# Patient Record
Sex: Male | Born: 1950
Health system: Southern US, Community
[De-identification: ages and names within clinical notes are randomized; demographics above are authoritative.]

## PROBLEM LIST (undated history)

## (undated) DIAGNOSIS — R42 Dizziness and giddiness: Secondary | ICD-10-CM

## (undated) DIAGNOSIS — T7840XA Allergy, unspecified, initial encounter: Secondary | ICD-10-CM

## (undated) DIAGNOSIS — K219 Gastro-esophageal reflux disease without esophagitis: Secondary | ICD-10-CM

## (undated) DIAGNOSIS — I10 Essential (primary) hypertension: Secondary | ICD-10-CM

## (undated) DIAGNOSIS — M1990 Unspecified osteoarthritis, unspecified site: Secondary | ICD-10-CM

## (undated) DIAGNOSIS — K921 Melena: Secondary | ICD-10-CM

## (undated) DIAGNOSIS — Z981 Arthrodesis status: Secondary | ICD-10-CM

## (undated) DIAGNOSIS — G4730 Sleep apnea, unspecified: Secondary | ICD-10-CM

## (undated) DIAGNOSIS — R0902 Hypoxemia: Secondary | ICD-10-CM

## (undated) DIAGNOSIS — I8390 Asymptomatic varicose veins of unspecified lower extremity: Secondary | ICD-10-CM

## (undated) DIAGNOSIS — R6882 Decreased libido: Secondary | ICD-10-CM

## (undated) DIAGNOSIS — F419 Anxiety disorder, unspecified: Secondary | ICD-10-CM

## (undated) DIAGNOSIS — M5412 Radiculopathy, cervical region: Secondary | ICD-10-CM

## (undated) DIAGNOSIS — C801 Malignant (primary) neoplasm, unspecified: Secondary | ICD-10-CM

## (undated) DIAGNOSIS — N529 Male erectile dysfunction, unspecified: Secondary | ICD-10-CM

## (undated) HISTORY — DX: Melena: K92.1

## (undated) HISTORY — DX: Radiculopathy, cervical region: M54.12

## (undated) HISTORY — DX: Essential (primary) hypertension: I10

## (undated) HISTORY — DX: Arthrodesis status: Z98.1

## (undated) HISTORY — DX: Male erectile dysfunction, unspecified: N52.9

## (undated) HISTORY — PX: SPINE SURGERY: SHX786

## (undated) HISTORY — DX: Sleep apnea, unspecified: G47.30

## (undated) HISTORY — DX: Unspecified osteoarthritis, unspecified site: M19.90

## (undated) HISTORY — DX: Dizziness and giddiness: R42

## (undated) HISTORY — DX: Gastro-esophageal reflux disease without esophagitis: K21.9

## (undated) HISTORY — DX: Hypoxemia: R09.02

## (undated) HISTORY — DX: Allergy, unspecified, initial encounter: T78.40XA

## (undated) HISTORY — DX: Anxiety disorder, unspecified: F41.9

## (undated) HISTORY — PX: ESOPHAGOGASTRODUODENOSCOPY ENDOSCOPY: SHX5814

## (undated) HISTORY — DX: Asymptomatic varicose veins of unspecified lower extremity: I83.90

## (undated) HISTORY — DX: Decreased libido: R68.82

---

## 1991-02-20 HISTORY — PX: CARDIAC CATHETERIZATION: SHX172

## 2003-02-20 HISTORY — PX: LAMINECTOMY: SHX219

## 2003-11-12 ENCOUNTER — Ambulatory Visit (HOSPITAL_COMMUNITY): Admission: RE | Admit: 2003-11-12 | Discharge: 2003-11-13 | Payer: Self-pay | Admitting: Neurosurgery

## 2004-01-11 ENCOUNTER — Encounter: Admission: RE | Admit: 2004-01-11 | Discharge: 2004-01-11 | Payer: Self-pay | Admitting: Neurosurgery

## 2005-05-13 ENCOUNTER — Emergency Department: Payer: Self-pay | Admitting: Emergency Medicine

## 2006-04-05 DIAGNOSIS — M545 Low back pain, unspecified: Secondary | ICD-10-CM | POA: Insufficient documentation

## 2006-05-08 ENCOUNTER — Ambulatory Visit: Payer: Self-pay

## 2007-02-20 HISTORY — PX: CERVICAL SPINE SURGERY: SHX589

## 2007-04-27 ENCOUNTER — Encounter: Admission: RE | Admit: 2007-04-27 | Discharge: 2007-04-27 | Payer: Self-pay | Admitting: Neurosurgery

## 2007-05-13 ENCOUNTER — Inpatient Hospital Stay (HOSPITAL_COMMUNITY): Admission: RE | Admit: 2007-05-13 | Discharge: 2007-05-14 | Payer: Self-pay | Admitting: Neurosurgery

## 2007-07-22 ENCOUNTER — Encounter: Admission: RE | Admit: 2007-07-22 | Discharge: 2007-07-22 | Payer: Self-pay | Admitting: Neurosurgery

## 2008-01-02 DIAGNOSIS — M5412 Radiculopathy, cervical region: Secondary | ICD-10-CM

## 2008-01-02 HISTORY — DX: Radiculopathy, cervical region: M54.12

## 2008-05-07 ENCOUNTER — Emergency Department: Payer: Self-pay | Admitting: Emergency Medicine

## 2010-01-20 ENCOUNTER — Ambulatory Visit: Payer: Self-pay

## 2010-07-04 NOTE — Op Note (Signed)
Benjamin Gutierrez, Benjamin Gutierrez NO.:  [id number]   MEDICAL RECORD NO.:  [id number]          PATIENT TYPE:  OIB   LOCATION:  3599                         FACILITY:  [facility]   PHYSICIAN:  Henry A. Pool, M.D.    DATE OF BIRTH:  [birth year]   DATE OF PROCEDURE:  05/12/2007  DATE OF DISCHARGE:                               OPERATIVE REPORT   PREOPERATIVE DIAGNOSIS:  C3-4 and C4-5 stenosis with myelopathy and  radiculopathy.   POSTOPERATIVE DIAGNOSIS:  C3-4 and C4-5 stenosis with myelopathy and  radiculopathy.   PROCEDURE NOTE:  Re-exploration of C5 through C7 anterior cervical  fusion with removal of C5 through 7 anterior plate instrumentation.  C3-  4 and C4-5 anterior cervical diskectomy and fusion with allograft and  plating.   SURGEON:  Kathaleen Maser. Pool, M.D.   ASSISTANT:  Reinaldo Meeker, M.D.   ANESTHESIA:  General endotracheal.   INDICATIONS:  Mr. Ragain is a 60 year old male with history of neck pain  and left upper extremity weakness consistent with acute left-sided C5  radiculopathy.  The patient has complete loss of his deltoid  supraspinatus and infraspinatus strength.  His biceps weakness on the  left side.  Remaining his neurological exam is intact.  Workup  demonstrates no evidence of solid fusion from C5 to C7.  The patient has  evidence of significant disk bulging and spondylosis with stenosis at C3-  4 and C4-5 and some degree of foraminal disk protrusions at C3-4 and C4-  5 on the left side.  I discussed the situation with the patient.  I have  recommended that we move forward with surgical decompression.  The  patient is aware the risks and benefits and wished to proceed.   OPERATION:  The patient was taken to the operating room, placed on the  operating table in supine position.  After adequate level of anesthesia  was achieved the patient positioned with his neck slightly extended and  held in place with halter traction.  The patient's anterior  cervical  region was prepped and draped sterilely.  A 10 blade was used make  linear skin incision overlying the C5-6 vertebral level.  This was  carried sharply to the platysma.  The platysma was divided vertically  and dissection proceeded along the medial border of the  sternocleidomastoid muscle and carotid sheath.  Trachea, esophagus were  mobilized and retracted towards the left.  Prevertebral fascia stripped  off the anterior spinal column.  Longus colli muscle was then elevated  bilaterally using electrocautery.  The previous anterior plate  instrumentation from C5 to C7 was dissected free.  The plate was then  disassembled and all screws removed.  Plate was completely removed.  Fusion from C5-C7 was found to be quite solid.  Anterior overhanging  osteophytes from the body of C4 was resected.  Disk spaces at C3-4, C4-5  were then incised with a 15 blade in rectangular fashion.  A wide disk  space clean-out was achieved using pituitary rongeurs, forward and  backward Karlen curettes, Kerrison rongeurs, and high-speed drill.  All  elements of the disk  were removed down to the level of the posterior  annulus.  Microscope was then brought to the field to use throughout the  remainder of the diskectomy.  The  remaining aspects of annulus and  osteophytes were removed using high-speed drill down to the level of the  posterior longitudinal ligament.  Posterior longitudinal ligament was  elevated and resected in piecemeal fashion using Kerrison rongeurs.  The  underlying thecal sac was identified.  Wide central decompression was  then performed by undercutting the bodies of C3 and C4.  Decompression  then proceeded out each neural foramen.  Wide anterior foraminotomies  were then performed along the course exiting C4 nerve roots bilaterally.  At this point a very thorough decompression had been achieved.  There  was no evidence of injury to thecal sac or nerve roots.  The procedure  then  repeated at C4-5 again without complication.  Findings at this  level where were that of a significant foraminal disk herniation off to  the left side at C4-5.  Life-Net allograft wedges were then packed into  place at C3-4 and C4-5.  A Atlantis anterior cervical plate was then  placed over the C3, C4 and C5 levels.  This was then attached under  fluoroscopic guidance using 13 mL variable angle screws, two each at all  three levels.  All six screws given final tightening and found to be  solid in bone.  Locking screws engaged at all three levels.  Cervical  spine images revealed good position of bone grafts and hardware at  proper operative level and normal alignment of the spine.  The wound was  then inspected for hemostasis which was found to be good.  It was then  irrigated and closed in typical fashion.  Steri-Strips and sterile  dressing were applied.  There were no apparent complications.  The  patient tolerated the procedure well and he returns to recovery room  postoperatively.           ______________________________  Kathaleen Maser Pool, M.D.     HAP/MEDQ  D:  05/12/2007  T:  05/13/2007  Job:  [id number]

## 2010-07-07 NOTE — Op Note (Signed)
Benjamin Gutierrez, Benjamin Gutierrez NO.:  [id number]   MEDICAL RECORD NO.:  [id number]          PATIENT TYPE:  OIB   LOCATION:  2899                         FACILITY:  [facility]   PHYSICIAN:  Kathaleen Maser. Pool, M.D.    DATE OF BIRTH:  [birth year]   DATE OF PROCEDURE:  11/12/2003  DATE OF DISCHARGE:                                 OPERATIVE REPORT   PREOPERATIVE DIAGNOSIS:  C5-6 and C6-7 stenosis with myelopathy.   POSTOPERATIVE DIAGNOSIS:  C5-6 and C6-7 stenosis with myelopathy.   PROCEDURE:  C5-6 and C6-7 anterior cervical decompression and fusion with  allograft and anterior plating.   SURGEON:  Kathaleen Maser. Pool, M.D.   ASSISTANT:  Reinaldo Meeker, M.D.   ANESTHESIA:  General endotracheal.   INDICATIONS:  [name] is a 60 year old male with history of neck and  bilateral upper extremity symptoms consistent with both cervical  radiculopathy and myelopathy.  Workup has demonstrated evidence of marked  spondylosis at C5-6 and C6-7 with spinal stenosis and cord compression.  The  patient presents now for two-level decompression and fusion with  instrumentation.   OPERATIVE NOTE:  The patient was taken to the operating room and placed on  the table in a supine position.  After an adequate level of anesthesia was  achieved, the patient was positioned supine with his neck slightly extended  and held in place with Holter traction.  The patient's anterior cervical  region was prepped and draped sterilely.  A 10 blade was used to make a  linear skin incision overlying the C6 vertebral level.  This was carried  down sharply to the platysma.  The platysma was then divided vertically and  dissection proceeded along the medial border of the sternocleidomastoid  muscle and carotid sheath.  Trachea and esophagus were mobilized and  retracted toward the left.  Prevertebral fascia was stripped off the  anterior spinal column.  The longus colli muscle was then elevated  bilaterally using  electrocautery.  A deep self-retaining retractor was  placed.  Intraoperative fluoroscopy was used, and the levels were confirmed.  The disk space at C5-6 and C6-7 were then both incised with a 15 blade in a  rectangular fashion.  A wide disk space clean-out was then achieved using  pituitary rongeurs, upward-angled pituitary rongeurs, and Epstein curettes.  All elements of the disk were removed down to the level of the posterior  annulus.  A microscope was brought into the field and used throughout the  remainder of the diskectomy.  The remaining aspects of the annulus and  osteophytes were removed using the high-speed drill down to the level of the  posterior longitudinal ligament, starting first at C5-6.  Posterior  longitudinal ligament was then elevated and resected in a piecemeal fashion  using Kerrison rongeurs.  The underlying thecal sac was identified.  A wide  central decompression then performed by undercutting the bodies of C5 and  C6.  Wide anterior foraminotomy was then performed along the course of the  exiting C6 nerve roots bilaterally.  All elements of the neural compression  were relieved.  There was no evidence of injury to thecal sac or nerve  roots.  The procedure was then repeated at C6-7, again without complication.  The wound was then irrigated with antibiotic solution.  Gelfoam was placed  topically for hemostasis and found to be good.  A 6 mm patellar wedge  allograft was then impacted into place, recessed approximately 1 mm from the  anterior cortical surface at C5-6, and a 7 mm patellar wedge allograft was  then packed into place and recessed approximately 1 mm from the anterior  cortical margin at C6-7.  A 42 mm Atlantis anterior cervical plate was then  placed over the C5, C6, and C7 levels.  This was then attached under  fluoroscopic guidance using 14 mm variable-angled screws, two each at C5 and  C6 and one on the left side at C7 and a 15 mm rescue screw on  the right side  at C7.  All six screws were given a final tightening and found to be solidly  within bone.  The locking screws were engaged at all three levels.  Final  images revealed good position of the bone grafts and hardware, proper  operative level, and normal alignment of the spine.  The wound was then  irrigated with antibiotic solution.  Gelfoam was placed topically.  Hemostasis was ensured with the bipolar electrocautery.  The wound was then  closed in a typical fashion.  Steri-Strips and sterile dressing were  applied.  There were no apparent complications.  The patient tolerated the  procedure well, and he returns to the recovery room postop.       HAP/MEDQ  D:  11/12/2003  T:  11/13/2003  Job:  [id number]

## 2010-11-13 LAB — CBC
HCT: 42
HCT: 48.1
Hemoglobin: 14.6
Hemoglobin: 16.4
MCHC: 34
MCHC: 34.8
MCV: 88.7
MCV: 89
Platelets: 167
Platelets: 180
RBC: 4.74
RBC: 5.41
RDW: 13.7
RDW: 13.8
WBC: 14.7 — ABNORMAL HIGH
WBC: 7.3

## 2010-11-13 LAB — DIFFERENTIAL
Basophils Absolute: 0
Basophils Relative: 0
Eosinophils Absolute: 0
Eosinophils Relative: 1
Lymphocytes Relative: 25
Lymphs Abs: 1.8
Monocytes Absolute: 0.6
Monocytes Relative: 8
Neutro Abs: 4.9
Neutrophils Relative %: 66

## 2010-11-13 LAB — BLOOD GAS, ARTERIAL
Acid-Base Excess: 2.8 — ABNORMAL HIGH
Bicarbonate: 26.8 — ABNORMAL HIGH
Drawn by: 280971
FIO2: 0.4
O2 Saturation: 91
Patient temperature: 97.8
TCO2: 28.1
pCO2 arterial: 40.3
pH, Arterial: 7.437
pO2, Arterial: 57 — ABNORMAL LOW

## 2010-11-13 LAB — BASIC METABOLIC PANEL
BUN: 10
BUN: 16
CO2: 28
CO2: 28
Calcium: 9.4
Calcium: 9.8
Chloride: 104
Chloride: 99
Creatinine, Ser: 0.93
Creatinine, Ser: 0.94
GFR calc Af Amer: >60
GFR calc Af Amer: >60
GFR calc non Af Amer: >60
GFR calc non Af Amer: >60
Glucose, Bld: 123 — ABNORMAL HIGH
Glucose, Bld: 91
Potassium: 4
Potassium: 4.6
Sodium: 137
Sodium: 139

## 2010-11-13 LAB — TYPE AND SCREEN
ABO/RH(D): O POS
Antibody Screen: NEGATIVE

## 2010-11-13 LAB — ABO/RH: ABO/RH(D): O POS

## 2011-02-20 HISTORY — PX: COLONOSCOPY: SHX174

## 2012-04-11 LAB — HM HEPATITIS C SCREENING LAB: HM Hepatitis Screen: NEGATIVE

## 2014-06-18 ENCOUNTER — Ambulatory Visit
Admit: 2014-06-18 | Disposition: A | Discharge status: Home / Self Care | Payer: Self-pay | Attending: Gastroenterology | Admitting: Gastroenterology

## 2014-06-18 LAB — HM COLONOSCOPY

## 2014-07-29 ENCOUNTER — Telehealth: Payer: Self-pay | Admitting: Family Medicine

## 2014-07-29 NOTE — Telephone Encounter (Signed)
Pt's wife Bethena Roys would like for Simona Huh to call her back. She wouldn't go into detail just that she would like to speak with Simona Huh. Thanks TNP

## 2014-07-30 NOTE — Telephone Encounter (Signed)
Husband having a flare of a rash Dr. Koleen Nimrod use to treat with a Nystatin/Triamcinolone cream. Requesting a refill. Recommended he schedule an appointment to document rash and should bring in tube of this cream to be sure it will be prescribed the same way. Wife agreed to schedule appointment in a week or two since he does not have any rash at the present.

## 2014-08-06 DIAGNOSIS — R42 Dizziness and giddiness: Secondary | ICD-10-CM | POA: Insufficient documentation

## 2014-08-06 DIAGNOSIS — K921 Melena: Secondary | ICD-10-CM | POA: Insufficient documentation

## 2014-08-06 DIAGNOSIS — N529 Male erectile dysfunction, unspecified: Secondary | ICD-10-CM | POA: Insufficient documentation

## 2014-08-06 DIAGNOSIS — Z981 Arthrodesis status: Secondary | ICD-10-CM

## 2014-08-06 DIAGNOSIS — I8390 Asymptomatic varicose veins of unspecified lower extremity: Secondary | ICD-10-CM

## 2014-08-06 DIAGNOSIS — K219 Gastro-esophageal reflux disease without esophagitis: Secondary | ICD-10-CM | POA: Insufficient documentation

## 2014-08-06 DIAGNOSIS — M19049 Primary osteoarthritis, unspecified hand: Secondary | ICD-10-CM | POA: Insufficient documentation

## 2014-08-06 DIAGNOSIS — R6882 Decreased libido: Secondary | ICD-10-CM | POA: Insufficient documentation

## 2014-08-06 DIAGNOSIS — E559 Vitamin D deficiency, unspecified: Secondary | ICD-10-CM | POA: Insufficient documentation

## 2014-08-06 DIAGNOSIS — F419 Anxiety disorder, unspecified: Secondary | ICD-10-CM | POA: Insufficient documentation

## 2014-08-06 HISTORY — DX: Melena: K92.1

## 2014-08-06 HISTORY — DX: Decreased libido: R68.82

## 2014-08-06 HISTORY — DX: Dizziness and giddiness: R42

## 2014-08-06 HISTORY — DX: Gastro-esophageal reflux disease without esophagitis: K21.9

## 2014-08-06 HISTORY — DX: Anxiety disorder, unspecified: F41.9

## 2014-08-06 HISTORY — DX: Male erectile dysfunction, unspecified: N52.9

## 2014-08-06 HISTORY — DX: Asymptomatic varicose veins of unspecified lower extremity: I83.90

## 2014-08-06 HISTORY — DX: Arthrodesis status: Z98.1

## 2014-08-09 ENCOUNTER — Encounter: Payer: Self-pay | Admitting: Family Medicine

## 2014-08-09 ENCOUNTER — Ambulatory Visit (INDEPENDENT_AMBULATORY_CARE_PROVIDER_SITE_OTHER): Payer: Managed Care, Other (non HMO) | Admitting: Family Medicine

## 2014-08-09 VITALS — BP 124/84 | HR 70 | Temp 98.5°F | Resp 16 | Wt 191.0 lb

## 2014-08-09 DIAGNOSIS — L219 Seborrheic dermatitis, unspecified: Secondary | ICD-10-CM | POA: Diagnosis not present

## 2014-08-09 DIAGNOSIS — L570 Actinic keratosis: Secondary | ICD-10-CM | POA: Diagnosis not present

## 2014-08-09 MED ORDER — NYSTATIN-TRIAMCINOLONE 100000-0.1 UNIT/GM-% EX CREA: 1.0000 "application " | TOPICAL_CREAM | Freq: Two times a day (BID) | CUTANEOUS | Status: DC

## 2014-08-09 NOTE — Progress Notes (Signed)
Subjective:    Patient ID: Benjamin Gutierrez, male    DOB: Jun 03, 1950, 64 y.o.   MRN: [id number]  HPI Rash on forehead and right temple scalp. Some soreness since going to the beach last week. No open sores or blisters. No sunburn. Has had same rash in the past treated with a cream and Nitrogen freezing by Dr. Koleen Nimrod.  Patient Active Problem List   Diagnosis Date Noted  . Osteoarthrosis, hand 08/06/2014  . Anxiety 08/06/2014  . Blood in feces 08/06/2014  . Acid reflux 08/06/2014  . ED (erectile dysfunction) of organic origin 08/06/2014  . Dizziness and giddiness 08/06/2014  . H/O arthrodesis 08/06/2014  . Decreased libido 08/06/2014  . Avitaminosis D 08/06/2014  . Leg varices 08/06/2014  . 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 LBP (low back pain) 04/05/2006   History  Substance Use Topics  . Smoking status: Never Smoker   . Smokeless tobacco: Not on file  . Alcohol Use: 0.0 oz/week    0 Standard drinks or equivalent per week     Comment: OCCASIONALLY   Family History  Problem Relation Age of Onset  . Bone cancer Mother   . Alcohol abuse Father   . Cancer Father     laryngeal cancer  . Bone cancer Brother   . Prostate cancer Brother   . Parkinson's disease Maternal Uncle   . Dementia Paternal Grandmother    Current Outpatient Prescriptions on File Prior to Visit  Medication Sig Dispense Refill  . Cholecalciferol (VITAMIN D3) 1000 UNITS CAPS Take 1 capsule by mouth daily.    Marland Kitchen ibuprofen (ADVIL,MOTRIN) 200 MG tablet Take 3 tablets by mouth every 4 (four) hours as needed.    . naproxen (EC NAPROSYN) 500 MG EC tablet Take 1 tablet by mouth daily.    . pantoprazole (PROTONIX) 40 MG tablet Take 1 tablet by mouth daily.     No current facility-administered medications on file prior to visit.   Allergies  Allergen Reactions  . Iodine Itching  . Penicillins   . Povidone Iodine Itching    Review of Systems  Constitutional: Negative.   HENT: Negative.   Respiratory:  Negative.   Cardiovascular: Negative.   Gastrointestinal: Negative.   Skin: Positive for rash.      BP 124/84 mmHg  Pulse 70  Temp(Src) 98.5 F (36.9 C) (Oral)  Resp 16  Wt 191 lb (86.637 kg)  Objective:   Physical Exam  Constitutional: He is oriented to person, place, and time. He appears well-developed and well-nourished. No distress.  HENT:  Head: Normocephalic and atraumatic.  Right Ear: Hearing normal.  Left Ear: Hearing normal.  Nose: Nose normal.  Eyes: Conjunctivae and lids are normal. Right eye exhibits no discharge. Left eye exhibits no discharge. No scleral icterus.  Pulmonary/Chest: Effort normal. No respiratory distress.  Musculoskeletal: Normal range of motion.  Neurological: He is alert and oriented to person, place, and time.  Skin: Skin is intact. Lesion and rash noted. Rash is maculopapular.     Psychiatric: He has a normal mood and affect. His speech is normal and behavior is normal. Thought content normal.      Assessment & Plan:  1. Seborrhea Flare of rash in the right temple hairline. Needs refill of cream dermatologist gave in the past. Seemed to work very well. - nystatin-triamcinolone (MYCOLOG II) cream; Apply 1 application topically 2 (two) times daily. As needed.  Dispense: 30 g; Refill: 0  2. Actinic keratosis of left side of forehead  Several lesions across forehead. Froze two lesion that are keratotic. May need referral to dermatologist if worsening and not responding to cryotherapy. Recheck prn. - Cryotherapy/destruct benign or premalignant lesion

## 2014-08-10 ENCOUNTER — Other Ambulatory Visit: Payer: Self-pay | Admitting: Family Medicine

## 2014-09-01 ENCOUNTER — Telehealth: Payer: Self-pay

## 2014-09-01 NOTE — Telephone Encounter (Signed)
[facility] 90 day supply of Pantoprazole 40 mg.

## 2014-09-02 MED ORDER — PANTOPRAZOLE SODIUM 40 MG PO TBEC: 40.0000 mg | DELAYED_RELEASE_TABLET | Freq: Every day | ORAL | Status: DC

## 2014-09-02 NOTE — Telephone Encounter (Signed)
90 day supply sent to pharmacy

## 2014-09-24 ENCOUNTER — Encounter: Payer: Self-pay | Admitting: Family Medicine

## 2014-09-24 ENCOUNTER — Ambulatory Visit (INDEPENDENT_AMBULATORY_CARE_PROVIDER_SITE_OTHER): Payer: PPO | Admitting: Family Medicine

## 2014-09-24 VITALS — BP 130/82 | HR 76 | Temp 98.2°F | Resp 16 | Wt 189.0 lb

## 2014-09-24 DIAGNOSIS — N401 Enlarged prostate with lower urinary tract symptoms: Secondary | ICD-10-CM

## 2014-09-24 DIAGNOSIS — R351 Nocturia: Secondary | ICD-10-CM | POA: Diagnosis not present

## 2014-09-24 DIAGNOSIS — R3912 Poor urinary stream: Secondary | ICD-10-CM | POA: Diagnosis not present

## 2014-09-24 DIAGNOSIS — N528 Other male erectile dysfunction: Secondary | ICD-10-CM

## 2014-09-24 DIAGNOSIS — N451 Epididymitis: Secondary | ICD-10-CM | POA: Diagnosis not present

## 2014-09-24 DIAGNOSIS — N529 Male erectile dysfunction, unspecified: Secondary | ICD-10-CM

## 2014-09-24 LAB — POCT URINALYSIS DIPSTICK
Bilirubin, UA: NEGATIVE
Blood, UA: NEGATIVE
Glucose, UA: NEGATIVE
Ketones, UA: NEGATIVE
Leukocytes, UA: NEGATIVE
Nitrite, UA: NEGATIVE
Protein, UA: NEGATIVE
Spec Grav, UA: 1.015
Urobilinogen, UA: 0.2
pH, UA: 6

## 2014-09-24 MED ORDER — TADALAFIL 5 MG PO TABS
5.0000 mg | ORAL_TABLET | Freq: Every day | ORAL | Status: DC
Start: 2014-09-24 — End: 2014-12-09

## 2014-09-24 MED ORDER — DOXYCYCLINE HYCLATE 100 MG PO TABS
100.0000 mg | ORAL_TABLET | Freq: Two times a day (BID) | ORAL | Status: DC
Start: 2014-09-24 — End: 2014-10-18

## 2014-09-24 NOTE — Progress Notes (Signed)
Patient ID: Benjamin Gutierrez, male   DOB: 25-Dec-1950, 64 y.o.   MRN: [id number]   Patient: Benjamin Gutierrez Male    DOB: Nov 16, 1950   64 y.o.   MRN: [id number] Visit Date: 09/24/2014  Today's Provider: Vernie Murders, PA   Chief Complaint  Patient presents with  . Urinary Tract Infection   Subjective:    HPI  This 64 year old male has been having a toothache pain in the right testicle over the past 2 weeks. No known injury, swelling, urgency, burning or hematuria. Decreased stream, frequency and nocturia twice a night. No history of vasectomy or hernias. Feels erectile dysfunction with inability to maintain erections through intercourse. PSA was 1.0 at physical on 05-28-14 with normal exam.  Patient Active Problem List   Diagnosis Date Noted  . Osteoarthrosis, hand 08/06/2014  . Anxiety 08/06/2014  . Blood in feces 08/06/2014  . Acid reflux 08/06/2014  . ED (erectile dysfunction) of organic origin 08/06/2014  . Dizziness and giddiness 08/06/2014  . H/O arthrodesis 08/06/2014  . Decreased libido 08/06/2014  . Avitaminosis D 08/06/2014  . Leg varices 08/06/2014  . 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 LBP (low back pain) 04/05/2006   Family History  Problem Relation Age of Onset  . Bone cancer Mother   . Alcohol abuse Father   . Cancer Father     laryngeal cancer  . Bone cancer Brother   . Prostate cancer Brother   . Parkinson's disease Maternal Uncle   . Dementia Paternal Grandmother    Past Surgical History  Procedure Laterality Date  . Laminectomy  2005  . Cardiac catheterization  1993  . Cervical spine surgery  2009    radiculopathy from past fusion and arthritic changes  . Colonoscopy  2013     Previous Medications   CHOLECALCIFEROL (VITAMIN D3) 1000 UNITS CAPS    Take 1 capsule by mouth daily.   IBUPROFEN (ADVIL,MOTRIN) 200 MG TABLET    Take 3 tablets by mouth every 4 (four) hours as needed.   NAPROXEN (EC NAPROSYN) 500 MG EC TABLET    Take 1 tablet by mouth daily.   NYSTATIN-TRIAMCINOLONE (MYCOLOG II) CREAM    Apply 1 application topically 2 (two) times daily. As needed.   Review of Systems  Constitutional: Negative.   HENT: Negative.   Respiratory: Negative.   Cardiovascular: Negative.   Gastrointestinal: Negative.   Genitourinary: Positive for urgency, frequency and testicular pain. Negative for dysuria, hematuria, penile swelling and scrotal swelling.    History  Substance Use Topics  . Smoking status: Never Smoker   . Smokeless tobacco: Not on file  . Alcohol Use: 0.0 oz/week    0 Standard drinks or equivalent per week     Comment: OCCASIONALLY   Objective:   BP 130/82 mmHg  Pulse 76  Temp(Src) 98.2 F (36.8 C) (Oral)  Resp 16  Wt 189 lb (85.73 kg)  Physical Exam      Assessment & Plan:      1. Frequent urination at night Onset over the past couple months with decrease in urine stream pressure. Urinalysis clear without signs of infection. Suspect secondary to BPH. Will treat with Cialis 5 mg qd. Recheck in 1 month to check progress.  2. Epididymitis Onset over the past 2 weeks with posterior tenderness in the right testicle. Will treat with Doxycycline and recheck prn in a week. - POCT urinalysis dipstick - doxycycline (VIBRA-TABS) 100 MG tablet; Take 1 tablet (100 mg total) by mouth  2 (two) times daily.  Dispense: 20 tablet; Refill: 0  3. Benign prostatic hypertrophy (BPH) with weak urinary stream Nocturia and weak urine stream with ED. Will treat with Cialis 5 mg qd. Recheck in a month - tadalafil (CIALIS) 5 MG tablet; Take 1 tablet (5 mg total) by mouth daily.  Dispense: 30 tablet; Refill: 3  4. ED (erectile dysfunction) of organic origin Inability to maintain erections during intercourse. Will treat with Cialis. - tadalafil (CIALIS) 5 MG tablet; Take 1 tablet (5 mg total) by mouth daily.  Dispense: 30 tablet; Refill: 3

## 2014-10-10 ENCOUNTER — Encounter: Payer: Self-pay | Admitting: Family Medicine

## 2014-10-18 ENCOUNTER — Ambulatory Visit (INDEPENDENT_AMBULATORY_CARE_PROVIDER_SITE_OTHER): Payer: PPO | Admitting: Family Medicine

## 2014-10-18 ENCOUNTER — Encounter: Payer: Self-pay | Admitting: Family Medicine

## 2014-10-18 VITALS — BP 104/82 | HR 82 | Temp 98.2°F | Resp 16 | Wt 189.0 lb

## 2014-10-18 DIAGNOSIS — N529 Male erectile dysfunction, unspecified: Secondary | ICD-10-CM

## 2014-10-18 DIAGNOSIS — R351 Nocturia: Secondary | ICD-10-CM

## 2014-10-18 DIAGNOSIS — N528 Other male erectile dysfunction: Secondary | ICD-10-CM

## 2014-10-18 DIAGNOSIS — N451 Epididymitis: Secondary | ICD-10-CM

## 2014-10-18 MED ORDER — DOXYCYCLINE HYCLATE 100 MG PO TABS: 100.0000 mg | ORAL_TABLET | Freq: Two times a day (BID) | ORAL | Status: DC

## 2014-10-18 NOTE — Progress Notes (Signed)
Subjective:    Patient ID: Benjamin Gutierrez, male    DOB: 1950-07-13, 64 y.o.   MRN: [id number]  HPI  This 64 year old male presents for follow up of nocturia secondary to BPH and ED that has been complicated by a recent flare of epididymitis. Responded well to the Doxycycline 100 mg BID and Cialis 5 mg qd. No fever, hematuria or purulent discharge. Occasionally has some slight dysuria when he drinks excess tea. Less nocturia (down from 4-5 times a night to 1-2 times a night) when on the Cialis daily. Better erectile function with the Cialis, also.  Patient Active Problem List   Diagnosis Date Noted  . Osteoarthrosis, hand 08/06/2014  . Anxiety 08/06/2014  . Blood in feces 08/06/2014  . Acid reflux 08/06/2014  . ED (erectile dysfunction) of organic origin 08/06/2014  . Dizziness and giddiness 08/06/2014  . H/O arthrodesis 08/06/2014  . Decreased libido 08/06/2014  . Avitaminosis D 08/06/2014  . Leg varices 08/06/2014  . 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 LBP (low back pain) 04/05/2006   Past Surgical History  Procedure Laterality Date  . Laminectomy  2005  . Cardiac catheterization  1993  . Cervical spine surgery  2009    radiculopathy from past fusion and arthritic changes  . Colonoscopy  2013   Family History  Problem Relation Age of Onset  . Bone cancer Mother   . Alcohol abuse Father   . Cancer Father     laryngeal cancer  . Bone cancer Brother   . Prostate cancer Brother   . Parkinson's disease Maternal Uncle   . Dementia Paternal Grandmother    Allergies  Allergen Reactions  . Iodine Itching  . Penicillins   . Povidone Iodine Itching   Current Outpatient Prescriptions on File Prior to Visit  Medication Sig Dispense Refill  . Cholecalciferol (VITAMIN D3) 1000 UNITS CAPS Take 1 capsule by mouth daily.    Marland Kitchen ibuprofen (ADVIL,MOTRIN) 200 MG tablet Take 3 tablets by mouth every 4 (four) hours as needed.    . naproxen (EC NAPROSYN) 500 MG EC tablet Take 1 tablet by  mouth daily.    Marland Kitchen nystatin-triamcinolone (MYCOLOG II) cream Apply 1 application topically 2 (two) times daily. As needed. 30 g 0  . tadalafil (CIALIS) 5 MG tablet Take 1 tablet (5 mg total) by mouth daily. 30 tablet 3   No current facility-administered medications on file prior to visit.   Social History   Social History  . Marital Status: Married    Spouse Name: N/A  . Number of Children: N/A  . Years of Education: N/A   Occupational History  . Not on file.   Social History Main Topics  . Smoking status: Never Smoker   . Smokeless tobacco: Never Used  . Alcohol Use: 0.0 oz/week    0 Standard drinks or equivalent per week     Comment: OCCASIONALLY  . Drug Use: No  . Sexual Activity: Not on file   Other Topics Concern  . Not on file   Social History Narrative   Review of Systems  Constitutional: Negative.   Cardiovascular: Negative.   Gastrointestinal: Negative.   Genitourinary: Positive for frequency and testicular pain. Negative for hematuria, discharge, scrotal swelling and penile pain.       Nocturia 4-5 times a night when he does not take the Cialis 5 mg qd. Erectile function is better with it, also.     BP 104/82 mmHg  Pulse 82  Temp(Src)  98.2 F (36.8 C) (Oral)  Resp 16  Wt 189 lb (85.73 kg)  Objective:   Physical Exam  Constitutional: He is oriented to person, place, and time. He appears well-developed and well-nourished. No distress.  HENT:  Head: Normocephalic and atraumatic.  Right Ear: Hearing normal.  Left Ear: Hearing normal.  Nose: Nose normal.  Eyes: Conjunctivae and lids are normal. Right eye exhibits no discharge. Left eye exhibits no discharge. No scleral icterus.  Pulmonary/Chest: Effort normal. No respiratory distress.  Abdominal: Soft. Bowel sounds are normal. Hernia confirmed negative in the right inguinal area and confirmed negative in the left inguinal area.  Genitourinary: Penis normal. Right testis shows tenderness. Right testis shows  no mass and no swelling. Left testis shows no mass, no swelling and no tenderness. Circumcised. No penile tenderness. No discharge found.  Tenderness and soreness posterior right testicle epididymus  Musculoskeletal: Normal range of motion.  Lymphadenopathy:       Right: No inguinal adenopathy present.       Left: No inguinal adenopathy present.  Neurological: He is alert and oriented to person, place, and time.  Skin: Skin is intact. No lesion and no rash noted.  Psychiatric: He has a normal mood and affect. His speech is normal and behavior is normal. Thought content normal.      Assessment & Plan:   1. Epididymitis Recurrence of tenderness/soreness behind right testicle over the past week. Was better when taking the Doxycycline. Hot tube baths/soaks helps in the mornings. Urinalysis showed a trace of blood on dipstick but very few on microscopic exam. Refilled Doxycycline 100 mg BID for 2 weeks. If no improvement, will need to schedule urology referral. - doxycycline (VIBRA-TABS) 100 MG tablet; Take 1 tablet (100 mg total) by mouth 2 (two) times daily.  Dispense: 30 tablet; Refill: 0  2. Nocturia Suspect secondary to BPH. Improved with the use of Cialis 5 mg qd but not sure he wants to continue this due to cost and not fully covered by his insurance. Found a coupon for discount and will continue for now. - POCT urinalysis dipstick  3. ED (erectile dysfunction) of organic origin Much improved function with the use of Cialis 5 mg qd.

## 2014-10-29 ENCOUNTER — Telehealth: Payer: Self-pay | Admitting: Family Medicine

## 2014-10-29 DIAGNOSIS — N451 Epididymitis: Secondary | ICD-10-CM

## 2014-10-29 NOTE — Telephone Encounter (Signed)
Pt is having symptoms.  Doesn't feel that the antobiotic is helping.  Thinks he may need a referral to urologist.     Please call [phone]  Thanks Con Memos

## 2014-10-29 NOTE — Telephone Encounter (Signed)
Patient reports he has been taking the antibiotics prescribed on 8/29, and he has 10 tablets left. Patient states he is still has the same symptoms, and feels the antibiotics has not helped much. Patient is requesting a referral to urologist if Simona Huh recommends. Please advise.

## 2014-10-31 ENCOUNTER — Other Ambulatory Visit: Payer: Self-pay | Admitting: Family Medicine

## 2014-11-01 NOTE — Telephone Encounter (Signed)
Pt is calling back for advise.  CB#[phone]/MW

## 2014-11-01 NOTE — Telephone Encounter (Signed)
Refilled Doxycycline and in the process of scheduling urology referral.

## 2014-11-01 NOTE — Telephone Encounter (Signed)
Will refill Doxycycline but in the process of scheduling urology referral. Will let him know when the appointment is scheduled.

## 2014-11-02 NOTE — Telephone Encounter (Signed)
Patient advised as directed below. Patient states he has been notified of urology appointment.

## 2014-11-05 ENCOUNTER — Ambulatory Visit (INDEPENDENT_AMBULATORY_CARE_PROVIDER_SITE_OTHER): Payer: PPO | Admitting: Urology

## 2014-11-05 ENCOUNTER — Encounter: Payer: Self-pay | Admitting: Urology

## 2014-11-05 VITALS — BP 153/89 | HR 82 | Ht 68.0 in | Wt 189.5 lb

## 2014-11-05 DIAGNOSIS — N451 Epididymitis: Secondary | ICD-10-CM | POA: Diagnosis not present

## 2014-11-05 LAB — MICROSCOPIC EXAMINATION
Bacteria, UA: NONE SEEN
Epithelial Cells (non renal): NONE SEEN /hpf (ref 0–10)
RBC, UA: NONE SEEN /hpf (ref 0–?)
WBC UA: NONE SEEN /HPF (ref 0–?)

## 2014-11-05 LAB — URINALYSIS, COMPLETE
Bilirubin, UA: NEGATIVE
GLUCOSE, UA: NEGATIVE
Ketones, UA: NEGATIVE
Leukocytes, UA: NEGATIVE
Nitrite, UA: NEGATIVE
PROTEIN UA: NEGATIVE
Specific Gravity, UA: 1.02 (ref 1.005–1.030)
UUROB: 0.2 mg/dL (ref 0.2–1.0)
pH, UA: 5.5 (ref 5.0–7.5)

## 2014-11-05 MED ORDER — CIPROFLOXACIN HCL 500 MG PO TABS: 500.0000 mg | ORAL_TABLET | Freq: Two times a day (BID) | ORAL | Status: DC

## 2014-11-05 NOTE — Progress Notes (Signed)
11/05/2014 11:02 AM   Benjamin Gutierrez 03-25-1950 [id number]  Referring provider: Margo Common, [place] [address] [place], [place] [address]  Chief Complaint  Patient presents with  . Epididymitis    New Patient    HPI: The patient is a 64 year old gentleman presenting for evaluation of right epididymitis. He has been on doxycycline for 3 weeks. He is noted minimal improvement. He is still taking Aleve daily. Denies any fevers, chills, nausea, or vomiting. His urinalysis today is negative. In terms of other urological issues, he notes nocturia 2, frequency, and occasional dysuria. He also has erectile dysfunction. For his BPH and ED, he has been on Cialis 5 mg. He stopped taking this recently when he developed the pain in his right testicle. He is worried that he may not be able afford the medication in the future.   PMH: Past Medical History  Diagnosis Date  . GERD (gastroesophageal reflux disease)   . Arthritis     Surgical History: Past Surgical History  Procedure Laterality Date  . Laminectomy  2005  . Cardiac catheterization  1993  . Cervical spine surgery  2009    radiculopathy from past fusion and arthritic changes  . Colonoscopy  2013    Home Medications:    Medication List       This list is accurate as of: 11/05/14 11:02 AM.  Always use your most recent med list.               ciprofloxacin 500 MG tablet  Commonly known as:  CIPRO  Take 1 tablet (500 mg total) by mouth every 12 (twelve) hours.     doxycycline 100 MG tablet  Commonly known as:  VIBRA-TABS  TAKE 1 TABLET (100 MG TOTAL) BY MOUTH 2 (TWO) TIMES DAILY.     ibuprofen 200 MG tablet  Commonly known as:  ADVIL,MOTRIN  Take 3 tablets by mouth every 4 (four) hours as needed.     naproxen 500 MG EC tablet  Commonly known as:  EC NAPROSYN  Take 1 tablet by mouth daily.     nystatin-triamcinolone cream  Commonly known as:  MYCOLOG II  Apply 1 application topically 2 (two) times  daily. As needed.     tadalafil 5 MG tablet  Commonly known as:  CIALIS  Take 1 tablet (5 mg total) by mouth daily.     Vitamin D3 1000 UNITS Caps  Take 1 capsule by mouth daily.        Allergies:  Allergies  Allergen Reactions  . Iodine Itching  . Penicillins   . Povidone Iodine Itching    Family History: Family History  Problem Relation Age of Onset  . Bone cancer Mother   . Alcohol abuse Father   . Cancer Father     laryngeal cancer  . Bone cancer Brother   . Prostate cancer Brother   . Parkinson's disease Maternal Uncle   . Dementia Paternal Grandmother     Social History:  reports that he has never smoked. He has never used smokeless tobacco. He reports that he drinks alcohol. He reports that he does not use illicit drugs.  ROS: UROLOGY Frequent Urination?: Yes Hard to postpone urination?: No Burning/pain with urination?: Yes Get up at night to urinate?: Yes Leakage of urine?: No Urine stream starts and stops?: No Trouble starting stream?: No Do you have to strain to urinate?: No Blood in urine?: No Urinary tract infection?: No Sexually transmitted disease?: No Injury to kidneys  or bladder?: No Painful intercourse?: No Weak stream?: Yes Erection problems?: Yes Penile pain?: No  Gastrointestinal Nausea?: No Vomiting?: No Indigestion/heartburn?: Yes Diarrhea?: No Constipation?: No  Constitutional Fever: No Night sweats?: No Weight loss?: No Fatigue?: No  Skin Skin rash/lesions?: No Itching?: No  Eyes Blurred vision?: No Double vision?: No  Ears/Nose/Throat Sore throat?: No Sinus problems?: No  Hematologic/Lymphatic Swollen glands?: No Easy bruising?: No  Cardiovascular Leg swelling?: No Chest pain?: No  Respiratory Cough?: No Shortness of breath?: No  Endocrine Excessive thirst?: No  Musculoskeletal Back pain?: Yes Joint pain?: Yes  Neurological Headaches?: No Dizziness?: No  Psychologic Depression?:  No Anxiety?: No  Physical Exam: BP 153/89 mmHg  Pulse 82  Ht 5\' 8"  (1.727 m)  Wt 189 lb 8 oz (85.957 kg)  BMI 28.82 kg/m2  Constitutional:  Alert and oriented, No acute distress. HEENT: [place] AT, moist mucus membranes.  Trachea midline, no masses. Cardiovascular: No clubbing, cyanosis, or edema. Respiratory: Normal respiratory effort, no increased work of breathing. GI: Abdomen is soft, nontender, nondistended, no abdominal masses GU: No CVA tenderness. Normal phallus. Right testicle tender to palpation. Minimal edema. Left testicle normal. DRE: 1+, smooth, no nodules. No sign of prostatitis. Skin: No rashes, bruises or suspicious lesions. Lymph: No cervical or inguinal adenopathy. Neurologic: Grossly intact, no focal deficits, moving all 4 extremities. Psychiatric: Normal mood and affect.  Laboratory Data: Lab Results  Component Value Date   WBC 14.7* 05/13/2007   HGB 14.6 05/13/2007   HCT 42.0 05/13/2007   MCV 88.7 05/13/2007   PLT 180 05/13/2007    Lab Results  Component Value Date   CREATININE 0.94 05/13/2007    No results found for: PSA  No results found for: TESTOSTERONE  No results found for: HGBA1C  Urinalysis    Component Value Date/Time   BILIRUBINUR neg 09/24/2014 1123   PROTEINUR neg 09/24/2014 1123   UROBILINOGEN 0.2 09/24/2014 1123   NITRITE neg 09/24/2014 1123   LEUKOCYTESUR Negative 09/24/2014 1123     Assessment & Plan:   On physical exam, the patient has tenderness but minimal swelling.  I think it would be reasonable to try a course of Cipro as the patient has been on doxycycline without much improvement. I'm not fully convinced that this is true epididymitis, so we will get an ultrasound to make sure there is no other pathology in the scrotum. In regards to his ED and BPH, we discussed cheaper medications including Flomax and generic sildenafil. He will investigate how much Cialis will cost without the manufacturer's coupon, and we will address  any medication changes at his next visit.   1. Right Epididymitis -Cipro 500 mg BID x 14 days -urine culture -testicular ultrasound  2. BPH -continue cialis 5 mg daily  3. ED -continue cialis 5 mg daily  Return in about 4 weeks (around 12/03/2014).  Nickie Retort, MD  [facility] [address] [place], [place] [address] [phone]

## 2014-11-07 LAB — CULTURE, URINE COMPREHENSIVE

## 2014-11-09 ENCOUNTER — Other Ambulatory Visit: Payer: Self-pay | Admitting: Urology

## 2014-11-09 DIAGNOSIS — N453 Epididymo-orchitis: Secondary | ICD-10-CM

## 2014-11-12 ENCOUNTER — Ambulatory Visit
Admission: RE | Admit: 2014-11-12 | Discharge: 2014-11-12 | Disposition: A | Discharge status: Home / Self Care | Payer: PPO | Source: Ambulatory Visit | Attending: Urology | Admitting: Urology

## 2014-11-12 DIAGNOSIS — I861 Scrotal varices: Secondary | ICD-10-CM | POA: Diagnosis not present

## 2014-11-12 DIAGNOSIS — N451 Epididymitis: Secondary | ICD-10-CM

## 2014-11-12 DIAGNOSIS — N453 Epididymo-orchitis: Secondary | ICD-10-CM

## 2014-11-24 ENCOUNTER — Ambulatory Visit: Payer: PPO | Admitting: Urology

## 2014-12-09 ENCOUNTER — Ambulatory Visit (INDEPENDENT_AMBULATORY_CARE_PROVIDER_SITE_OTHER): Payer: PPO | Admitting: Urology

## 2014-12-09 ENCOUNTER — Encounter: Payer: Self-pay | Admitting: Urology

## 2014-12-09 VITALS — BP 126/80 | HR 71 | Ht 68.0 in | Wt 191.5 lb

## 2014-12-09 DIAGNOSIS — R3912 Poor urinary stream: Secondary | ICD-10-CM

## 2014-12-09 DIAGNOSIS — N401 Enlarged prostate with lower urinary tract symptoms: Secondary | ICD-10-CM

## 2014-12-09 DIAGNOSIS — I861 Scrotal varices: Secondary | ICD-10-CM

## 2014-12-09 DIAGNOSIS — N528 Other male erectile dysfunction: Secondary | ICD-10-CM

## 2014-12-09 DIAGNOSIS — N529 Male erectile dysfunction, unspecified: Secondary | ICD-10-CM

## 2014-12-09 MED ORDER — TADALAFIL 5 MG PO TABS: 5.0000 mg | ORAL_TABLET | Freq: Every day | ORAL | Status: DC

## 2014-12-09 NOTE — Progress Notes (Signed)
12/09/2014 8:53 AM   Benjamin Gutierrez [birth year] [id number]  Referring provider: Margo Common, [place] [address] [place], [place] [address]  Chief Complaint  Patient presents with  . Follow-up    Epididymitis     HPI: The patient is a 64 year old gentleman presenting for evaluation of right epididymitis. He has been on doxycycline for 3 weeks. He is noted minimal improvement. He is still taking Aleve daily. Denies any fevers, chills, nausea, or vomiting. His urinalysis today is negative. In terms of other urological issues, he notes nocturia 2, frequency, and occasional dysuria. He also has erectile dysfunction. For his BPH and ED, he has been on Cialis 5 mg. He stopped taking this recently when he developed the pain in his right testicle. He is worried that he may not be able afford the medication in the future.  Interval History: The patient follows up for his right epididymitis.  He says the pain has resolved. U/S shows right varicocele otherwise normal. He has no urinary complaints.  He has been able to obtain cialis.  It is working well.  Nocturia x1. He is happy with his urinary quality of life. He is able to get erections capable penetration is happy with this.   PMH: Past Medical History  Diagnosis Date  . GERD (gastroesophageal reflux disease)   . Arthritis   . Leg varices 08/06/2014  . Acid reflux 08/06/2014  . 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 ED (erectile dysfunction) of organic origin 08/06/2014  . Anxiety 08/06/2014  . Blood in feces 08/06/2014  . Dizziness and giddiness 08/06/2014  . H/O arthrodesis 08/06/2014  . Decreased libido 08/06/2014    Surgical History: Past Surgical History  Procedure Laterality Date  . Laminectomy  2005  . Cardiac catheterization  1993  . Cervical spine surgery  2009    radiculopathy from past fusion and arthritic changes  . Colonoscopy  2013    Home Medications:    Medication List       This list is accurate as of:  12/09/14  8:53 AM.  Always use your most recent med list.               ciprofloxacin 500 MG tablet  Commonly known as:  CIPRO  Take 1 tablet (500 mg total) by mouth every 12 (twelve) hours.     doxycycline 100 MG tablet  Commonly known as:  VIBRA-TABS  TAKE 1 TABLET (100 MG TOTAL) BY MOUTH 2 (TWO) TIMES DAILY.     FLUZONE HIGH-DOSE 0.5 ML Susy  Generic drug:  Influenza Vac Split High-Dose  TO BE ADMINISTERED BY PHARMACIST FOR IMMUNIZATION     ibuprofen 200 MG tablet  Commonly known as:  ADVIL,MOTRIN  Take 3 tablets by mouth every 4 (four) hours as needed.     naproxen 500 MG EC tablet  Commonly known as:  EC NAPROSYN  Take 1 tablet by mouth daily.     nystatin-triamcinolone cream  Commonly known as:  MYCOLOG II  Apply 1 application topically 2 (two) times daily. As needed.     pantoprazole 40 MG tablet  Commonly known as:  PROTONIX  TAKE 1 TABLET (40 MG TOTAL) BY MOUTH DAILY.     tadalafil 5 MG tablet  Commonly known as:  CIALIS  Take 1 tablet (5 mg total) by mouth daily.     Vitamin D3 1000 UNITS Caps  Take 1 capsule by mouth daily.        Allergies:  Allergies  Allergen  Reactions  . Iodine Itching  . Penicillins   . Povidone Iodine Itching    Family History: Family History  Problem Relation Age of Onset  . Bone cancer Mother   . Alcohol abuse Father   . Cancer Father     laryngeal cancer  . Bone cancer Brother   . Prostate cancer Brother   . Parkinson's disease Maternal Uncle   . Dementia Paternal Grandmother     Social History:  reports that he has never smoked. He has never used smokeless tobacco. He reports that he drinks alcohol. He reports that he does not use illicit drugs.  ROS: UROLOGY Frequent Urination?: Yes Hard to postpone urination?: No Burning/pain with urination?: Yes Get up at night to urinate?: Yes Leakage of urine?: No Urine stream starts and stops?: No Trouble starting stream?: No Do you have to strain to urinate?:  No Blood in urine?: No Urinary tract infection?: No Sexually transmitted disease?: No Injury to kidneys or bladder?: No Painful intercourse?: No Weak stream?: No Erection problems?: Yes Penile pain?: No  Gastrointestinal Nausea?: No Vomiting?: No Indigestion/heartburn?: No Diarrhea?: No Constipation?: No  Constitutional Fever: No Night sweats?: No Weight loss?: No Fatigue?: No  Skin Skin rash/lesions?: No Itching?: No  Eyes Blurred vision?: No Double vision?: No  Ears/Nose/Throat Sore throat?: No Sinus problems?: No  Hematologic/Lymphatic Swollen glands?: No Easy bruising?: No  Cardiovascular Leg swelling?: No Chest pain?: No  Respiratory Cough?: No Shortness of breath?: No  Endocrine Excessive thirst?: No  Musculoskeletal Back pain?: No Joint pain?: No  Neurological Headaches?: No Dizziness?: No  Psychologic Depression?: No Anxiety?: No  Physical Exam: BP 126/80 mmHg  Pulse 71  Ht 5\' 8"  (1.727 m)  Wt 191 lb 8 oz (86.864 kg)  BMI 29.12 kg/m2  Constitutional:  Alert and oriented, No acute distress. HEENT: [place] AT, moist mucus membranes.  Trachea midline, no masses. Cardiovascular: No clubbing, cyanosis, or edema. Respiratory: Normal respiratory effort, no increased work of breathing. GI: Abdomen is soft, nontender, nondistended, no abdominal masses GU: No CVA tenderness. Normal phallus. Testicles descended equally bilaterally. Nontender palpation. Right varicocele palpated. Skin: No rashes, bruises or suspicious lesions. Lymph: No cervical or inguinal adenopathy. Neurologic: Grossly intact, no focal deficits, moving all 4 extremities. Psychiatric: Normal mood and affect.  Laboratory Data: Lab Results  Component Value Date   WBC 14.7* 05/13/2007   HGB 14.6 05/13/2007   HCT 42.0 05/13/2007   MCV 88.7 05/13/2007   PLT 180 05/13/2007    Lab Results  Component Value Date   CREATININE 0.94 05/13/2007    No results found for:  PSA  No results found for: TESTOSTERONE  No results found for: HGBA1C  Urinalysis    Component Value Date/Time   GLUCOSEU Negative 11/05/2014 1031   BILIRUBINUR Negative 11/05/2014 1031   BILIRUBINUR neg 09/24/2014 1123   PROTEINUR neg 09/24/2014 1123   UROBILINOGEN 0.2 09/24/2014 1123   NITRITE Negative 11/05/2014 1031   NITRITE neg 09/24/2014 1123   LEUKOCYTESUR Negative 11/05/2014 1031   LEUKOCYTESUR Negative 09/24/2014 1123    Pertinent Imaging: CLINICAL DATA: Epididymitis, right testicular pain x6 weeks  EXAM: SCROTAL ULTRASOUND  DOPPLER ULTRASOUND OF THE TESTICLES  TECHNIQUE: Complete ultrasound examination of the testicles, epididymis, and other scrotal structures was performed. Color and spectral Doppler ultrasound were also utilized to evaluate blood flow to the testicles.  COMPARISON: None.  FINDINGS: Right testicle  Measurements: 3.9 x 2.2 x 2.8 cm. No mass or microlithiasis visualized.  Left testicle  Measurements: 4.4 x 2.3 x 2.4 cm. No mass or microlithiasis visualized.  Right epididymis: Normal in size and appearance.  Left epididymis: Normal in size and appearance.  Hydrocele: None visualized.  Varicocele: Right scrotal varicocele.  Pulsed Doppler interrogation of both testes demonstrates normal low resistance arterial and venous waveforms bilaterally.  IMPRESSION: Normal sonographic appearance of the bilateral testes.  Right scrotal varicocele.  No evidence of testicular torsion.   Assessment & Plan:   1. Benign prostatic hypertrophy (BPH) with weak urinary stream - tadalafil (CIALIS) 5 MG tablet; Take 1 tablet (5 mg total) by mouth daily.  Dispense: 30 tablet; Refill: 11 -check PVR, IPSS, uroflow in 3 months  2. ED (erectile dysfunction) of organic origin - tadalafil (CIALIS) 5 MG tablet; Take 1 tablet (5 mg total) by mouth daily.  Dispense: 30 tablet; Refill: 11  3. Right varicocele -We'll obtain a  renal ultrasound to rule out any renal pathology as a right-sided varicocele is uncommon. We'll call patient with results of this if it is normal.   Return in about 3 months (around 03/11/2015) for IPSS, uroflow, PVR.  Nickie Retort, MD  [facility] [address] [place], [place] [address] [phone]

## 2014-12-28 ENCOUNTER — Other Ambulatory Visit: Payer: Self-pay

## 2014-12-28 DIAGNOSIS — I861 Scrotal varices: Secondary | ICD-10-CM

## 2015-01-07 ENCOUNTER — Ambulatory Visit
Admission: RE | Admit: 2015-01-07 | Discharge: 2015-01-07 | Disposition: A | Discharge status: Home / Self Care | Payer: PPO | Source: Ambulatory Visit | Attending: Urology | Admitting: Urology

## 2015-01-07 DIAGNOSIS — I861 Scrotal varices: Secondary | ICD-10-CM | POA: Diagnosis present

## 2015-03-01 ENCOUNTER — Ambulatory Visit: Payer: PPO

## 2015-03-01 ENCOUNTER — Other Ambulatory Visit: Payer: Self-pay | Admitting: Family Medicine

## 2015-03-11 ENCOUNTER — Ambulatory Visit (INDEPENDENT_AMBULATORY_CARE_PROVIDER_SITE_OTHER): Payer: PPO | Admitting: Urology

## 2015-03-11 ENCOUNTER — Ambulatory Visit: Payer: PPO

## 2015-03-11 ENCOUNTER — Encounter: Payer: Self-pay | Admitting: Urology

## 2015-03-11 VITALS — BP 125/81 | HR 98 | Ht 68.0 in | Wt 194.8 lb

## 2015-03-11 DIAGNOSIS — N400 Enlarged prostate without lower urinary tract symptoms: Secondary | ICD-10-CM

## 2015-03-11 DIAGNOSIS — I861 Scrotal varices: Secondary | ICD-10-CM

## 2015-03-11 DIAGNOSIS — N528 Other male erectile dysfunction: Secondary | ICD-10-CM | POA: Diagnosis not present

## 2015-03-11 LAB — PR COMPLEX UROFLOWMETRY: SCAN RESULT: 209

## 2015-03-11 LAB — BLADDER SCAN AMB NON-IMAGING: SCAN RESULT: 0

## 2015-03-11 MED ORDER — TADALAFIL 5 MG PO TABS: 5.0000 mg | ORAL_TABLET | Freq: Every day | ORAL | Status: DC | PRN

## 2015-03-11 MED ORDER — SILODOSIN 8 MG PO CAPS: 8.0000 mg | ORAL_CAPSULE | Freq: Every day | ORAL | Status: DC

## 2015-03-11 NOTE — Progress Notes (Signed)
Bladder Scan Patient void: 253mL prior to scan. 20mL post scan Performed By: Larna Daughters

## 2015-03-11 NOTE — Progress Notes (Signed)
03/11/2015 2:53 PM   Benjamin Gutierrez Oct 06, 1950 [id number]  Referring provider: Margo Common, [place], [place] [address]  Chief Complaint  Patient presents with  . Follow-up    BPH    HPI: The patient is a 65 year old gentleman presenting for evaluation of right epididymitis. He has been on doxycycline for 3 weeks. He is noted minimal improvement. He is still taking Aleve daily. Denies any fevers, chills, nausea, or vomiting. His urinalysis today is negative. In terms of other urological issues, he notes nocturia 2, frequency, and occasional dysuria. He also has erectile dysfunction. For his BPH and ED, he has been on Cialis 5 mg. He stopped taking this recently when he developed the pain in his right testicle. He is worried that he may not be able afford the medication in the future.  October 2016 Interval History: The patient follows up for his right epididymitis. He says the pain has resolved. U/S shows right varicocele otherwise normal. He has no urinary complaints. He has been able to obtain cialis. It is working well. Nocturia x1. He is happy with his urinary quality of life. He is able to get erections capable penetration is happy with this.    January 2017 interval history:  The patient reports doing well since his last appointment. He did have a renal ultrasound for a right-sided varicocele which was unremarkable.  He reports there is happy with his urinary quality of life this time. His I PSS score today is 14/2. This indicates mostly satisfied. However he does have score of 3 in intermittency and score of 4 weeks stream. His uroflow today is consistent with this showing a peak flow 6 ML's per second and intermittent stream. He was however able to empty his bladder with a PVR of 0. He is happy with Cialis to treat his erectile dysfunction and BPH, however he finds it too expensive this time. It is costing him approximately $300 per month.  He denies  any other changes to his urinary gastrointestinal system.   PMH: Past Medical History  Diagnosis Date  . GERD (gastroesophageal reflux disease)   . Arthritis   . Leg varices 08/06/2014  . Acid reflux 08/06/2014  . 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 ED (erectile dysfunction) of organic origin 08/06/2014  . Anxiety 08/06/2014  . Blood in feces 08/06/2014  . Dizziness and giddiness 08/06/2014  . H/O arthrodesis 08/06/2014  . Decreased libido 08/06/2014    Surgical History: Past Surgical History  Procedure Laterality Date  . Laminectomy  2005  . Cardiac catheterization  1993  . Cervical spine surgery  2009    radiculopathy from past fusion and arthritic changes  . Colonoscopy  2013    Home Medications:    Medication List       This list is accurate as of: 03/11/15  2:53 PM.  Always use your most recent med list.               FLUZONE HIGH-DOSE 0.5 ML Susy  Generic drug:  Influenza Vac Split High-Dose  Reported on 03/11/2015     ibuprofen 200 MG tablet  Commonly known as:  ADVIL,MOTRIN  Take 3 tablets by mouth every 4 (four) hours as needed.     naproxen 500 MG EC tablet  Commonly known as:  EC NAPROSYN  Take 1 tablet by mouth daily.     nystatin-triamcinolone cream  Commonly known as:  MYCOLOG II  Apply 1 application topically 2 (two) times  daily. As needed.     pantoprazole 40 MG tablet  Commonly known as:  PROTONIX  TAKE 1 TABLET (40 MG TOTAL) BY MOUTH DAILY.     silodosin 8 MG Caps capsule  Commonly known as:  RAPAFLO  Take 1 capsule (8 mg total) by mouth daily with breakfast.     tadalafil 5 MG tablet  Commonly known as:  CIALIS  Take 1 tablet (5 mg total) by mouth daily.     tadalafil 5 MG tablet  Commonly known as:  CIALIS  Take 1 tablet (5 mg total) by mouth daily as needed for erectile dysfunction.     Vitamin D3 1000 units Caps  Take 1 capsule by mouth daily.        Allergies:  Allergies  Allergen Reactions  . Iodine Itching  . Penicillins     . Povidone Iodine Itching    Family History: Family History  Problem Relation Age of Onset  . Bone cancer Mother   . Alcohol abuse Father   . Cancer Father     laryngeal cancer  . Bone cancer Brother   . Prostate cancer Brother   . Parkinson's disease Maternal Uncle   . Dementia Paternal Grandmother     Social History:  reports that he has never smoked. He has never used smokeless tobacco. He reports that he drinks alcohol. He reports that he does not use illicit drugs.  ROS: UROLOGY Frequent Urination?: Yes Hard to postpone urination?: No Burning/pain with urination?: No Get up at night to urinate?: Yes Leakage of urine?: No Urine stream starts and stops?: No Trouble starting stream?: No Do you have to strain to urinate?: No Blood in urine?: No Urinary tract infection?: No Sexually transmitted disease?: No Injury to kidneys or bladder?: No Painful intercourse?: No Weak stream?: No Erection problems?: Yes Penile pain?: No  Gastrointestinal Nausea?: No Vomiting?: No Indigestion/heartburn?: No Diarrhea?: No Constipation?: No  Constitutional Fever: No Night sweats?: No Weight loss?: No Fatigue?: No  Skin Skin rash/lesions?: No Itching?: No  Eyes Blurred vision?: No Double vision?: No  Ears/Nose/Throat Sore throat?: No Sinus problems?: No  Hematologic/Lymphatic Swollen glands?: No Easy bruising?: No  Cardiovascular Leg swelling?: No Chest pain?: No  Respiratory Cough?: No Shortness of breath?: No  Endocrine Excessive thirst?: No  Musculoskeletal Back pain?: No Joint pain?: No  Neurological Headaches?: No Dizziness?: No  Psychologic Depression?: No Anxiety?: No  Physical Exam: BP 125/81 mmHg  Pulse 98  Ht 5\' 8"  (1.727 m)  Wt 194 lb 12.8 oz (88.361 kg)  BMI 29.63 kg/m2  Constitutional:  Alert and oriented, No acute distress. HEENT: [place] AT, moist mucus membranes.  Trachea midline, no masses. Cardiovascular: No clubbing,  cyanosis, or edema. Respiratory: Normal respiratory effort, no increased work of breathing. GI: Abdomen is soft, nontender, nondistended, no abdominal masses GU: No CVA tenderness.  Normal phallus. Testicles and equal bilaterally. Right-sided varicocele again noted. Testicles nontender to palpation. Skin: No rashes, bruises or suspicious lesions. Lymph: No cervical or inguinal adenopathy. Neurologic: Grossly intact, no focal deficits, moving all 4 extremities. Psychiatric: Normal mood and affect.  Laboratory Data: Lab Results  Component Value Date   WBC 14.7* 05/13/2007   HGB 14.6 05/13/2007   HCT 42.0 05/13/2007   MCV 88.7 05/13/2007   PLT 180 05/13/2007    Lab Results  Component Value Date   CREATININE 0.94 05/13/2007    No results found for: PSA  No results found for: TESTOSTERONE  No results found for:  HGBA1C  Urinalysis    Component Value Date/Time   GLUCOSEU Negative 11/05/2014 1031   BILIRUBINUR Negative 11/05/2014 1031   BILIRUBINUR neg 09/24/2014 1123   PROTEINUR neg 09/24/2014 1123   UROBILINOGEN 0.2 09/24/2014 1123   NITRITE Negative 11/05/2014 1031   NITRITE neg 09/24/2014 1123   LEUKOCYTESUR Negative 11/05/2014 1031   LEUKOCYTESUR Negative 09/24/2014 1123    Pertinent Imaging: CLINICAL DATA: Right-sided varicocele. There is concern for renal malignancy.  EXAM: RENAL / URINARY TRACT ULTRASOUND COMPLETE  COMPARISON: Scrotal ultrasound, 11/12/2014.  FINDINGS: Right Kidney:  Length: 11.1 cm. Echogenicity within normal limits. No mass or hydronephrosis visualized.  Left Kidney:  Length: 12.2 cm. Echogenicity within normal limits. No mass or hydronephrosis visualized.  Bladder:  Appears normal for degree of bladder distention.  IMPRESSION: Normal renal ultrasound.   Uroflow :  peak flow 6 mL's per second  intermittent stream noted on graft  PVR 0  Assessment & Plan:    The patient has a slow and intermittent stream on  uroflow which matches his intermittency and weak stream complaints, however he is happy with his urinary symptoms at this time.  He is on Cialis for both ED and BPH but he cannot afford it. We will switch him to Rapaflo 8 mg daily for BPH. Samples were given. He was warned of the risk of orthostatic hypotension. He will continue his Cialis now only on a prn basis for erectile dysfunction.  1. Benign prostatic hypertrophy (BPH) with weak urinary stream - rapaflo 8 mg daily - follow up in  3 months for high PSS, uroflow, PVR  2. ED (erectile dysfunction) of organic origin - tadalafil (CIALIS) 5 MG tablet. Patient was instructed to switch this to prn dosing for erectile dysfunction due to the significant cost  3. Right varicocele -Renal ultrasound negative. No further work up required.     Return in about 3 months (around 06/09/2015) for with PVR, IPSS, uroflow.  Nickie Retort, MD  [facility] [address] [place], [place] [address] [phone]

## 2015-05-02 ENCOUNTER — Encounter: Payer: Self-pay | Admitting: Family Medicine

## 2015-05-02 ENCOUNTER — Ambulatory Visit (INDEPENDENT_AMBULATORY_CARE_PROVIDER_SITE_OTHER): Payer: PPO | Admitting: Family Medicine

## 2015-05-02 VITALS — BP 136/88 | HR 72 | Temp 98.7°F | Resp 14 | Wt 190.0 lb

## 2015-05-02 DIAGNOSIS — M545 Low back pain, unspecified: Secondary | ICD-10-CM

## 2015-05-02 DIAGNOSIS — R519 Headache, unspecified: Secondary | ICD-10-CM

## 2015-05-02 DIAGNOSIS — R51 Headache: Secondary | ICD-10-CM | POA: Diagnosis not present

## 2015-05-02 MED ORDER — NAPROXEN 500 MG PO TBEC: 500.0000 mg | DELAYED_RELEASE_TABLET | Freq: Two times a day (BID) | ORAL | Status: DC

## 2015-05-02 NOTE — Patient Instructions (Signed)

## 2015-05-02 NOTE — Progress Notes (Signed)
Patient ID: Benjamin Gutierrez, male   DOB: 04/23/1950, 65 y.o.   MRN: [id number]   Patient: Benjamin Gutierrez Male    DOB: [birth year]   65 y.o.   MRN: [id number] Visit Date: 05/02/2015  Today's Provider: Vernie Murders, PA   Chief Complaint  Patient presents with  . Back Pain  . Fatigue  . Headache   Subjective:    HPI Patient presents with lower back pain, headache, and fatigue. Patient reports he was helping a friend clean his house when the back pain and headache occurred. Patient reports taking OTC medication with no relief. Patient states back pain is a throbbing pain that is gradually worsening since onset Saturday. Patient is concerned he might have indigested something while helping clean Friday 04-29-15.   Past Medical History  Diagnosis Date  . GERD (gastroesophageal reflux disease)   . Arthritis   . Leg varices 08/06/2014  . Acid reflux 08/06/2014  . 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 ED (erectile dysfunction) of organic origin 08/06/2014  . Anxiety 08/06/2014  . Blood in feces 08/06/2014  . Dizziness and giddiness 08/06/2014  . H/O arthrodesis 08/06/2014  . Decreased libido 08/06/2014   Past Surgical History  Procedure Laterality Date  . Laminectomy  2005  . Cardiac catheterization  1993  . Cervical spine surgery  2009    radiculopathy from past fusion and arthritic changes  . Colonoscopy  2013   Family History  Problem Relation Age of Onset  . Bone cancer Mother   . Alcohol abuse Father   . Cancer Father     laryngeal cancer  . Bone cancer Brother   . Prostate cancer Brother   . Parkinson's disease Maternal Uncle   . Dementia Paternal Grandmother    Previous Medications   CHOLECALCIFEROL (VITAMIN D3) 1000 UNITS CAPS    Take 1 capsule by mouth daily.   IBUPROFEN (ADVIL,MOTRIN) 200 MG TABLET    Take 3 tablets by mouth every 4 (four) hours as needed.   NAPROXEN (EC NAPROSYN) 500 MG EC TABLET    Take 1 tablet by mouth daily.   NYSTATIN-TRIAMCINOLONE (MYCOLOG II) CREAM     Apply 1 application topically 2 (two) times daily. As needed.   PANTOPRAZOLE (PROTONIX) 40 MG TABLET    TAKE 1 TABLET (40 MG TOTAL) BY MOUTH DAILY.   SILODOSIN (RAPAFLO) 8 MG CAPS CAPSULE    Take 1 capsule (8 mg total) by mouth daily with breakfast.   TADALAFIL (CIALIS) 5 MG TABLET    Take 1 tablet (5 mg total) by mouth daily as needed for erectile dysfunction.   Allergies  Allergen Reactions  . Iodine Itching  . Penicillins   . Povidone Iodine Itching    Review of Systems  Constitutional: Positive for fatigue.  Eyes: Negative.   Respiratory: Negative.   Cardiovascular: Negative.   Gastrointestinal: Negative.   Endocrine: Negative.   Genitourinary: Negative.   Musculoskeletal: Positive for back pain.  Skin: Negative.   Allergic/Immunologic: Negative.   Neurological: Positive for headaches.  Hematological: Negative.   Psychiatric/Behavioral: Negative.     Social History  Substance Use Topics  . Smoking status: Never Smoker   . Smokeless tobacco: Never Used  . Alcohol Use: 0.0 oz/week    0 Standard drinks or equivalent per week     Comment: OCCASIONALLY   Objective:   BP 136/88 mmHg  Pulse 72  Temp(Src) 98.7 F (37.1 C) (Oral)  Resp 14  Wt 190 lb (86.183 kg)  Physical Exam  Constitutional: He is oriented to person, place, and time. He appears well-developed and well-nourished.  HENT:  Head: Normocephalic.  Right Ear: External ear normal.  Left Ear: External ear normal.  Nose: Nose normal.  Mouth/Throat: Oropharynx is clear and moist.  Eyes: Conjunctivae and EOM are normal.  Neck: Normal range of motion. Neck supple.  Cardiovascular: Normal rate, regular rhythm and normal heart sounds.   Pulmonary/Chest: Effort normal and breath sounds normal.  Abdominal: Soft. Bowel sounds are normal.  Musculoskeletal: He exhibits no tenderness.  Stiff right index finger from past injury. Wasted interosseus muscles of the right hand. SLR's 90 degrees without weakness or pain   Heberdens 's nodules on distal joints of each finger.   Neurological: He is alert and oriented to person, place, and time.      Assessment & Plan:     1. Midline low back pain without sciatica Onset over the past weekend. Suspect secondary to arthritis and extra physical activities (cleaning a friend's house, installing french doors, etc.). With nodules on fingers of each hand and back stiffness, will give refill of EC-Naprosyn and may apply moist heat. Will check labs for signs of RA. - naproxen (EC NAPROSYN) 500 MG EC tablet; Take 1 tablet (500 mg total) by mouth 2 (two) times daily with a meal.  Dispense: 60 tablet; Refill: 3 - CBC with Differential/Platelet - Sedimentation rate - Rheumatoid Factor  2. Headache, unspecified headache type Intermittent. May use Naprosyn for discomfort. Will check labs for signs of infection/virus. Mild headache without dizziness, aura, nausea or nasal congestion.

## 2015-05-03 ENCOUNTER — Telehealth: Payer: Self-pay

## 2015-05-03 LAB — CBC WITH DIFFERENTIAL/PLATELET
BASOS: 0 %
Basophils Absolute: 0 10*3/uL (ref 0.0–0.2)
EOS (ABSOLUTE): 0 10*3/uL (ref 0.0–0.4)
EOS: 0 %
HEMATOCRIT: 47.9 % (ref 37.5–51.0)
Hemoglobin: 16.6 g/dL (ref 12.6–17.7)
IMMATURE GRANS (ABS): 0 10*3/uL (ref 0.0–0.1)
Immature Granulocytes: 0 %
Lymphocytes Absolute: 1.5 10*3/uL (ref 0.7–3.1)
Lymphs: 23 %
MCH: 30.5 pg (ref 26.6–33.0)
MCHC: 34.7 g/dL (ref 31.5–35.7)
MCV: 88 fL (ref 79–97)
MONOS ABS: 0.6 10*3/uL (ref 0.1–0.9)
Monocytes: 9 %
NEUTROS ABS: 4.4 10*3/uL (ref 1.4–7.0)
NEUTROS PCT: 68 %
PLATELETS: 203 10*3/uL (ref 150–379)
RBC: 5.45 x10E6/uL (ref 4.14–5.80)
RDW: 13.4 % (ref 12.3–15.4)
WBC: 6.5 10*3/uL (ref 3.4–10.8)

## 2015-05-03 LAB — SEDIMENTATION RATE: Sed Rate: 2 mm/hr (ref 0–30)

## 2015-05-03 LAB — RHEUMATOID FACTOR: Rhuematoid fact SerPl-aCnc: <10 IU/mL (ref 0.0–13.9)

## 2015-05-03 NOTE — Telephone Encounter (Signed)
-----   Message from Margo Common, [place] sent at 05/03/2015  8:38 AM EDT ----- All blood tests normal. No sign of rheumatoid arthritis or infection. Use treatment as planned. If no better in 10-14 days, will need recheck appointment, possible x-ray and stronger medications.

## 2015-05-03 NOTE — Telephone Encounter (Signed)
Patient advised as directed below. Patient verbalized understanding and agrees with plan of care.  

## 2015-05-03 NOTE — Telephone Encounter (Signed)
LMTCB

## 2015-05-09 ENCOUNTER — Ambulatory Visit
Admission: RE | Admit: 2015-05-09 | Discharge: 2015-05-09 | Disposition: A | Discharge status: Home / Self Care | Payer: PPO | Source: Ambulatory Visit | Attending: Family Medicine | Admitting: Family Medicine

## 2015-05-09 ENCOUNTER — Telehealth: Payer: Self-pay | Admitting: Family Medicine

## 2015-05-09 DIAGNOSIS — I700 Atherosclerosis of aorta: Secondary | ICD-10-CM | POA: Insufficient documentation

## 2015-05-09 DIAGNOSIS — M5136 Other intervertebral disc degeneration, lumbar region: Secondary | ICD-10-CM | POA: Diagnosis not present

## 2015-05-09 DIAGNOSIS — M545 Low back pain, unspecified: Secondary | ICD-10-CM

## 2015-05-09 DIAGNOSIS — M5137 Other intervertebral disc degeneration, lumbosacral region: Secondary | ICD-10-CM | POA: Insufficient documentation

## 2015-05-09 NOTE — Telephone Encounter (Signed)
Patient advised as directed below. Patient verbalized understanding.  

## 2015-05-09 NOTE — Telephone Encounter (Signed)
Will schedule for L-S spine x-ray (placed order to get done today) and should schedule follow up appointment pending report.

## 2015-05-09 NOTE — Telephone Encounter (Signed)
Please advise 

## 2015-05-09 NOTE — Telephone Encounter (Signed)
Pt states he seen Simona Huh last week and is not any better.  Pt is asking if he can have an x-ray done.  CB#[phone]/MW

## 2015-06-09 ENCOUNTER — Encounter: Payer: Self-pay | Admitting: Urology

## 2015-06-09 ENCOUNTER — Ambulatory Visit (INDEPENDENT_AMBULATORY_CARE_PROVIDER_SITE_OTHER): Payer: PPO | Admitting: Urology

## 2015-06-09 VITALS — BP 122/79 | HR 77 | Ht 68.0 in | Wt 191.9 lb

## 2015-06-09 DIAGNOSIS — Z125 Encounter for screening for malignant neoplasm of prostate: Secondary | ICD-10-CM | POA: Diagnosis not present

## 2015-06-09 DIAGNOSIS — N5201 Erectile dysfunction due to arterial insufficiency: Secondary | ICD-10-CM | POA: Diagnosis not present

## 2015-06-09 DIAGNOSIS — N400 Enlarged prostate without lower urinary tract symptoms: Secondary | ICD-10-CM

## 2015-06-09 LAB — BLADDER SCAN AMB NON-IMAGING: Scan Result: 76

## 2015-06-09 MED ORDER — SILODOSIN 8 MG PO CAPS: 8.0000 mg | ORAL_CAPSULE | Freq: Every day | ORAL | Status: DC

## 2015-06-09 MED ORDER — SILDENAFIL CITRATE 20 MG PO TABS: 20.0000 mg | ORAL_TABLET | Freq: Every day | ORAL | Status: DC | PRN

## 2015-06-09 NOTE — Progress Notes (Signed)
06/09/2015 11:34 AM   Benjamin Gutierrez [birth year] [id number]  Referring provider: Margo Common, [place] [address] [place], [place] [address]  Chief Complaint  Patient presents with  . Follow-up    BPH    HPI: The patient is a 65 year old gentleman presenting for evaluation of right epididymitis. He has been on doxycycline for 3 weeks. He is noted minimal improvement. He is still taking Aleve daily. Denies any fevers, chills, nausea, or vomiting. His urinalysis today is negative. In terms of other urological issues, he notes nocturia 2, frequency, and occasional dysuria. He also has erectile dysfunction. For his BPH and ED, he has been on Cialis 5 mg. He stopped taking this recently when he developed the pain in his right testicle. He is worried that he may not be able afford the medication in the future.  October 2016 Interval History: The patient follows up for his right epididymitis. He says the pain has resolved. U/S shows right varicocele otherwise normal. He has no urinary complaints. He has been able to obtain cialis. It is working well. Nocturia x1. He is happy with his urinary quality of life. He is able to get erections capable penetration is happy with this.   January 2017 interval history: The patient reports doing well since his last appointment. He did have a renal ultrasound for a right-sided varicocele which was unremarkable. He reports there is happy with his urinary quality of life this time. His I PSS score today is 14/2. This indicates mostly satisfied. However he does have score of 3 in intermittency and score of 4 weeks stream. His uroflow today is consistent with this showing a peak flow 6 ML's per second and intermittent stream. He was however able to empty his bladder with a PVR of 0. He is happy with Cialis to treat his erectile dysfunction and BPH, however he finds it too expensive this time. It is costing him approximately $300 per month. He denies  any other changes to his urinary gastrointestinal system.    April 2017 interval history: The patient was started on Rapaflo and his last visit. His I PSS score today is 4/1. He has nocturia 1. DRE is 1+ smooth in December 2016. He is very happy with the improvement of his urinary symptoms. He does note that Cialis works well for him but it is very expensive. He is interested in trying alternatives.  PMH: Past Medical History  Diagnosis Date  . GERD (gastroesophageal reflux disease)   . Arthritis   . Leg varices 08/06/2014  . Acid reflux 08/06/2014  . 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 ED (erectile dysfunction) of organic origin 08/06/2014  . Anxiety 08/06/2014  . Blood in feces 08/06/2014  . Dizziness and giddiness 08/06/2014  . H/O arthrodesis 08/06/2014  . Decreased libido 08/06/2014    Surgical History: Past Surgical History  Procedure Laterality Date  . Laminectomy  2005  . Cardiac catheterization  1993  . Cervical spine surgery  2009    radiculopathy from past fusion and arthritic changes  . Colonoscopy  2013    Home Medications:    Medication List       This list is accurate as of: 06/09/15 11:34 AM.  Always use your most recent med list.               ibuprofen 200 MG tablet  Commonly known as:  ADVIL,MOTRIN  Take 3 tablets by mouth every 4 (four) hours as needed.     naproxen  500 MG EC tablet  Commonly known as:  EC NAPROSYN  Take 1 tablet (500 mg total) by mouth 2 (two) times daily with a meal.     nystatin-triamcinolone cream  Commonly known as:  MYCOLOG II  Apply 1 application topically 2 (two) times daily. As needed.     pantoprazole 40 MG tablet  Commonly known as:  PROTONIX  TAKE 1 TABLET (40 MG TOTAL) BY MOUTH DAILY.     sildenafil 20 MG tablet  Commonly known as:  REVATIO  Take 1 tablet (20 mg total) by mouth daily as needed (take 1 - 5 tablets daily as needed).     silodosin 8 MG Caps capsule  Commonly known as:  RAPAFLO  Take 1 capsule (8 mg  total) by mouth daily with breakfast.     silodosin 8 MG Caps capsule  Commonly known as:  RAPAFLO  Take 1 capsule (8 mg total) by mouth daily with breakfast.     Vitamin D3 1000 units Caps  Take 1 capsule by mouth daily.        Allergies:  Allergies  Allergen Reactions  . Iodine Itching  . Penicillins   . Povidone Iodine Itching    Family History: Family History  Problem Relation Age of Onset  . Bone cancer Mother   . Alcohol abuse Father   . Cancer Father     laryngeal cancer  . Bone cancer Brother   . Prostate cancer Brother   . Parkinson's disease Maternal Uncle   . Dementia Paternal Grandmother     Social History:  reports that he has never smoked. He has never used smokeless tobacco. He reports that he drinks alcohol. He reports that he does not use illicit drugs.  ROS: UROLOGY Frequent Urination?: No Hard to postpone urination?: No Burning/pain with urination?: No Get up at night to urinate?: No Leakage of urine?: No Urine stream starts and stops?: No Trouble starting stream?: No Do you have to strain to urinate?: No Blood in urine?: No Urinary tract infection?: No Sexually transmitted disease?: No Injury to kidneys or bladder?: No Painful intercourse?: No Weak stream?: No Erection problems?: Yes Penile pain?: No  Gastrointestinal Nausea?: No Vomiting?: No Indigestion/heartburn?: No Diarrhea?: No Constipation?: No  Constitutional Fever: No Night sweats?: No Weight loss?: No Fatigue?: No  Skin Skin rash/lesions?: No Itching?: No  Eyes Blurred vision?: No Double vision?: No  Ears/Nose/Throat Sore throat?: No Sinus problems?: No  Hematologic/Lymphatic Swollen glands?: No Easy bruising?: No  Cardiovascular Leg swelling?: No Chest pain?: No  Respiratory Cough?: No Shortness of breath?: No  Endocrine Excessive thirst?: No  Musculoskeletal Back pain?: No Joint pain?: No  Neurological Headaches?: No Dizziness?:  No  Psychologic Depression?: No Anxiety?: No  Physical Exam: BP 122/79 mmHg  Pulse 77  Ht 5\' 8"  (1.727 m)  Wt 191 lb 14.4 oz (87.045 kg)  BMI 29.18 kg/m2  Constitutional:  Alert and oriented, No acute distress. HEENT: [place] AT, moist mucus membranes.  Trachea midline, no masses. Cardiovascular: No clubbing, cyanosis, or edema. Respiratory: Normal respiratory effort, no increased work of breathing. GI: Abdomen is soft, nontender, nondistended, no abdominal masses GU: No CVA tenderness.  Skin: No rashes, bruises or suspicious lesions. Lymph: No cervical or inguinal adenopathy. Neurologic: Grossly intact, no focal deficits, moving all 4 extremities. Psychiatric: Normal mood and affect.  Laboratory Data: Lab Results  Component Value Date   WBC 6.5 05/02/2015   HGB 14.6 05/13/2007   HCT 47.9 05/02/2015  MCV 88 05/02/2015   PLT 203 05/02/2015    Lab Results  Component Value Date   CREATININE 0.94 05/13/2007    No results found for: PSA  No results found for: TESTOSTERONE  No results found for: HGBA1C  Urinalysis    Component Value Date/Time   APPEARANCEUR Clear 11/05/2014 1031   GLUCOSEU Negative 11/05/2014 1031   BILIRUBINUR Negative 11/05/2014 1031   BILIRUBINUR neg 09/24/2014 1123   PROTEINUR Negative 11/05/2014 1031   PROTEINUR neg 09/24/2014 1123   UROBILINOGEN 0.2 09/24/2014 1123   NITRITE Negative 11/05/2014 1031   NITRITE neg 09/24/2014 1123   LEUKOCYTESUR Negative 11/05/2014 1031   LEUKOCYTESUR Negative 09/24/2014 1123    Assessment & Plan:    1. Benign prostatic hypertrophy (BPH) with weak urinary stream - rapaflo 8 mg daily - follow up in 6 months  2. ED (erectile dysfunction) of organic origin - We'll switch the patient to generic sildenafil 20 mg. He will take 1-5 tablets daily as needed. He was 100 take this medication along with his previously prescribed Cialis.  3. Right varicocele -Renal ultrasound negative. No further work up required.    4. Prostate cancer screening Due for PSA today. Due for DRE in 6 months.  Return in about 6 months (around 12/09/2015).  Nickie Retort, MD  [facility] [address], [place], [place] [address] [phone]

## 2015-06-10 ENCOUNTER — Telehealth: Payer: Self-pay

## 2015-06-10 LAB — PSA TOTAL (REFLEX TO FREE): Prostate Specific Ag, Serum: 0.9 ng/mL (ref 0.0–4.0)

## 2015-06-10 NOTE — Telephone Encounter (Signed)
Spoke with pt in reference to PSA results. Pt voiced understanding.  

## 2015-06-10 NOTE — Telephone Encounter (Signed)
-----   Message from Nickie Retort, MD sent at 06/10/2015  1:31 PM EDT ----- Please let patient know his PSA is normal. He should folow up as previously scheduled

## 2015-08-09 ENCOUNTER — Telehealth: Payer: Self-pay | Admitting: Urology

## 2015-08-09 DIAGNOSIS — N400 Enlarged prostate without lower urinary tract symptoms: Secondary | ICD-10-CM

## 2015-08-09 NOTE — Telephone Encounter (Signed)
Patient said that the coupon dr. Pilar Jarvis gave him for rapaflo he can not use and so he wants to speak with somebody about getting something else.   Sharyn Lull

## 2015-08-10 NOTE — Telephone Encounter (Signed)
Please advise 

## 2015-08-10 NOTE — Telephone Encounter (Signed)
We can try tamsulosin 0.4 mg daily.

## 2015-08-11 MED ORDER — TAMSULOSIN HCL 0.4 MG PO CAPS: 0.4000 mg | ORAL_CAPSULE | Freq: Every day | ORAL | Status: DC

## 2015-08-11 NOTE — Telephone Encounter (Signed)
Spoke with pt in reference to medications. Pt stated that he can not afford rapaflo. A trial of flomax was given. Pt voiced understanding.

## 2015-08-11 NOTE — Telephone Encounter (Signed)
Wife will have pt call back.  

## 2015-08-15 ENCOUNTER — Encounter: Payer: Self-pay | Admitting: Family Medicine

## 2015-08-15 ENCOUNTER — Ambulatory Visit (INDEPENDENT_AMBULATORY_CARE_PROVIDER_SITE_OTHER): Payer: PPO | Admitting: Family Medicine

## 2015-08-15 VITALS — BP 128/80 | HR 76 | Temp 98.5°F | Resp 16 | Ht 68.0 in | Wt 194.0 lb

## 2015-08-15 DIAGNOSIS — Z0000 Encounter for general adult medical examination without abnormal findings: Secondary | ICD-10-CM

## 2015-08-15 NOTE — Progress Notes (Signed)
Patient: Benjamin Gutierrez, Male    DOB: 22-Aug-1950, 65 y.o.   MRN: [id number] Visit Date: 08/15/2015  Today's Provider: Vernie Murders, PA   Chief Complaint  Patient presents with  . Annual Wellness Exam   Subjective:    Annual wellness visit Benjamin Gutierrez is a 65 y.o. male. He feels well. He reports exercising occasionally. He reports he is sleeping well.   Review of Systems  Constitutional: Negative.   HENT: Negative.   Eyes: Negative.   Respiratory: Negative.   Cardiovascular: Negative.   Gastrointestinal: Negative.   Endocrine: Negative.   Genitourinary: Negative.   Musculoskeletal: Negative.   Skin: Negative.   Allergic/Immunologic: Negative.   Neurological: Negative.   Hematological: Negative.   Psychiatric/Behavioral: Negative.     Social History   Social History  . Marital Status: Married    Spouse Name: N/A  . Number of Children: N/A  . Years of Education: N/A   Occupational History  . Not on file.   Social History Main Topics  . Smoking status: Never Smoker   . Smokeless tobacco: Never Used  . Alcohol Use: 0.0 oz/week    0 Standard drinks or equivalent per week     Comment: OCCASIONALLY  . Drug Use: No  . Sexual Activity: Not on file   Other Topics Concern  . Not on file   Social History Narrative    Past Medical History  Diagnosis Date  . GERD (gastroesophageal reflux disease)   . Arthritis   . Leg varices 08/06/2014  . Acid reflux 08/06/2014  . 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 ED (erectile dysfunction) of organic origin 08/06/2014  . Anxiety 08/06/2014  . Blood in feces 08/06/2014  . Dizziness and giddiness 08/06/2014  . H/O arthrodesis 08/06/2014  . Decreased libido 08/06/2014     Patient Active Problem List   Diagnosis Date Noted  . Osteoarthrosis, hand 08/06/2014  . Anxiety 08/06/2014  . Blood in feces 08/06/2014  . Acid reflux 08/06/2014  . ED (erectile dysfunction) of organic origin 08/06/2014  . Dizziness and  giddiness 08/06/2014  . H/O arthrodesis 08/06/2014  . Decreased libido 08/06/2014  . Avitaminosis D 08/06/2014  . Leg varices 08/06/2014  . 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 LBP (low back pain) 04/05/2006    Past Surgical History  Procedure Laterality Date  . Laminectomy  2005  . Cardiac catheterization  1993  . Cervical spine surgery  2009    radiculopathy from past fusion and arthritic changes  . Colonoscopy  2013    His family history includes Alcohol abuse in his father; Bone cancer in his brother and mother; Cancer in his father; Dementia in his paternal grandmother; Parkinson's disease in his maternal uncle; Prostate cancer in his brother.    Current Meds  Medication Sig  . Cholecalciferol (VITAMIN D3) 1000 UNITS CAPS Take 1 capsule by mouth daily.  Marland Kitchen ibuprofen (ADVIL,MOTRIN) 200 MG tablet Take 3 tablets by mouth every 4 (four) hours as needed.  . naproxen (EC NAPROSYN) 500 MG EC tablet Take 1 tablet (500 mg total) by mouth 2 (two) times daily with a meal.  . nystatin-triamcinolone (MYCOLOG II) cream Apply 1 application topically 2 (two) times daily. As needed.  . pantoprazole (PROTONIX) 40 MG tablet TAKE 1 TABLET (40 MG TOTAL) BY MOUTH DAILY.  . sildenafil (REVATIO) 20 MG tablet Take 1 tablet (20 mg total) by mouth daily as needed (take 1 - 5 tablets daily as needed).  Marland Kitchen  tamsulosin (FLOMAX) 0.4 MG CAPS capsule Take 1 capsule (0.4 mg total) by mouth daily.    Patient Care Team: Margo Common, PA as PCP - General (Physician Assistant)    Objective:   Vitals: BP 128/80 mmHg  Pulse 76  Temp(Src) 98.5 F (36.9 C)  Resp 16  Ht 5\' 8"  (1.727 m)  Wt 194 lb (87.998 kg)  BMI 29.50 kg/m2  SpO2 95% Wt Readings from Last 3 Encounters:  08/15/15 194 lb (87.998 kg)  06/09/15 191 lb 14.4 oz (87.045 kg)  05/02/15 190 lb (86.183 kg)    Physical Exam  Constitutional: He is oriented to person, place, and time. He appears well-developed and well-nourished.  HENT:  Head:  Normocephalic and atraumatic.  Right Ear: External ear normal.  Left Ear: External ear normal.  Nose: Nose normal.  Mouth/Throat: Oropharynx is clear and moist.  Eyes: Conjunctivae and EOM are normal. Pupils are equal, round, and reactive to light. Right eye exhibits no discharge.  Neck: Normal range of motion. No tracheal deviation present. No thyromegaly present.  Stiffness of neck from past surgery and fusion of cervical vertebra.  Cardiovascular: Normal rate, regular rhythm, normal heart sounds and intact distal pulses.   No murmur heard. Pulmonary/Chest: Effort normal and breath sounds normal. No respiratory distress. He has no wheezes. He has no rales. He exhibits no tenderness.  Abdominal: Soft. He exhibits no distension and no mass. There is no tenderness. There is no rebound and no guarding.  Genitourinary:  Exam deferred to urologist (Dr. Pilar Jarvis). PSA was 0.9 on 06-09-15.  Musculoskeletal: He exhibits no edema or tenderness.  Slight tremor when testing grip strength on the right. Wasting of interosseous muscles of the right hand from past cervical radiculopathy.  Lymphadenopathy:    He has no cervical adenopathy.  Neurological: He is alert and oriented to person, place, and time. He has normal reflexes. No cranial nerve deficit. He exhibits normal muscle tone. Coordination normal.  Skin: Skin is warm and dry. No rash noted. No erythema.  Psychiatric: He has a normal mood and affect. His behavior is normal. Judgment and thought content normal.    Activities of Daily Living In your present state of health, do you have any difficulty performing the following activities: 08/15/2015  Hearing? Y  Vision? N  Difficulty concentrating or making decisions? N  Walking or climbing stairs? N  Dressing or bathing? N  Doing errands, shopping? N    Fall Risk Assessment Fall Risk  08/15/2015  Falls in the past year? No     Depression Screen PHQ 2/9 Scores 08/15/2015  PHQ - 2 Score 0     Cognitive Testing - 6-CIT  Correct? Score   What year is it? yes 0 0 or 4  What month is it? yes 0 0 or 3  Memorize:    Pia Mau,  40,  [place],      What time is it? (within 1 hour) yes 0 0 or 3  Count backwards from 20 yes 0 0, 2, or 4  Name the months of the year no 2 0, 2, or 4  Repeat name & address above no 2 0, 2, 4, 6, 8, or 10       TOTAL SCORE  4/28   Interpretation:  Normal  Normal (0-7) Abnormal (8-28)    Assessment & Plan:     Annual Wellness Visit  Reviewed patient's Family Medical History Reviewed and updated list of patient's medical  providers Assessment of cognitive impairment was done Assessed patient's functional ability Established a written schedule for health screening [place] Completed and Reviewed  Exercise Activities and Dietary recommendations Goals    Continue efforts to follow low fat diet and working at home getting physical activities routinely.      Immunization History  Administered Date(s) Administered  . Pneumococcal Conjugate-13 05/28/2014  . Td 11/22/2004, 10/10/2010  . Tdap 10/10/2010  . Zoster 09/01/2010, 12/25/2012    Health Maintenance  Topic Date Due  . PNA vac Low Risk Adult (2 of 2 - PPSV23) 05/28/2015  . INFLUENZA VACCINE  09/20/2015  . TETANUS/TDAP  10/09/2020  . COLONOSCOPY  06/17/2024  . ZOSTAVAX  Completed  . Hepatitis C Screening  Completed      Discussed health benefits of physical activity, and encouraged him to engage in regular exercise appropriate for his age and condition.    ------------------------------------------------------------------------------------------------------------    Vernie Murders, [place]

## 2015-08-15 NOTE — Patient Instructions (Signed)

## 2015-09-19 DIAGNOSIS — H43813 Vitreous degeneration, bilateral: Secondary | ICD-10-CM | POA: Diagnosis not present

## 2015-10-22 DIAGNOSIS — R000 Tachycardia, unspecified: Secondary | ICD-10-CM | POA: Diagnosis not present

## 2015-10-22 DIAGNOSIS — R030 Elevated blood-pressure reading, without diagnosis of hypertension: Secondary | ICD-10-CM | POA: Diagnosis not present

## 2015-10-22 DIAGNOSIS — S5010XA Contusion of unspecified forearm, initial encounter: Secondary | ICD-10-CM | POA: Diagnosis not present

## 2015-10-26 DIAGNOSIS — H43811 Vitreous degeneration, right eye: Secondary | ICD-10-CM | POA: Diagnosis not present

## 2015-11-23 DIAGNOSIS — H43813 Vitreous degeneration, bilateral: Secondary | ICD-10-CM | POA: Diagnosis not present

## 2015-11-25 ENCOUNTER — Ambulatory Visit (INDEPENDENT_AMBULATORY_CARE_PROVIDER_SITE_OTHER): Payer: PPO | Admitting: Family Medicine

## 2015-11-25 ENCOUNTER — Encounter: Payer: Self-pay | Admitting: Family Medicine

## 2015-11-25 VITALS — BP 124/88 | HR 82 | Temp 98.1°F | Resp 16 | Wt 189.8 lb

## 2015-11-25 DIAGNOSIS — T50A95A Adverse effect of other bacterial vaccines, initial encounter: Secondary | ICD-10-CM

## 2015-11-25 NOTE — Progress Notes (Signed)
Patient: Benjamin Gutierrez Male    DOB: 1950/11/27   65 y.o.   MRN: [id number] Visit Date: 11/25/2015  Today's Provider: Vernie Murders, PA   Chief Complaint  Patient presents with  . Allergic Reaction   Subjective:    Allergic Reaction  This is a new problem. Episode onset: Tuesday night. The problem occurs constantly. The problem is unchanged. The problem is moderate. Associated with: pneumonvax and high dose influenza vaccines. The time of exposure was just prior to onset. Associated symptoms include a rash. (Redness and pain right elbow area )   Past Medical History:  Diagnosis Date  . Acid reflux 08/06/2014  . Anxiety 08/06/2014  . Arthritis   . Blood in feces 08/06/2014  . 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 Decreased libido 08/06/2014  . Dizziness and giddiness 08/06/2014  . ED (erectile dysfunction) of organic origin 08/06/2014  . GERD (gastroesophageal reflux disease)   . H/O arthrodesis 08/06/2014  . Leg varices 08/06/2014   Past Surgical History:  Procedure Laterality Date  . CARDIAC CATHETERIZATION  1993  . CERVICAL SPINE SURGERY  2009   radiculopathy from past fusion and arthritic changes  . COLONOSCOPY  2013  . LAMINECTOMY  2005   Family History  Problem Relation Age of Onset  . Bone cancer Mother   . Alcohol abuse Father   . Cancer Father     laryngeal cancer  . Bone cancer Brother   . Prostate cancer Brother   . Parkinson's disease Maternal Uncle   . Dementia Paternal Grandmother    Allergies  Allergen Reactions  . Iodine Itching  . Penicillins   . Povidone Iodine Itching     Previous Medications   CHOLECALCIFEROL (VITAMIN D3) 1000 UNITS CAPS    Take 1 capsule by mouth daily.   IBUPROFEN (ADVIL,MOTRIN) 200 MG TABLET    Take 3 tablets by mouth every 4 (four) hours as needed.   NAPROXEN (EC NAPROSYN) 500 MG EC TABLET    Take 1 tablet (500 mg total) by mouth 2 (two) times daily with a meal.   NYSTATIN-TRIAMCINOLONE (MYCOLOG II) CREAM    Apply 1 application  topically 2 (two) times daily. As needed.   PANTOPRAZOLE (PROTONIX) 40 MG TABLET    TAKE 1 TABLET (40 MG TOTAL) BY MOUTH DAILY.   SILDENAFIL (REVATIO) 20 MG TABLET    Take 1 tablet (20 mg total) by mouth daily as needed (take 1 - 5 tablets daily as needed).   SILODOSIN (RAPAFLO) 8 MG CAPS CAPSULE    Take 1 capsule (8 mg total) by mouth daily with breakfast.   TAMSULOSIN (FLOMAX) 0.4 MG CAPS CAPSULE    Take 1 capsule (0.4 mg total) by mouth daily.    Review of Systems  Constitutional: Negative.   Respiratory: Negative.   Cardiovascular: Negative.   Skin: Positive for rash.    Social History  Substance Use Topics  . Smoking status: Never Smoker  . Smokeless tobacco: Never Used  . Alcohol use 0.0 oz/week     Comment: OCCASIONALLY   Objective:   BP 124/88 (BP Location: Right Arm, Patient Position: Sitting, Cuff Size: Large)   Pulse 82   Temp 98.1 F (36.7 C) (Oral)   Resp 16   Wt 189 lb 12.8 oz (86.1 kg)   BMI 28.86 kg/m   Physical Exam  Constitutional: He appears well-developed and well-nourished.  HENT:  Head: Normocephalic.  Eyes: Conjunctivae are normal.  Neck: Neck supple.  Cardiovascular: Normal rate and regular  rhythm.   Pulmonary/Chest: Effort normal and breath sounds normal.  Abdominal: Soft. Bowel sounds are normal.  Lymphadenopathy:    He has no cervical adenopathy.  Skin: There is erythema.  Slight soreness in right upper arm/elbow near injection site for pneumococcal vaccine 3 days ago. No itching.      Assessment & Plan:     1. Local reaction to pneumococcal vaccine, initial encounter Redness and soreness in right upper arm/elbow after pneumococcal vaccine 3 days ago. May use Advil or Aleve for soreness and cool compresses as needed. Recheck prn. Denies reaction in the left upper arm where he got his flu shot.

## 2015-12-09 ENCOUNTER — Ambulatory Visit (INDEPENDENT_AMBULATORY_CARE_PROVIDER_SITE_OTHER): Payer: PPO | Admitting: Urology

## 2015-12-09 VITALS — Ht 68.0 in | Wt 192.0 lb

## 2015-12-09 DIAGNOSIS — Z125 Encounter for screening for malignant neoplasm of prostate: Secondary | ICD-10-CM | POA: Diagnosis not present

## 2015-12-09 DIAGNOSIS — N400 Enlarged prostate without lower urinary tract symptoms: Secondary | ICD-10-CM | POA: Diagnosis not present

## 2015-12-09 DIAGNOSIS — N529 Male erectile dysfunction, unspecified: Secondary | ICD-10-CM | POA: Diagnosis not present

## 2015-12-09 DIAGNOSIS — I861 Scrotal varices: Secondary | ICD-10-CM | POA: Diagnosis not present

## 2015-12-09 LAB — BLADDER SCAN AMB NON-IMAGING: SCAN RESULT: 52

## 2015-12-09 NOTE — Progress Notes (Signed)
12/09/2015 1:52 PM   Benjamin Gutierrez [birth year] [id number]  Referring provider: Margo Common, [facility] [address] [place], [place] [address]  Chief Complaint  Patient presents with  . Follow-up    BPH    HPI: The patient is a 65 year old male presents for routine follow-up.  1. BPH Started on Rapaflo at his last visit. This was cost prohibitive service which the Flomax. He tissues with orthostatic hypotension so he stopped this medication. Currently he is off medications. He has minimal symptoms. His I PSS scores 5/1. He has nocturia 2 with mild intermittency and frequency.  2. Erectile dysfunction He was switched to generic sildenafil at his last appointment. In the past, he was on caialis but it was cost prohibitive. He only takes 20 mg of Cialis with good results. He takes more than that he gets a headache.  3. Prostate cancer screening -Due for DRE. PSA 0.9 in April 2017  3. Right varicocele -Renal ultrasound negative.       PMH: Past Medical History:  Diagnosis Date  . Acid reflux 08/06/2014  . Anxiety 08/06/2014  . Arthritis   . Blood in feces 08/06/2014  . 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 Decreased libido 08/06/2014  . Dizziness and giddiness 08/06/2014  . ED (erectile dysfunction) of organic origin 08/06/2014  . GERD (gastroesophageal reflux disease)   . H/O arthrodesis 08/06/2014  . Leg varices 08/06/2014    Surgical History: Past Surgical History:  Procedure Laterality Date  . CARDIAC CATHETERIZATION  1993  . CERVICAL SPINE SURGERY  2009   radiculopathy from past fusion and arthritic changes  . COLONOSCOPY  2013  . LAMINECTOMY  2005    Home Medications:    Medication List       Accurate as of 12/09/15  1:52 PM. Always use your most recent med list.          ibuprofen 200 MG tablet Commonly known as:  ADVIL,MOTRIN Take 3 tablets by mouth every 4 (four) hours as needed.   naproxen 500 MG EC tablet Commonly known as:  EC NAPROSYN Take 1  tablet (500 mg total) by mouth 2 (two) times daily with a meal.   nystatin-triamcinolone cream Commonly known as:  MYCOLOG II Apply 1 application topically 2 (two) times daily. As needed.   pantoprazole 40 MG tablet Commonly known as:  PROTONIX TAKE 1 TABLET (40 MG TOTAL) BY MOUTH DAILY.   sildenafil 20 MG tablet Commonly known as:  REVATIO Take 1 tablet (20 mg total) by mouth daily as needed (take 1 - 5 tablets daily as needed).   silodosin 8 MG Caps capsule Commonly known as:  RAPAFLO Take 1 capsule (8 mg total) by mouth daily with breakfast.   tamsulosin 0.4 MG Caps capsule Commonly known as:  FLOMAX Take 1 capsule (0.4 mg total) by mouth daily.   Vitamin D3 1000 units Caps Take 1 capsule by mouth daily.       Allergies:  Allergies  Allergen Reactions  . Iodine Itching  . Penicillins   . Povidone Iodine Itching    Family History: Family History  Problem Relation Age of Onset  . Bone cancer Mother   . Alcohol abuse Father   . Cancer Father     laryngeal cancer  . Bone cancer Brother   . Prostate cancer Brother   . Parkinson's disease Maternal Uncle   . Dementia Paternal Grandmother     Social History:  reports that he has never smoked. He has  never used smokeless tobacco. He reports that he drinks alcohol. He reports that he does not use drugs.  ROS: UROLOGY Frequent Urination?: Yes Hard to postpone urination?: No Burning/pain with urination?: No Get up at night to urinate?: Yes Leakage of urine?: No Urine stream starts and stops?: No Trouble starting stream?: No Do you have to strain to urinate?: No Blood in urine?: No Urinary tract infection?: No Sexually transmitted disease?: No Injury to kidneys or bladder?: No Painful intercourse?: No Weak stream?: No Erection problems?: No Penile pain?: No  Gastrointestinal Nausea?: No Vomiting?: No Indigestion/heartburn?: No Diarrhea?: No Constipation?: No  Constitutional Fever: No Night sweats?:  No Weight loss?: No Fatigue?: No  Skin Skin rash/lesions?: No Itching?: No  Eyes Blurred vision?: No Double vision?: No  Ears/Nose/Throat Sore throat?: No Sinus problems?: No  Hematologic/Lymphatic Swollen glands?: No Easy bruising?: No  Cardiovascular Leg swelling?: No Chest pain?: No  Respiratory Cough?: No Shortness of breath?: No  Endocrine Excessive thirst?: No  Musculoskeletal Back pain?: No Joint pain?: No  Neurological Headaches?: No Dizziness?: No  Psychologic Depression?: No Anxiety?: No  Physical Exam: Ht 5\' 8"  (1.727 m)   Wt 192 lb (87.1 kg)   BMI 29.19 kg/m   Constitutional:  Alert and oriented, No acute distress. HEENT: [place] AT, moist mucus membranes.  Trachea midline, no masses. Cardiovascular: No clubbing, cyanosis, or edema. Respiratory: Normal respiratory effort, no increased work of breathing. GI: Abdomen is soft, nontender, nondistended, no abdominal masses GU: No CVA tenderness. DRE: 2+ smooth benign Skin: No rashes, bruises or suspicious lesions. Lymph: No cervical or inguinal adenopathy. Neurologic: Grossly intact, no focal deficits, moving all 4 extremities. Psychiatric: Normal mood and affect.  Laboratory Data: Lab Results  Component Value Date   WBC 6.5 05/02/2015   HGB 14.6 05/13/2007   HCT 47.9 05/02/2015   MCV 88 05/02/2015   PLT 203 05/02/2015    Lab Results  Component Value Date   CREATININE 0.94 05/13/2007    No results found for: PSA  No results found for: TESTOSTERONE  No results found for: HGBA1C  Urinalysis    Component Value Date/Time   APPEARANCEUR Clear 11/05/2014 1031   GLUCOSEU Negative 11/05/2014 1031   BILIRUBINUR Negative 11/05/2014 1031   PROTEINUR Negative 11/05/2014 1031   UROBILINOGEN 0.2 09/24/2014 1123   NITRITE Negative 11/05/2014 1031   LEUKOCYTESUR Negative 11/05/2014 1031     Assessment & Plan:    1. Benign prostatic hypertrophy - asymptomatic. No treatment  necessary  2. ED (erectile dysfunction) of organic origin -  Continue sildenafil prn  3. Right varicocele -Renal ultrasound negative. No further work up required.   4. Prostate cancer screening Up to date. Due for DRE/PSA in October 2018  Return in about 1 year (around 12/08/2016) for with PSA prior.  Nickie Retort, MD  [facility] [address] [place], [place] [address] [phone]

## 2016-01-18 DIAGNOSIS — D2261 Melanocytic nevi of right upper limb, including shoulder: Secondary | ICD-10-CM | POA: Diagnosis not present

## 2016-01-18 DIAGNOSIS — D2271 Melanocytic nevi of right lower limb, including hip: Secondary | ICD-10-CM | POA: Diagnosis not present

## 2016-01-18 DIAGNOSIS — D485 Neoplasm of uncertain behavior of skin: Secondary | ICD-10-CM | POA: Diagnosis not present

## 2016-01-18 DIAGNOSIS — D0439 Carcinoma in situ of skin of other parts of face: Secondary | ICD-10-CM | POA: Diagnosis not present

## 2016-01-18 DIAGNOSIS — L570 Actinic keratosis: Secondary | ICD-10-CM | POA: Diagnosis not present

## 2016-01-18 DIAGNOSIS — D2272 Melanocytic nevi of left lower limb, including hip: Secondary | ICD-10-CM | POA: Diagnosis not present

## 2016-01-18 DIAGNOSIS — D225 Melanocytic nevi of trunk: Secondary | ICD-10-CM | POA: Diagnosis not present

## 2016-01-18 DIAGNOSIS — X32XXXA Exposure to sunlight, initial encounter: Secondary | ICD-10-CM | POA: Diagnosis not present

## 2016-02-10 ENCOUNTER — Encounter: Payer: Self-pay | Admitting: Family Medicine

## 2016-02-10 ENCOUNTER — Ambulatory Visit (INDEPENDENT_AMBULATORY_CARE_PROVIDER_SITE_OTHER): Payer: PPO | Admitting: Family Medicine

## 2016-02-10 VITALS — BP 124/82 | HR 80 | Temp 97.9°F | Resp 16 | Wt 190.0 lb

## 2016-02-10 DIAGNOSIS — M26629 Arthralgia of temporomandibular joint, unspecified side: Secondary | ICD-10-CM

## 2016-02-10 NOTE — Patient Instructions (Signed)
Temporomandibular Joint Syndrome Temporomandibular joint (TMJ) syndrome is a condition that affects the joints between your jaw and your skull. The TMJs are located near your ears and allow your jaw to open and close. These joints and the nearby muscles are involved in all movements of the jaw. People with TMJ syndrome have pain in the area of these joints and muscles. Chewing, biting, or other movements of the jaw can be difficult or painful. TMJ syndrome can be caused by various things. In many cases, the condition is mild and goes away within a few weeks. For some people, the condition can become a long-term problem. What are the causes? Possible causes of TMJ syndrome include:  Grinding your teeth or clenching your jaw. Some people do this when they are under stress.  Arthritis.  Injury to the jaw.  Head or neck injury.  Teeth or dentures that are not aligned well. In some cases, the cause of TMJ syndrome may not be known. What are the signs or symptoms? The most common symptom is an aching pain on the side of the head in the area of the TMJ. Other symptoms may include:  Pain when moving your jaw, such as when chewing or biting.  Being unable to open your jaw all the way.  Making a clicking sound when you open your mouth.  Headache.  Earache.  Neck or shoulder pain. How is this diagnosed? Diagnosis can usually be made based on your symptoms, your medical history, and a physical exam. Your health care provider may check the range of motion of your jaw. Imaging tests, such as X-rays or an MRI, are sometimes done. You may need to see your dentist to determine if your teeth and jaw are lined up correctly. How is this treated? TMJ syndrome often goes away on its own. If treatment is needed, the options may include:  Eating soft foods and applying ice or heat.  Medicines to relieve pain or inflammation.  Medicines to relax the muscles.  A splint, bite plate, or mouthpiece to  prevent teeth grinding or jaw clenching.  Relaxation techniques or counseling to help reduce stress.  Transcutaneous electrical nerve stimulation (TENS). This helps to relieve pain by applying an electrical current through the skin.  Acupuncture. This is sometimes helpful to relieve pain.  Jaw surgery. This is rarely needed. Follow these instructions at home:  Take medicines only as directed by your health care provider.  Eat a soft diet if you are having trouble chewing.  Apply ice to the painful area.  Put ice in a plastic bag.  Place a towel between your skin and the bag.  Leave the ice on for 20 minutes, 2-3 times a day.  Apply a warm compress to the painful area as directed.  Massage your jaw area and perform any jaw stretching exercises as recommended by your health care provider.  If you were given a mouthpiece or bite plate, wear it as directed.  Avoid foods that require a lot of chewing. Do not chew gum.  Keep all follow-up visits as directed by your health care provider. This is important. Contact a health care provider if:  You are having trouble eating.  You have new or worsening symptoms. Get help right away if:  Your jaw locks open or closed. This information is not intended to replace advice given to you by your health care provider. Make sure you discuss any questions you have with your health care provider. Document Released: 10/31/2000 Document  Revised: 10/06/2015 Document Reviewed: 09/10/2013 Elsevier Interactive Patient Education  2017 [employer] Inc[employer]  

## 2016-02-10 NOTE — Progress Notes (Signed)
Patient: Benjamin Gutierrez Male    DOB: [birth year]   65 y.o.   MRN: [id number] Visit Date: 02/10/2016  Today's Provider: Vernie Murders, PA   No chief complaint on file.  Subjective:    HPI   Pt comes in complaining of a "Knot" behind his left ear.  Pt says it has been there for over a year.  He says this week he noticed it getting bigger and becoming tender to touch.  He denies fevers, or drainage.     Patient Active Problem List   Diagnosis Date Noted  . Osteoarthrosis, hand 08/06/2014  . Anxiety 08/06/2014  . Blood in feces 08/06/2014  . Acid reflux 08/06/2014  . ED (erectile dysfunction) of organic origin 08/06/2014  . Dizziness and giddiness 08/06/2014  . H/O arthrodesis 08/06/2014  . Decreased libido 08/06/2014  . Avitaminosis D 08/06/2014  . Leg varices 08/06/2014  . 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 LBP (low back pain) 04/05/2006   Past Surgical History:  Procedure Laterality Date  . CARDIAC CATHETERIZATION  1993  . CERVICAL SPINE SURGERY  2009   radiculopathy from past fusion and arthritic changes  . COLONOSCOPY  2013  . LAMINECTOMY  2005   Family History  Problem Relation Age of Onset  . Bone cancer Mother   . Alcohol abuse Father   . Cancer Father     laryngeal cancer  . Bone cancer Brother   . Prostate cancer Brother   . Parkinson's disease Maternal Uncle   . Dementia Paternal Grandmother      Allergies  Allergen Reactions  . Iodine Itching  . Penicillins   . Povidone Iodine Itching    Current Outpatient Prescriptions:  .  Cholecalciferol (VITAMIN D3) 1000 UNITS CAPS, Take 1 capsule by mouth daily., Disp: , Rfl:  .  ibuprofen (ADVIL,MOTRIN) 200 MG tablet, Take 3 tablets by mouth every 4 (four) hours as needed., Disp: , Rfl:  .  naproxen (EC NAPROSYN) 500 MG EC tablet, Take 1 tablet (500 mg total) by mouth 2 (two) times daily with a meal., Disp: 60 tablet, Rfl: 3 .  nystatin-triamcinolone (MYCOLOG II) cream, Apply 1 application topically  2 (two) times daily. As needed., Disp: 30 g, Rfl: 0 .  pantoprazole (PROTONIX) 40 MG tablet, TAKE 1 TABLET (40 MG TOTAL) BY MOUTH DAILY., Disp: , Rfl: 1 .  sildenafil (REVATIO) 20 MG tablet, Take 1 tablet (20 mg total) by mouth daily as needed (take 1 - 5 tablets daily as needed). (Patient not taking: Reported on 12/09/2015), Disp: 90 tablet, Rfl: 3 .  silodosin (RAPAFLO) 8 MG CAPS capsule, Take 1 capsule (8 mg total) by mouth daily with breakfast. (Patient not taking: Reported on 12/09/2015), Disp: 30 capsule, Rfl: 11 .  tamsulosin (FLOMAX) 0.4 MG CAPS capsule, Take 1 capsule (0.4 mg total) by mouth daily. (Patient not taking: Reported on 12/09/2015), Disp: 30 capsule, Rfl: 3  Review of Systems  Constitutional: Negative.   HENT: Positive for tinnitus. Negative for congestion, ear discharge, ear pain, nosebleeds, postnasal drip, rhinorrhea, sinus pain, sinus pressure, sneezing, sore throat, trouble swallowing and voice change.   Musculoskeletal: Positive for neck pain (Chronic issue).  Neurological: Positive for headaches. Negative for dizziness and light-headedness.    Social History  Substance Use Topics  . Smoking status: Never Smoker  . Smokeless tobacco: Never Used  . Alcohol use 0.0 oz/week     Comment: OCCASIONALLY   Objective:   BP 124/82 (BP Location: Right  Arm, Patient Position: Sitting, Cuff Size: Normal)   Pulse 80   Temp 97.9 F (36.6 C) (Oral)   Resp 16   Wt 190 lb (86.2 kg)   BMI 28.89 kg/m   Physical Exam  Constitutional: He is oriented to person, place, and time. He appears well-developed and well-nourished. No distress.  HENT:  Head: Normocephalic and atraumatic.  Right Ear: Hearing normal.  Left Ear: Hearing normal.  Nose: Nose normal.  Eyes: Conjunctivae and lids are normal. Right eye exhibits no discharge. Left eye exhibits no discharge. No scleral icterus.  Pulmonary/Chest: Effort normal. No respiratory distress.  Musculoskeletal: Normal range of motion.    Some tenderness in posterior left TMJ. No local lymphadenopathy.  Neurological: He is alert and oriented to person, place, and time.  Skin: Skin is intact. No lesion noted.  Erythema from treatment of actinic keratoses bilateral temporal area.  Psychiatric: He has a normal mood and affect. His speech is normal and behavior is normal. Thought content normal.      Assessment & Plan:     1. TMJ syndrome Some soreness in the left posterior TMJ with intermittent lump sensation a couple times. Has a history of chewing a lot of gum in the past years. May use EC-Naproxen and moist heat applications prn. Restrict eating hard candies or chewing gum much.       Vernie Murders, PA  [facility]

## 2016-02-22 DIAGNOSIS — M25562 Pain in left knee: Secondary | ICD-10-CM | POA: Diagnosis not present

## 2016-03-16 DIAGNOSIS — L570 Actinic keratosis: Secondary | ICD-10-CM | POA: Diagnosis not present

## 2016-03-16 DIAGNOSIS — D0439 Carcinoma in situ of skin of other parts of face: Secondary | ICD-10-CM | POA: Diagnosis not present

## 2016-03-16 DIAGNOSIS — L821 Other seborrheic keratosis: Secondary | ICD-10-CM | POA: Diagnosis not present

## 2016-04-05 ENCOUNTER — Other Ambulatory Visit: Payer: Self-pay

## 2016-08-15 DIAGNOSIS — L570 Actinic keratosis: Secondary | ICD-10-CM | POA: Diagnosis not present

## 2016-08-15 DIAGNOSIS — X32XXXA Exposure to sunlight, initial encounter: Secondary | ICD-10-CM | POA: Diagnosis not present

## 2016-08-15 DIAGNOSIS — D2271 Melanocytic nevi of right lower limb, including hip: Secondary | ICD-10-CM | POA: Diagnosis not present

## 2016-08-15 DIAGNOSIS — D225 Melanocytic nevi of trunk: Secondary | ICD-10-CM | POA: Diagnosis not present

## 2016-08-15 DIAGNOSIS — B078 Other viral warts: Secondary | ICD-10-CM | POA: Diagnosis not present

## 2016-08-15 DIAGNOSIS — L821 Other seborrheic keratosis: Secondary | ICD-10-CM | POA: Diagnosis not present

## 2016-08-15 DIAGNOSIS — Z85828 Personal history of other malignant neoplasm of skin: Secondary | ICD-10-CM | POA: Diagnosis not present

## 2016-09-10 ENCOUNTER — Encounter: Payer: Self-pay | Admitting: Family Medicine

## 2016-09-10 ENCOUNTER — Ambulatory Visit (INDEPENDENT_AMBULATORY_CARE_PROVIDER_SITE_OTHER): Payer: PPO | Admitting: Family Medicine

## 2016-09-10 VITALS — BP 132/84 | HR 68 | Temp 97.6°F | Resp 16 | Ht 67.0 in | Wt 192.0 lb

## 2016-09-10 DIAGNOSIS — Z0000 Encounter for general adult medical examination without abnormal findings: Secondary | ICD-10-CM

## 2016-09-10 DIAGNOSIS — N401 Enlarged prostate with lower urinary tract symptoms: Secondary | ICD-10-CM | POA: Diagnosis not present

## 2016-09-10 DIAGNOSIS — N400 Enlarged prostate without lower urinary tract symptoms: Secondary | ICD-10-CM | POA: Insufficient documentation

## 2016-09-10 DIAGNOSIS — R3912 Poor urinary stream: Secondary | ICD-10-CM | POA: Diagnosis not present

## 2016-09-10 NOTE — Progress Notes (Signed)
Patient: Benjamin Gutierrez, Male    DOB: 1950-10-21, 66 y.o.   MRN: [id number] Visit Date: 09/10/2016  Today's Provider: Vernie Murders, PA   Chief Complaint  Patient presents with  . Medicare Wellness   Subjective:    Annual wellness visit Benjamin Gutierrez is a 66 y.o. male. He feels fairly well. He is c/o neck pain. He had 2 cervical surgeries, and his pain is returning. He would also like to discuss verruca that he has on his fingers. He reports exercising daily. He uses his Fitbit to walk about 5000 steps daily. He reports he is sleeping fairly well.  Last colonoscopy- 06/18/2014- tubular adenoma, internal bleeding hemorrhoids. Repeat 5 years. Dr. Allen Norris. -----------------------------------------------------------   Review of Systems  Constitutional: Negative.   HENT: Negative.   Eyes: Negative.   Respiratory: Negative.   Cardiovascular: Negative.   Gastrointestinal: Negative.   Endocrine: Negative.   Genitourinary: Negative.   Musculoskeletal: Positive for neck pain. Negative for arthralgias, back pain, gait problem, joint swelling, myalgias and neck stiffness.  Skin: Negative for color change, pallor, rash and wound.       Verruca present on fingers   Allergic/Immunologic: Negative.   Neurological: Negative.   Hematological: Negative.   Psychiatric/Behavioral: Negative.     Social History   Social History  . Marital status: Married    Spouse name: Benjamin Gutierrez  . Number of children: 2  . Years of education: 12   Occupational History  . Retired    Social History Main Topics  . Smoking status: Never Smoker  . Smokeless tobacco: Never Used  . Alcohol use 0.0 oz/week     Comment: OCCASIONALLY  . Drug use: No  . Sexual activity: Yes    Birth control/ protection: None   Other Topics Concern  . Not on file   Social History Narrative  . No narrative on file    Past Medical History:  Diagnosis Date  . Acid reflux 08/06/2014  . Anxiety 08/06/2014  .  Arthritis   . Blood in feces 08/06/2014  . 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 Decreased libido 08/06/2014  . Dizziness and giddiness 08/06/2014  . ED (erectile dysfunction) of organic origin 08/06/2014  . GERD (gastroesophageal reflux disease)   . H/O arthrodesis 08/06/2014  . Leg varices 08/06/2014     Patient Active Problem List   Diagnosis Date Noted  . Osteoarthrosis, hand 08/06/2014  . Anxiety 08/06/2014  . Blood in feces 08/06/2014  . Acid reflux 08/06/2014  . ED (erectile dysfunction) of organic origin 08/06/2014  . Dizziness and giddiness 08/06/2014  . H/O arthrodesis 08/06/2014  . Decreased libido 08/06/2014  . Avitaminosis D 08/06/2014  . Leg varices 08/06/2014  . 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 LBP (low back pain) 04/05/2006    Past Surgical History:  Procedure Laterality Date  . CARDIAC CATHETERIZATION  1993  . CERVICAL SPINE SURGERY  2009   radiculopathy from past fusion and arthritic changes  . COLONOSCOPY  2013  . ESOPHAGOGASTRODUODENOSCOPY ENDOSCOPY    . LAMINECTOMY  2005    His family history includes Alcohol abuse in his father; Bone cancer in his brother; Dementia in his paternal grandmother; Esophageal cancer in his mother; Parkinson's disease in his maternal uncle; Prostate cancer in his brother.      Current Outpatient Prescriptions:  .  ibuprofen (ADVIL,MOTRIN) 200 MG tablet, Take 3 tablets by mouth every 4 (four) hours as needed., Disp: , Rfl:  .  naproxen (EC  NAPROSYN) 500 MG EC tablet, Take 1 tablet (500 mg total) by mouth 2 (two) times daily with a meal., Disp: 60 tablet, Rfl: 3 .  pantoprazole (PROTONIX) 40 MG tablet, TAKE 1 TABLET (40 MG TOTAL) BY MOUTH DAILY., Disp: , Rfl: 1 .  nystatin-triamcinolone (MYCOLOG II) cream, Apply 1 application topically 2 (two) times daily. As needed. (Patient not taking: Reported on 09/10/2016), Disp: 30 g, Rfl: 0 .  sildenafil (REVATIO) 20 MG tablet, Take 1 tablet (20 mg total) by mouth daily as needed (take 1 - 5  tablets daily as needed). (Patient not taking: Reported on 12/09/2015), Disp: 90 tablet, Rfl: 3  Patient Care Team: Jacquita Mulhearn, Vickki Muff, PA as PCP - General (Physician Assistant)     Objective:   Vitals: BP 132/84 (BP Location: Right Arm, Patient Position: Sitting, Cuff Size: Large)   Pulse 68   Temp 97.6 F (36.4 C) (Oral)   Resp 16   Ht 5\' 7"  (1.702 m)   Wt 192 lb (87.1 kg)   SpO2 95%   BMI 30.07 kg/m   Physical Exam  Constitutional: He is oriented to person, place, and time. He appears well-developed and well-nourished.  HENT:  Head: Normocephalic and atraumatic.  Right Ear: External ear normal.  Left Ear: External ear normal.  Nose: Nose normal.  Mouth/Throat: Oropharynx is clear and moist.  Eyes: Pupils are equal, round, and reactive to light. Conjunctivae and EOM are normal. Right eye exhibits no discharge.  Neck: Normal range of motion. No tracheal deviation present. No thyromegaly present.  Slight stiffness with history of cervical laminectomy in 2005 then cervical fusion in 2009 by Dr. Trenton Gammon (neurosurgeon).  Cardiovascular: Normal rate, regular rhythm, normal heart sounds and intact distal pulses.   No murmur heard. Pulmonary/Chest: Effort normal and breath sounds normal. No respiratory distress. He has no wheezes. He has no rales. He exhibits no tenderness.  Abdominal: Soft. He exhibits no distension and no mass. There is no tenderness. There is no rebound and no guarding.  Genitourinary: Rectum normal and penis normal. Rectal exam shows guaiac negative stool.  Genitourinary Comments: Enlargement of prostate without nodules.  Musculoskeletal: Normal range of motion. He exhibits no edema or tenderness.  Some wasting of right hand interosseous muscles from past cervical radiculopathy, laminectomy and fusion.  Lymphadenopathy:    He has no cervical adenopathy.  Neurological: He is alert and oriented to person, place, and time. He has normal reflexes. No cranial nerve  deficit. He exhibits normal muscle tone. Coordination normal.  Skin: Skin is warm and dry. No rash noted. No erythema.  Psychiatric: He has a normal mood and affect. His behavior is normal. Judgment and thought content normal.   Activities of Daily Living In your present state of health, do you have any difficulty performing the following activities: 09/10/2016  Hearing? Y  Vision? N  Difficulty concentrating or making decisions? N  Walking or climbing stairs? N  Dressing or bathing? N  Doing errands, shopping? N  Some recent data might be hidden    Fall Risk Assessment Fall Risk  09/10/2016 08/15/2015  Falls in the past year? No No     Depression Screen PHQ 2/9 Scores 09/10/2016 08/15/2015  PHQ - 2 Score 0 0    Cognitive Testing - 6-CIT  Correct? Score   What year is it? yes 0 0 or 4  What month is it? yes 0 0 or 3  Memorize:    Pia Mau,  [address],  [address]  [place],      What time is it? (within 1 hour) yes 0 0 or 3  Count backwards from 20 yes 0 0, 2, or 4  Name the months of the year yes 0 0, 2, or 4  Repeat name & address above yes 0 0, 2, 4, 6, 8, or 10       TOTAL SCORE  0/28   Interpretation:  Normal  Normal (0-7) Abnormal (8-28)   Audit-C Alcohol Use Screening  Question Answer Points  How often do you have alcoholic drink? 1 times monthly 1  On days you do drink alcohol, how many drinks do you typically consume? 1 0  How oftey will you drink 6 or more in a total? never 0  Total Score:  1   A score of 3 or more in women, and 4 or more in men indicates increased risk for alcohol abuse, EXCEPT if all of the points are from question 1.      Assessment & Plan:     Annual Wellness Visit  Reviewed patient's Family Medical History Reviewed and updated list of patient's medical providers Assessment of cognitive impairment was done Assessed patient's functional ability Established a written schedule for health screening [place]  Completed and Reviewed  Exercise Activities and Dietary recommendations Goals    Continue low fat diet and walking exercise (5000 steps a day).      Immunization History  Administered Date(s) Administered  . H1N1 01/02/2008  . Pneumococcal Conjugate-13 05/28/2014  . Td 11/22/2004, 10/10/2010  . Tdap 10/10/2010  . Zoster 09/01/2010, 12/25/2012    Health Maintenance  Topic Date Due  . INFLUENZA VACCINE  09/19/2016  . TETANUS/TDAP  10/09/2020  . COLONOSCOPY  06/17/2024  . Hepatitis C Screening  Completed  . PNA vac Low Risk Adult  Completed     Discussed health benefits of physical activity, and encouraged him to engage in regular exercise appropriate for his age and condition.    ------------------------------------------------------------------------------------------------------------ 1. Encounter for Medicare annual wellness exam Stable general health. Wellness screening in good shape. Immunizations and colonoscopy up to date. Having more stiffness and "grinding" when he rotates head. Recommend he recheck with Dr. Trenton Gammon (neurosurgeon) to follow up past cervical fusion and laminectomy. Patient worried he may need additional surgery or cortisone injections. Will schedule follow up labs and evaluation of GERD.  2. Benign prostatic hyperplasia with weak urinary stream Followed by Dr. Pilar Jarvis (urologist) with known BPH without cancer. Did not tolerate Sildenafil because of severe headaches and Rapaflo/Flomax side effects were also intolerable. Has a follow up appointment with him this Fall.     Vernie Murders, PA  [facility]

## 2016-09-13 ENCOUNTER — Ambulatory Visit (INDEPENDENT_AMBULATORY_CARE_PROVIDER_SITE_OTHER): Payer: PPO | Admitting: Family Medicine

## 2016-09-13 ENCOUNTER — Encounter: Payer: Self-pay | Admitting: Family Medicine

## 2016-09-13 ENCOUNTER — Telehealth: Payer: Self-pay | Admitting: Family Medicine

## 2016-09-13 VITALS — BP 124/80 | HR 79 | Temp 97.8°F | Resp 16 | Wt 191.0 lb

## 2016-09-13 DIAGNOSIS — J029 Acute pharyngitis, unspecified: Secondary | ICD-10-CM

## 2016-09-13 DIAGNOSIS — K219 Gastro-esophageal reflux disease without esophagitis: Secondary | ICD-10-CM

## 2016-09-13 LAB — POCT RAPID STREP A (OFFICE): Rapid Strep A Screen: NEGATIVE

## 2016-09-13 MED ORDER — FIRST-DUKES MOUTHWASH MT SUSP: OROMUCOSAL | 0 refills | Status: DC

## 2016-09-13 NOTE — Telephone Encounter (Signed)
Pt's wife called saying the mouth wash that was called in today cost 100.00.  Is there a generic or some thing less expensive he can use.  He uses [facility]

## 2016-09-13 NOTE — Progress Notes (Signed)
Patient: Benjamin Gutierrez Male    DOB: 12/30/1950   66 y.o.   MRN: [id number] Visit Date: 09/13/2016  Today's Provider: Vernie Murders, PA   Chief Complaint  Patient presents with  . Sore Throat   Subjective:    Sore Throat   This is a new problem. The current episode started yesterday. The problem has been gradually worsening. Neither side of throat is experiencing more pain than the other. There has been no fever. The pain is at a severity of 2/10 (exacerbated with swallowing; can get up to a 7/10 while swallowing). Associated symptoms include coughing (dry), neck pain (chronic) and trouble swallowing. Pertinent negatives include no abdominal pain, congestion, diarrhea, ear discharge, ear pain, headaches, hoarse voice, plugged ear sensation, shortness of breath, swollen glands or vomiting. Associated symptoms comments: Rhinorrhea present. He has had no exposure to strep. He has tried gargles for the symptoms. The treatment provided mild relief.   Patient Active Problem List   Diagnosis Date Noted  . BPH (benign prostatic hyperplasia) 09/10/2016  . Osteoarthrosis, hand 08/06/2014  . Anxiety 08/06/2014  . Blood in feces 08/06/2014  . Acid reflux 08/06/2014  . ED (erectile dysfunction) of organic origin 08/06/2014  . Dizziness and giddiness 08/06/2014  . H/O arthrodesis 08/06/2014  . Decreased libido 08/06/2014  . Avitaminosis D 08/06/2014  . Leg varices 08/06/2014  . 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 LBP (low back pain) 04/05/2006   Past Surgical History:  Procedure Laterality Date  . CARDIAC CATHETERIZATION  1993  . CERVICAL SPINE SURGERY  2009   radiculopathy from past fusion and arthritic changes  . COLONOSCOPY  2013  . ESOPHAGOGASTRODUODENOSCOPY ENDOSCOPY    . LAMINECTOMY  2005   Family History  Problem Relation Age of Onset  . Esophageal cancer Mother   . Alcohol abuse Father   . Bone cancer Brother   . Prostate cancer Brother   . Parkinson's disease  Maternal Uncle   . Dementia Paternal Grandmother       Allergies  Allergen Reactions  . Iodine Itching  . Penicillins   . Povidone Iodine Itching    Current Outpatient Prescriptions:  .  ibuprofen (ADVIL,MOTRIN) 200 MG tablet, Take 3 tablets by mouth every 4 (four) hours as needed., Disp: , Rfl:  .  naproxen (EC NAPROSYN) 500 MG EC tablet, Take 1 tablet (500 mg total) by mouth 2 (two) times daily with a meal., Disp: 60 tablet, Rfl: 3 .  pantoprazole (PROTONIX) 40 MG tablet, TAKE 1 TABLET (40 MG TOTAL) BY MOUTH DAILY., Disp: , Rfl: 1 .  nystatin-triamcinolone (MYCOLOG II) cream, Apply 1 application topically 2 (two) times daily. As needed. (Patient not taking: Reported on 09/10/2016), Disp: 30 g, Rfl: 0 .  sildenafil (REVATIO) 20 MG tablet, Take 1 tablet (20 mg total) by mouth daily as needed (take 1 - 5 tablets daily as needed). (Patient not taking: Reported on 12/09/2015), Disp: 90 tablet, Rfl: 3  Review of Systems  HENT: Positive for rhinorrhea and trouble swallowing. Negative for congestion, ear discharge, ear pain and hoarse voice.   Respiratory: Positive for cough (dry). Negative for shortness of breath.   Gastrointestinal: Negative for abdominal pain, diarrhea and vomiting.  Musculoskeletal: Positive for neck pain (chronic).  Neurological: Negative for headaches.    Social History  Substance Use Topics  . Smoking status: Never Smoker  . Smokeless tobacco: Never Used  . Alcohol use 0.0 oz/week     Comment: OCCASIONALLY  Objective:   BP 124/80 (BP Location: Left Arm, Patient Position: Sitting, Cuff Size: Large)   Pulse 79   Temp 97.8 F (36.6 C) (Oral)   Resp 16   Wt 191 lb (86.6 kg)   BMI 29.91 kg/m  Vitals:   09/13/16 1144  BP: 124/80  Pulse: 79  Resp: 16  Temp: 97.8 F (36.6 C)  TempSrc: Oral  Weight: 191 lb (86.6 kg)   Physical Exam  Constitutional: He appears well-developed and well-nourished.  HENT:  Head: Normocephalic.  Right Ear: External ear  normal.  Left Ear: External ear normal.  Nose: Nose normal.  Slightly injected/dilated vessels in the posterior pharynx. No exudates or enlarged tonsils.  Eyes: Conjunctivae are normal.  Neck: Neck supple.  Cardiovascular: Normal rate and regular rhythm.   Pulmonary/Chest: Effort normal and breath sounds normal.  Abdominal: Soft. Bowel sounds are normal.      Assessment & Plan:     1. Sore throat Onset yesterday without fever or congestion. Strep test is negative but has some irritation in posterior pharynx on exam. No exudates. Will treat with Duke's Mouthwash and recheck prn. - POCT rapid strep A - Diphenhyd-Hydrocort-Nystatin (FIRST-DUKES MOUTHWASH) SUSP; Gargle 1 teaspoon then swallow after meals and bedtime.  Dispense: 237 mL; Refill: 0  2. Gastroesophageal reflux disease, esophagitis presence not specified Uses Protonix occasionally for dyspepsia. Advised to elevate head of bed, reduce caffeine consumption, don't eat late or overfill stomach and get back on the Protonix daily. May need referral to GI for endoscopic evaluation if symptoms persist.       Vernie Murders, [place]

## 2016-09-13 NOTE — Telephone Encounter (Signed)
Please review-aa 

## 2016-09-14 NOTE — Telephone Encounter (Signed)
Patient's wife Judy advised.  °

## 2016-09-14 NOTE — Telephone Encounter (Signed)
Insurance must not be helping at all. May try OTC liquid Benadryl and Liquid Maalox (one teaspoon each to gargle then swallow 3 times a day).

## 2016-09-14 NOTE — Telephone Encounter (Signed)
Please see below and let patient know-aa

## 2016-09-20 ENCOUNTER — Encounter: Payer: Self-pay | Admitting: Family Medicine

## 2016-09-20 ENCOUNTER — Ambulatory Visit (INDEPENDENT_AMBULATORY_CARE_PROVIDER_SITE_OTHER): Payer: PPO | Admitting: Family Medicine

## 2016-09-20 VITALS — BP 112/76 | HR 83 | Temp 97.5°F | Wt 190.0 lb

## 2016-09-20 DIAGNOSIS — J029 Acute pharyngitis, unspecified: Secondary | ICD-10-CM

## 2016-09-20 MED ORDER — FIRST-DUKES MOUTHWASH MT SUSP: OROMUCOSAL | 0 refills | Status: DC

## 2016-09-20 NOTE — Progress Notes (Signed)
Patient: Benjamin Gutierrez Male    DOB: 1950/03/02   66 y.o.   MRN: [id number] Visit Date: 09/20/2016  Today's Provider: Vernie Murders, PA   Chief Complaint  Patient presents with  . Sore Throat   Subjective:    Sore Throat   This is a new problem. The current episode started in the past 7 days. The problem has been gradually improving. Neither side of throat is experiencing more pain than the other. There has been no fever. Treatments tried: Duke's magic mouthwash. The treatment provided mild relief.  Patient was seen in office on 09/13/2016 for same issue. He was advised to use Duke's magic mouthwash. He states mouthwash helped soothe the sore throat. Patient reports he is experiencing a burning sensation in his throat, pain has improved today.   Patient Active Problem List   Diagnosis Date Noted  . BPH (benign prostatic hyperplasia) 09/10/2016  . Osteoarthrosis, hand 08/06/2014  . Anxiety 08/06/2014  . Blood in feces 08/06/2014  . Acid reflux 08/06/2014  . ED (erectile dysfunction) of organic origin 08/06/2014  . Dizziness and giddiness 08/06/2014  . H/O arthrodesis 08/06/2014  . Decreased libido 08/06/2014  . Avitaminosis D 08/06/2014  . Leg varices 08/06/2014  . 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 LBP (low back pain) 04/05/2006   Past Surgical History:  Procedure Laterality Date  . CARDIAC CATHETERIZATION  1993  . CERVICAL SPINE SURGERY  2009   radiculopathy from past fusion and arthritic changes  . COLONOSCOPY  2013  . ESOPHAGOGASTRODUODENOSCOPY ENDOSCOPY    . LAMINECTOMY  2005   Family History  Problem Relation Age of Onset  . Esophageal cancer Mother   . Alcohol abuse Father   . Bone cancer Brother   . Prostate cancer Brother   . Parkinson's disease Maternal Uncle   . Dementia Paternal Grandmother    Allergies  Allergen Reactions  . Iodine Itching  . Penicillins   . Povidone Iodine Itching     Previous Medications   DIPHENHYD-HYDROCORT-NYSTATIN  (FIRST-DUKES MOUTHWASH) SUSP    Gargle 1 teaspoon then swallow after meals and bedtime.   IBUPROFEN (ADVIL,MOTRIN) 200 MG TABLET    Take 3 tablets by mouth every 4 (four) hours as needed.   NAPROXEN (EC NAPROSYN) 500 MG EC TABLET    Take 1 tablet (500 mg total) by mouth 2 (two) times daily with a meal.   NYSTATIN-TRIAMCINOLONE (MYCOLOG II) CREAM    Apply 1 application topically 2 (two) times daily. As needed.   PANTOPRAZOLE (PROTONIX) 40 MG TABLET    TAKE 1 TABLET (40 MG TOTAL) BY MOUTH DAILY.   SILDENAFIL (REVATIO) 20 MG TABLET    Take 1 tablet (20 mg total) by mouth daily as needed (take 1 - 5 tablets daily as needed).    Review of Systems  Constitutional: Negative.   HENT: Positive for sore throat.   Respiratory: Negative.   Cardiovascular: Negative.     Social History  Substance Use Topics  . Smoking status: Never Smoker  . Smokeless tobacco: Never Used  . Alcohol use 0.0 oz/week     Comment: OCCASIONALLY   Objective:   BP 112/76 (BP Location: Right Arm, Patient Position: Sitting, Cuff Size: Normal)   Pulse 83   Temp (!) 97.5 F (36.4 C) (Oral)   Wt 190 lb (86.2 kg)   SpO2 97%   BMI 29.76 kg/m   Physical Exam  Constitutional: He is oriented to person, place, and time. He appears well-developed and well-nourished. No  distress.  HENT:  Head: Normocephalic and atraumatic.  Right Ear: Hearing and external ear normal.  Left Ear: Hearing and external ear normal.  Nose: Nose normal.  Mouth/Throat: Oropharynx is clear and moist.  Minimal dilated vessels in the posterior pharynx remains.  Eyes: Conjunctivae and lids are normal. Right eye exhibits no discharge. Left eye exhibits no discharge. No scleral icterus.  Pulmonary/Chest: Effort normal. No respiratory distress.  Musculoskeletal: Normal range of motion.  Neurological: He is alert and oriented to person, place, and time.  Skin: Skin is intact. No lesion and no rash noted.  Psychiatric: He has a normal mood and affect.  His speech is normal and behavior is normal. Thought content normal.      Assessment & Plan:     1. Sore throat Improvement but still some burning sensation. Improved by exam. Did not get the original Rx for the Duke's Mouthwash. Used Maalox and Benadryl liquid (equal parts) with slight relief. Wants written prescription to try to get the Duke's Mouthwash by a GoodRx discount card. - Diphenhyd-Hydrocort-Nystatin (FIRST-DUKES MOUTHWASH) SUSP; Gargle 1 teaspoon then swallow after meals and bedtime.  Dispense: 237 mL; Refill: 0

## 2016-10-02 ENCOUNTER — Other Ambulatory Visit: Payer: Self-pay | Admitting: Family Medicine

## 2016-10-02 ENCOUNTER — Telehealth: Payer: Self-pay | Admitting: Family Medicine

## 2016-10-02 DIAGNOSIS — J029 Acute pharyngitis, unspecified: Secondary | ICD-10-CM

## 2016-10-02 MED ORDER — FIRST-DUKES MOUTHWASH MT SUSP: OROMUCOSAL | 0 refills | Status: DC

## 2016-10-02 NOTE — Telephone Encounter (Signed)
May refill same dosage and quantity.

## 2016-10-02 NOTE — Telephone Encounter (Signed)
Pt contacted office for refill request on the following medications:  Diphenhyd-Hydrocort-Nystatin (FIRST-DUKES MOUTHWASH) [place].  CB#[phone]/MW

## 2016-10-02 NOTE — Telephone Encounter (Signed)
Left patient's wife Bethena Roys a message advising her that RX has been sent to pharmacy.

## 2016-10-15 ENCOUNTER — Telehealth: Payer: Self-pay | Admitting: Family Medicine

## 2016-10-15 NOTE — Telephone Encounter (Signed)
Patient seen you a couple of weeks ago for a throat issue.  Was given Duke's magic mouthwash. Has used all of the refills and patient is still having problems with throat.   Does he need another appt with you to re-evaluate??

## 2016-10-15 NOTE — Telephone Encounter (Signed)
Should re-evaluate to see if ENT referral needed.

## 2016-10-16 ENCOUNTER — Encounter: Payer: Self-pay | Admitting: Family Medicine

## 2016-10-16 NOTE — Telephone Encounter (Signed)
Advised patient he should have ENT look at his throat since this has been going on for a month and not improving any further. He wants to try to get an appointment with Dr. Dari[facility] on his own. Advised him to let us know if he can't get the appointment on his own (he knows an employee there).

## 2016-10-18 ENCOUNTER — Encounter: Payer: Self-pay | Admitting: Family Medicine

## 2016-10-19 ENCOUNTER — Other Ambulatory Visit: Payer: Self-pay | Admitting: Family Medicine

## 2016-10-19 ENCOUNTER — Telehealth: Payer: Self-pay | Admitting: Family Medicine

## 2016-10-19 DIAGNOSIS — J029 Acute pharyngitis, unspecified: Secondary | ICD-10-CM

## 2016-10-19 MED ORDER — FIRST-DUKES MOUTHWASH MT SUSP: OROMUCOSAL | 0 refills | Status: DC

## 2016-10-19 NOTE — Telephone Encounter (Signed)
pt needs refill on Dukes mouth wash [facility]  Thanks teri

## 2016-10-29 DIAGNOSIS — K219 Gastro-esophageal reflux disease without esophagitis: Secondary | ICD-10-CM | POA: Diagnosis not present

## 2016-10-29 DIAGNOSIS — R070 Pain in throat: Secondary | ICD-10-CM | POA: Diagnosis not present

## 2016-11-05 ENCOUNTER — Other Ambulatory Visit: Payer: Self-pay | Admitting: Otolaryngology

## 2016-11-05 DIAGNOSIS — R1310 Dysphagia, unspecified: Secondary | ICD-10-CM

## 2016-11-15 ENCOUNTER — Ambulatory Visit
Admission: RE | Admit: 2016-11-15 | Discharge: 2016-11-15 | Disposition: A | Discharge status: Home / Self Care | Payer: PPO | Source: Ambulatory Visit | Attending: Otolaryngology | Admitting: Otolaryngology

## 2016-11-15 DIAGNOSIS — Z981 Arthrodesis status: Secondary | ICD-10-CM | POA: Insufficient documentation

## 2016-11-15 DIAGNOSIS — R1310 Dysphagia, unspecified: Secondary | ICD-10-CM | POA: Diagnosis not present

## 2016-11-15 DIAGNOSIS — M5030 Other cervical disc degeneration, unspecified cervical region: Secondary | ICD-10-CM | POA: Insufficient documentation

## 2016-11-15 HISTORY — DX: Malignant (primary) neoplasm, unspecified: C80.1

## 2016-11-15 LAB — POCT I-STAT CREATININE: Creatinine, Ser: 1.2 mg/dL (ref 0.61–1.24)

## 2016-11-15 MED ORDER — IOPAMIDOL (ISOVUE-300) INJECTION 61%
75.0000 mL | Freq: Once | INTRAVENOUS | Status: AC | PRN
  Administered 2016-11-15: 75 mL via INTRAVENOUS

## 2016-11-19 ENCOUNTER — Other Ambulatory Visit: Payer: Self-pay | Admitting: Otolaryngology

## 2016-11-19 DIAGNOSIS — R070 Pain in throat: Secondary | ICD-10-CM | POA: Diagnosis not present

## 2016-11-19 DIAGNOSIS — R220 Localized swelling, mass and lump, head: Secondary | ICD-10-CM | POA: Diagnosis not present

## 2016-11-19 DIAGNOSIS — R221 Localized swelling, mass and lump, neck: Secondary | ICD-10-CM

## 2016-11-21 ENCOUNTER — Other Ambulatory Visit: Payer: Self-pay | Admitting: Radiology

## 2016-11-23 ENCOUNTER — Ambulatory Visit
Admission: RE | Admit: 2016-11-23 | Discharge: 2016-11-23 | Disposition: A | Discharge status: Home / Self Care | Payer: PPO | Source: Ambulatory Visit | Attending: Otolaryngology | Admitting: Otolaryngology

## 2016-11-23 DIAGNOSIS — D37039 Neoplasm of uncertain behavior of the major salivary glands, unspecified: Secondary | ICD-10-CM | POA: Diagnosis not present

## 2016-11-23 DIAGNOSIS — Z791 Long term (current) use of non-steroidal anti-inflammatories (NSAID): Secondary | ICD-10-CM | POA: Diagnosis not present

## 2016-11-23 DIAGNOSIS — Z820 Family history of epilepsy and other diseases of the nervous system: Secondary | ICD-10-CM | POA: Insufficient documentation

## 2016-11-23 DIAGNOSIS — M5412 Radiculopathy, cervical region: Secondary | ICD-10-CM | POA: Diagnosis not present

## 2016-11-23 DIAGNOSIS — Z79899 Other long term (current) drug therapy: Secondary | ICD-10-CM | POA: Diagnosis not present

## 2016-11-23 DIAGNOSIS — Z800 Family history of malignant neoplasm of digestive organs: Secondary | ICD-10-CM | POA: Diagnosis not present

## 2016-11-23 DIAGNOSIS — M799 Soft tissue disorder, unspecified: Secondary | ICD-10-CM | POA: Diagnosis not present

## 2016-11-23 DIAGNOSIS — Z811 Family history of alcohol abuse and dependence: Secondary | ICD-10-CM | POA: Diagnosis not present

## 2016-11-23 DIAGNOSIS — D490 Neoplasm of unspecified behavior of digestive system: Secondary | ICD-10-CM | POA: Diagnosis not present

## 2016-11-23 DIAGNOSIS — M1990 Unspecified osteoarthritis, unspecified site: Secondary | ICD-10-CM | POA: Diagnosis not present

## 2016-11-23 DIAGNOSIS — Z880 Allergy status to penicillin: Secondary | ICD-10-CM | POA: Insufficient documentation

## 2016-11-23 DIAGNOSIS — K219 Gastro-esophageal reflux disease without esophagitis: Secondary | ICD-10-CM | POA: Insufficient documentation

## 2016-11-23 DIAGNOSIS — Z888 Allergy status to other drugs, medicaments and biological substances status: Secondary | ICD-10-CM | POA: Insufficient documentation

## 2016-11-23 DIAGNOSIS — R599 Enlarged lymph nodes, unspecified: Secondary | ICD-10-CM | POA: Diagnosis not present

## 2016-11-23 DIAGNOSIS — Z9889 Other specified postprocedural states: Secondary | ICD-10-CM | POA: Insufficient documentation

## 2016-11-23 DIAGNOSIS — Z808 Family history of malignant neoplasm of other organs or systems: Secondary | ICD-10-CM | POA: Diagnosis not present

## 2016-11-23 DIAGNOSIS — R221 Localized swelling, mass and lump, neck: Secondary | ICD-10-CM | POA: Diagnosis not present

## 2016-11-23 DIAGNOSIS — Z8042 Family history of malignant neoplasm of prostate: Secondary | ICD-10-CM | POA: Insufficient documentation

## 2016-11-23 DIAGNOSIS — F419 Anxiety disorder, unspecified: Secondary | ICD-10-CM | POA: Diagnosis not present

## 2016-11-23 DIAGNOSIS — K118 Other diseases of salivary glands: Secondary | ICD-10-CM | POA: Insufficient documentation

## 2016-11-23 LAB — CBC
HCT: 44.4 % (ref 40.0–52.0)
Hemoglobin: 15.3 g/dL (ref 13.0–18.0)
MCH: 30.2 pg (ref 26.0–34.0)
MCHC: 34.5 g/dL (ref 32.0–36.0)
MCV: 87.5 fL (ref 80.0–100.0)
PLATELETS: 175 10*3/uL (ref 150–440)
RBC: 5.08 MIL/uL (ref 4.40–5.90)
RDW: 13.4 % (ref 11.5–14.5)
WBC: 5.8 10*3/uL (ref 3.8–10.6)

## 2016-11-23 LAB — PROTIME-INR
INR: 0.92
Prothrombin Time: 12.3 seconds (ref 11.4–15.2)

## 2016-11-23 LAB — APTT: aPTT: 27 seconds (ref 24–36)

## 2016-11-23 MED ORDER — FENTANYL CITRATE (PF) 100 MCG/2ML IJ SOLN
INTRAMUSCULAR | Status: AC
  Filled 2016-11-23: qty 2

## 2016-11-23 MED ORDER — SODIUM CHLORIDE 0.9 % IV SOLN
INTRAVENOUS | Status: DC
  Administered 2016-11-23: 11:00:00 via INTRAVENOUS

## 2016-11-23 MED ORDER — MIDAZOLAM HCL 2 MG/2ML IJ SOLN
INTRAMUSCULAR | Status: AC
  Filled 2016-11-23: qty 2

## 2016-11-23 MED ORDER — MIDAZOLAM HCL 2 MG/2ML IJ SOLN
INTRAMUSCULAR | Status: AC | PRN
  Administered 2016-11-23: 1 mg via INTRAVENOUS

## 2016-11-23 NOTE — Discharge Instructions (Signed)
Needle Biopsy, Care After Refer to this sheet in the next few weeks. These instructions provide you with information about caring for yourself after your procedure. Your health care provider may also give you more specific instructions. Your treatment has been planned according to current medical practices, but problems sometimes occur. Call your health care provider if you have any problems or questions after your procedure. What can I expect after the procedure? After your procedure, it is common to have soreness, bruising, or mild pain at the biopsy site. This should go away in a few days. Follow these instructions at home:  Rest as directed by your health care provider.  Take medicines only as directed by your health care provider.  Remove bandaid tomorrow and reapply clean one after shower/bath.   Check your biopsy site every day for signs of infection. Watch for: ? Redness, swelling, or pain. ? Fluid, blood, or pus. Contact a health care provider if:  You have a fever.  You have redness, swelling, or pain at the biopsy site that lasts longer than a few days.  You have fluid, blood, or pus coming from the biopsy site.  You feel nauseous.  You vomit. Get help right away if:  You have shortness of breath.  You have trouble breathing.  You have chest pain.  You feel dizzy or you faint.  You have bleeding that does not stop with pressure or a bandage.  You cough up blood.  You have pain in your abdomen. This information is not intended to replace advice given to you by your health care provider. Make sure you discuss any questions you have with your health care provider. Document Released: 06/22/2014 Document Revised: 07/14/2015 Document Reviewed: 02/01/2014 Elsevier Interactive Patient Education  [employer].

## 2016-11-23 NOTE — Procedures (Signed)
Interventional Radiology Procedure Note  Procedure: US guided core biopsy of left periauricular lymph node  Complications: None  Estimated Blood Loss: < 10 mL  25 G FNA x 3 followed by 18 G core biopsy x 3 of 1.5 cm left posterior periauricular lymph node.  Venetia Night. Kathlene Cote, M.D Pager:  [phone]

## 2016-11-23 NOTE — H&P (Signed)
Chief Complaint: Patient was seen in consultation today for left parotid mass biopsy at the request of [place]  Referring Physician(s): Vaught,Creighton  Patient Status: [facility] - Out-pt  History of Present Illness: Benjamin Gutierrez is a 66 y.o. male with a history of waxing and waning palpable nodule in the left auricular region.  CT of neck demonstrates well-circumscribed 1.7 cm solid nodule in superficial parotid gland representing either lymph node or parotid origin lesion.  He presents for US guided biopsy today.  Currently asymptomatic.  Past Medical History:  Diagnosis Date  . Acid reflux 08/06/2014  . Anxiety 08/06/2014  . Arthritis   . Blood in feces 08/06/2014  . 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 Cancer ([place])    skin ca  . Decreased libido 08/06/2014  . Dizziness and giddiness 08/06/2014  . ED (erectile dysfunction) of organic origin 08/06/2014  . GERD (gastroesophageal reflux disease)   . H/O arthrodesis 08/06/2014  . Leg varices 08/06/2014    Past Surgical History:  Procedure Laterality Date  . CARDIAC CATHETERIZATION  1993  . CERVICAL SPINE SURGERY  2009   radiculopathy from past fusion and arthritic changes  . COLONOSCOPY  2013  . ESOPHAGOGASTRODUODENOSCOPY ENDOSCOPY    . LAMINECTOMY  2005    Allergies: Fentanyl; Penicillins; and Povidone iodine  Medications: Prior to Admission medications   Medication Sig Start Date End Date Taking? Authorizing Provider  dexlansoprazole (DEXILANT) 60 MG capsule Take 60 mg by mouth daily.   Yes [provider]  naproxen (EC NAPROSYN) 500 MG EC tablet Take 1 tablet (500 mg total) by mouth 2 (two) times daily with a meal. 05/02/15  Yes Chrismon, Vickki Muff, PA  Diphenhyd-Hydrocort-Nystatin (FIRST-DUKES MOUTHWASH) SUSP Gargle 1 teaspoon then swallow after meals and bedtime. 10/19/16   Chrismon, Vickki Muff, PA  ibuprofen (ADVIL,MOTRIN) 200 MG tablet Take 3 tablets by mouth every 4 (four) hours as needed.    [provider]  nystatin-triamcinolone (MYCOLOG II) cream Apply 1 application topically 2 (two) times daily. As needed. Patient not taking: Reported on 09/10/2016 08/09/14   Chrismon, Vickki Muff, PA  pantoprazole (PROTONIX) 40 MG tablet TAKE 1 TABLET (40 MG TOTAL) BY MOUTH DAILY. 12/01/14   [provider]  sildenafil (REVATIO) 20 MG tablet Take 1 tablet (20 mg total) by mouth daily as needed (take 1 - 5 tablets daily as needed). Patient not taking: Reported on 12/09/2015 06/09/15   Nickie Retort, MD     Family History  Problem Relation Age of Onset  . Esophageal cancer Mother   . Alcohol abuse Father   . Bone cancer Brother   . Prostate cancer Brother   . Parkinson's disease Maternal Uncle   . Dementia Paternal Grandmother     Social History   Social History  . Marital status: Married    Spouse name: Bethena Roys  . Number of children: 2  . Years of education: 12   Occupational History  . Retired    Social History Main Topics  . Smoking status: Never Smoker  . Smokeless tobacco: Never Used  . Alcohol use 0.0 oz/week     Comment: OCCASIONALLY  . Drug use: No  . Sexual activity: Yes    Birth control/ protection: None   Other Topics Concern  . None   Social History Narrative  . None    ECOG Status: 0 - Asymptomatic  Review of Systems: A 12 point ROS discussed and pertinent positives are indicated in the HPI above.  All  other systems are negative.  Review of Systems  Constitutional: Negative.   HENT: Negative.   Respiratory: Negative.   Cardiovascular: Negative.   Gastrointestinal: Negative.   Genitourinary: Negative.   Musculoskeletal: Negative.   Neurological: Negative.     Vital Signs: BP 124/85   Pulse 78   Temp 98.4 F (36.9 C) (Oral)   Resp 16   Ht 5\' 8"  (1.727 m)   Wt 186 lb (84.4 kg)   SpO2 95%   BMI 28.28 kg/m   Physical Exam  Constitutional: He is oriented to person, place, and time. He appears well-developed and well-nourished. No  distress.  HENT:  Head: Normocephalic and atraumatic.  Unable to palpate left parotid nodule.  Neck: Neck supple. No JVD present. No thyromegaly present.  Cardiovascular: Normal rate, regular rhythm and normal heart sounds.  Exam reveals no gallop and no friction rub.   Pulmonary/Chest: Effort normal and breath sounds normal. No stridor. No respiratory distress. He has no wheezes. He has no rales.  Abdominal: Soft. Bowel sounds are normal. He exhibits no distension and no mass. There is no tenderness. There is no rebound and no guarding.  Musculoskeletal: He exhibits no edema.  Lymphadenopathy:    He has no cervical adenopathy.  Neurological: He is alert and oriented to person, place, and time.  Skin: Skin is warm and dry. He is not diaphoretic.  Vitals reviewed.   Imaging: Ct Soft Tissue Neck W Contrast  Result Date: 11/15/2016 CLINICAL DATA:  Odynophagia EXAM: CT NECK WITH CONTRAST TECHNIQUE: Multidetector CT imaging of the neck was performed using the standard protocol following the bolus administration of intravenous contrast. CONTRAST:  68mL ISOVUE-300 IOPAMIDOL (ISOVUE-300) INJECTION 61% COMPARISON:  None. FINDINGS: Pharynx and larynx: Normal. No mass or swelling. Salivary glands: 17 mm enhancing soft tissue nodule just below the external auditory canal on the left. This could be a parotid lesion but could also be an extra parotid lymph node. This has irregular enhancement and biopsy is suggested Remainder of the left parotid gland normal. Right parotid normal. Submandibular gland fatty replaced and without mass lesion Thyroid: Negative Lymph nodes: Aside from the nodule below the left external auditory canal, no worrisome lymph nodes in the neck Vascular: Negative Limited intracranial: Negative Visualized orbits: Not imaged Mastoids and visualized paranasal sinuses: Negative Skeleton: ACDF C3-4 and C4-5. If pseudarthrosis at C4-5. Solid fusion C5-6 and C6-7. Disc degeneration and expands  extensive spurring at C6-7. No acute skeletal lesion. Upper chest: Lung apices clear.  No mediastinal mass Other: None IMPRESSION: 17 mm soft tissue nodule below the left external auditory canal. Possible parotid tumor versus extra parotid lymph node. Based on size and enhancement pattern, biopsy suggested. Mucosal evaluation suggested to evaluate for pharyngeal neoplasm. Cervical fusion C3 through C7 with pseudarthrosis at C4-5 Electronically Signed   By: Franchot Gallo M.D.   On: 11/15/2016 09:35    Labs:  CBC: No results for input(s): WBC, HGB, HCT, PLT in the last 8760 hours.  COAGS: No results for input(s): INR, APTT in the last 8760 hours.  BMP:  Recent Labs  11/15/16 0810  CREATININE 1.20    Assessment and Plan:  For US guided FNA biopsy and possible core biopsy of left parotid mass/lymph node today.    Risks and benefits discussed with the patient including, but not limited to bleeding, infection, damage to adjacent structures or low yield requiring additional tests. All of the patient's questions were answered, patient is agreeable to proceed. Consent signed and  in chart.  Thank you for this interesting consult.  I greatly enjoyed meeting Benjamin Gutierrez and look forward to participating in their care.  A copy of this report was sent to the requesting provider on this date.  Electronically Signed: Azzie Roup, MD 11/23/2016, 11:31 AM   I spent a total of 30 Minutes in face to face in clinical consultation, greater than 50% of which was counseling/coordinating care for biopsy of left parotid lesion.

## 2016-11-23 NOTE — Sedation Documentation (Signed)
Dr Kathlene Cote notified of pt reaction to Fentanyl patch with reported drop in respirations, bp and passing out (within minutes of application)

## 2016-11-26 LAB — SURGICAL PATHOLOGY

## 2016-11-26 LAB — CYTOLOGY - NON PAP

## 2016-11-27 ENCOUNTER — Encounter: Payer: Self-pay | Admitting: Interventional Radiology

## 2016-12-04 DIAGNOSIS — D37030 Neoplasm of uncertain behavior of the parotid salivary glands: Secondary | ICD-10-CM | POA: Diagnosis not present

## 2016-12-07 ENCOUNTER — Encounter: Payer: Self-pay | Admitting: Family Medicine

## 2016-12-07 ENCOUNTER — Ambulatory Visit (INDEPENDENT_AMBULATORY_CARE_PROVIDER_SITE_OTHER): Payer: PPO | Admitting: Family Medicine

## 2016-12-07 ENCOUNTER — Encounter
Admission: RE | Admit: 2016-12-07 | Discharge: 2016-12-07 | Disposition: A | Discharge status: Home / Self Care | Payer: PPO | Source: Ambulatory Visit | Attending: Otolaryngology | Admitting: Otolaryngology

## 2016-12-07 VITALS — BP 110/76 | HR 83 | Temp 98.4°F | Wt 184.8 lb

## 2016-12-07 DIAGNOSIS — E559 Vitamin D deficiency, unspecified: Secondary | ICD-10-CM | POA: Diagnosis not present

## 2016-12-07 DIAGNOSIS — M5412 Radiculopathy, cervical region: Secondary | ICD-10-CM | POA: Diagnosis not present

## 2016-12-07 DIAGNOSIS — Z01812 Encounter for preprocedural laboratory examination: Secondary | ICD-10-CM | POA: Insufficient documentation

## 2016-12-07 DIAGNOSIS — Z01818 Encounter for other preprocedural examination: Secondary | ICD-10-CM

## 2016-12-07 DIAGNOSIS — F419 Anxiety disorder, unspecified: Secondary | ICD-10-CM | POA: Diagnosis not present

## 2016-12-07 DIAGNOSIS — K118 Other diseases of salivary glands: Secondary | ICD-10-CM

## 2016-12-07 DIAGNOSIS — N529 Male erectile dysfunction, unspecified: Secondary | ICD-10-CM | POA: Diagnosis not present

## 2016-12-07 DIAGNOSIS — Z8739 Personal history of other diseases of the musculoskeletal system and connective tissue: Secondary | ICD-10-CM

## 2016-12-07 DIAGNOSIS — Z01810 Encounter for preprocedural cardiovascular examination: Secondary | ICD-10-CM | POA: Insufficient documentation

## 2016-12-07 DIAGNOSIS — K119 Disease of salivary gland, unspecified: Secondary | ICD-10-CM | POA: Diagnosis not present

## 2016-12-07 DIAGNOSIS — K219 Gastro-esophageal reflux disease without esophagitis: Secondary | ICD-10-CM | POA: Diagnosis not present

## 2016-12-07 DIAGNOSIS — R42 Dizziness and giddiness: Secondary | ICD-10-CM | POA: Diagnosis not present

## 2016-12-07 DIAGNOSIS — I8390 Asymptomatic varicose veins of unspecified lower extremity: Secondary | ICD-10-CM | POA: Insufficient documentation

## 2016-12-07 DIAGNOSIS — R6882 Decreased libido: Secondary | ICD-10-CM | POA: Insufficient documentation

## 2016-12-07 DIAGNOSIS — N400 Enlarged prostate without lower urinary tract symptoms: Secondary | ICD-10-CM | POA: Insufficient documentation

## 2016-12-07 DIAGNOSIS — R9431 Abnormal electrocardiogram [ECG] [EKG]: Secondary | ICD-10-CM | POA: Diagnosis not present

## 2016-12-07 LAB — BASIC METABOLIC PANEL
ANION GAP: 10 (ref 5–15)
BUN: 16 mg/dL (ref 6–20)
CALCIUM: 9.7 mg/dL (ref 8.9–10.3)
CHLORIDE: 103 mmol/L (ref 101–111)
CO2: 28 mmol/L (ref 22–32)
CREATININE: 0.99 mg/dL (ref 0.61–1.24)
GFR calc non Af Amer: >60 mL/min (ref >60)
Glucose, Bld: 91 mg/dL (ref 65–99)
Potassium: 4.8 mmol/L (ref 3.5–5.1)
SODIUM: 141 mmol/L (ref 135–145)

## 2016-12-07 NOTE — Progress Notes (Signed)
Patient: Benjamin Gutierrez Male    DOB: January 04, 1951   66 y.o.   MRN: [id number] Visit Date: 12/07/2016  Today's Provider: Vernie Murders, PA   Chief Complaint  Patient presents with  . Surgical Clearance   Subjective:    HPI Patient is here for surgical clearance. He is having left superficial parotidectomy surgery on 12/13/16 by Dr. Pryor Ochoa. No concerns about the surgery. He will be not be released until the next day to go home. He does not smoke or drinks alcohol. No cardiac or neurologic symptoms at all. Patient had Pre-Op at the hospital this morning. EKG and labs were done at that time.   Past Medical History:  Diagnosis Date  . Acid reflux 08/06/2014  . Anxiety 08/06/2014  . Arthritis   . Blood in feces 08/06/2014  . 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 Decreased libido 08/06/2014  . Dizziness and giddiness 08/06/2014  . ED (erectile dysfunction) of organic origin 08/06/2014  . GERD (gastroesophageal reflux disease)   . H/O arthrodesis 08/06/2014  . Leg varices 08/06/2014   Past Surgical History:  Procedure Laterality Date  . CARDIAC CATHETERIZATION  1993  . CERVICAL SPINE SURGERY  2009   radiculopathy from past fusion and arthritic changes  . COLONOSCOPY  2013  . ESOPHAGOGASTRODUODENOSCOPY ENDOSCOPY    . LAMINECTOMY  2005   Family History  Problem Relation Age of Onset  . Esophageal cancer Mother   . Alcohol abuse Father   . Bone cancer Brother   . Prostate cancer Brother   . Parkinson's disease Maternal Uncle   . Dementia Paternal Grandmother    Allergies  Allergen Reactions  . Betadine [Povidone Iodine] Itching  . Fentanyl Other (See Comments)    Pain patch and after passed out and admitted to the hospital  . Penicillins Rash     Previous Medications   CHLORHEXIDINE (PERIDEX) 0.12 % SOLUTION    Use as directed 15 mLs in the mouth or throat daily.   DEXLANSOPRAZOLE (DEXILANT) 60 MG CAPSULE    Take 60 mg by mouth daily.   IBUPROFEN  (ADVIL,MOTRIN) 200 MG TABLET    Take 400 mg by mouth every 6 (six) hours as needed for headache or moderate pain.   NAPROXEN SODIUM (ANAPROX) 220 MG TABLET    Take 220 mg by mouth 2 (two) times daily as needed (for back pain).   NEOMYCIN-BACITRACIN-POLYMYXIN (NEOSPORIN) OINTMENT    Apply 1 application topically as needed for wound care.   NYSTATIN-TRIAMCINOLONE (MYCOLOG II) CREAM    Apply 1 application topically 2 (two) times daily. As needed.   SILDENAFIL (REVATIO) 20 MG TABLET    Take 1 tablet (20 mg total) by mouth daily as needed (take 1 - 5 tablets daily as needed).    Review of Systems  Constitutional: Negative.   Respiratory: Negative.   Cardiovascular: Negative.     Social History  Substance Use Topics  . Smoking status: Never Smoker  . Smokeless tobacco: Never Used  . Alcohol use 0.0 oz/week     Comment: rarely   Objective:   BP 110/76 (BP Location: Right Arm, Patient Position: Sitting, Cuff Size: Normal)   Pulse 83   Temp 98.4 F (36.9 C) (Oral)   Wt 184 lb 12.8 oz (83.8 kg)   SpO2 96%   BMI 28.10 kg/m   Physical Exam  Constitutional: He is oriented to person, place, and time. He appears well-developed and well-nourished.  HENT:  Head: Normocephalic.  Right Ear: External ear normal.  Left Ear: External ear normal.  Nose: Nose normal.  Mouth/Throat: Oropharynx is clear and moist.  Eyes: Conjunctivae and EOM are normal.  Neck: Neck supple.  Left parotid mass under left ear. No pain. Well healed scar anterior neck from cervical fusion in 2009.  Cardiovascular: Normal rate.   Pulmonary/Chest: Effort normal and breath sounds normal.  Abdominal: Soft. Bowel sounds are normal.  Neurological: He is alert and oriented to person, place, and time. He has normal reflexes.      Assessment & Plan:     1. Preoperative clearance Scheduled for left superficial parotidectomy for 17 mm mass below left auditory canal on 12-13-16 by Dr. Pryor Ochoa (ENT). Biopsy reports have been  indeterminate. History of cervical fusion in 2009. Medically cleared with low risk for anesthesia.  Results for orders placed or performed during the hospital encounter of 12/07/16 (from the past 72 hour(s))  Basic metabolic panel     Status: None   Collection Time: 12/07/16 11:38 AM  Result Value Ref Range   Sodium 141 135 - 145 mmol/L   Potassium 4.8 3.5 - 5.1 mmol/L   Chloride 103 101 - 111 mmol/L   CO2 28 22 - 32 mmol/L   Glucose, Bld 91 65 - 99 mg/dL   BUN 16 6 - 20 mg/dL   Creatinine, Ser 0.99 0.61 - 1.24 mg/dL   Calcium 9.7 8.9 - 10.3 mg/dL   GFR calc non Af Amer >60 >60 mL/min   GFR calc Af Amer >60 >60 mL/min    Comment: (NOTE) The eGFR has been calculated using the CKD EPI equation. This calculation has not been validated in all clinical situations. eGFR's persistently <60 mL/min signify possible Chronic Kidney Disease.    Anion gap 10 5 - 15   Lab Results  Component Value Date   WBC 5.8 11/23/2016   HGB 15.3 11/23/2016   HCT 44.4 11/23/2016   MCV 87.5 11/23/2016   PLT 175 11/23/2016    2. Parotid mass Evaluated and needle biopsy by Dr. Pryor Ochoa with inconclusive results. Excision planned as above.  3. History of TMJ syndrome Left TMJ syndrome flare on 02-10-16. No recent discomfort or joint locking.

## 2016-12-07 NOTE — Patient Instructions (Signed)
Your procedure is scheduled on: Thursday, December 13, 2016 Report to Same Day Surgery on the 2nd floor in the [facility]. To find out your arrival time, please call [phone] between 1PM - 3PM on: Wednesday, December 12, 2016  REMEMBER: Instructions that are not followed completely may result in serious medical risk up to and including death; or upon the discretion of your surgeon and anesthesiologist your surgery may need to be rescheduled.  Do not eat food or drink liquids after midnight. No gum chewing or hard candies.  You may however, drink CLEAR liquids up to 2 hours before you are scheduled to arrive at the hospital for your procedure.  Do not drink clear liquids within 2 hours of your scheduled arrival to the hospital as this may lead to your procedure being delayed or rescheduled.  Clear liquids include: - water  - apple juice without pulp - clear gatorade - black coffee or tea (NO milk, creamers, sugars) DO NOT drink anything not on this list.  No Alcohol for 24 hours before or after surgery.  No Smoking for 24 hours prior to surgery.  Notify your doctor if there is any change in your medical condition (cold, fever, infection).  Do not wear jewelry, make-up, hairpins, clips or nail polish.  Do not wear lotions, powders, or perfumes.   Do not shave 48 hours prior to surgery. Men may shave face and neck.  Contacts and dentures may not be worn into surgery.  Do not bring valuables to the hospital. [facility] is not responsible for any belongings or valuables.  TAKE THESE MEDICATIONS THE MORNING OF SURGERY WITH A SIP OF WATER:  1.  DEXILANT (take one capsule the night before surgery and one capsule the morning of surgery) - helps to prevent nausea after surgery.  NOW!  Stop Anti-inflammatories such as Advil, Aleve, Ibuprofen, Motrin, Naproxen, Naprosyn, Goodie powder, or aspirin products. (May take Tylenol or Acetaminophen if needed for pain.)  NOW!  Stop over the  counter supplements until after surgery.  If you are being admitted to the hospital overnight, leave your suitcase in the car. After surgery it may be brought to your room.  If you are taking public transportation, you will need to have a responsible adult to with you.  Please call the number above if you have any questions about these instructions.

## 2016-12-10 ENCOUNTER — Ambulatory Visit: Payer: PPO | Admitting: Family Medicine

## 2016-12-11 ENCOUNTER — Ambulatory Visit (INDEPENDENT_AMBULATORY_CARE_PROVIDER_SITE_OTHER): Payer: PPO | Admitting: Cardiovascular Disease

## 2016-12-11 ENCOUNTER — Encounter: Payer: Self-pay | Admitting: Cardiovascular Disease

## 2016-12-11 ENCOUNTER — Telehealth: Payer: Self-pay | Admitting: Cardiovascular Disease

## 2016-12-11 VITALS — BP 140/76 | HR 73 | Ht 68.0 in | Wt 188.0 lb

## 2016-12-11 DIAGNOSIS — Z7689 Persons encountering health services in other specified circumstances: Secondary | ICD-10-CM | POA: Diagnosis not present

## 2016-12-11 DIAGNOSIS — Z01810 Encounter for preprocedural cardiovascular examination: Secondary | ICD-10-CM | POA: Diagnosis not present

## 2016-12-11 NOTE — Telephone Encounter (Signed)
Routed OV notes which address low risk for upcoming procedure to [facility], [phone]

## 2016-12-11 NOTE — Patient Instructions (Addendum)
Medication Instructions:  Your physician recommends that you continue on your current medications as directed. Please refer to the Current Medication list given to you today.   Labwork: none  Testing/Procedures: none  Follow-Up: Your physician recommends that you schedule a follow-up appointment as needed with Dr. Fletcher Anon.    Any Other Special Instructions Will Be Listed Below (If Applicable). You have been cleared for surgery from a cardiac standpoint as a low risk     If you need a refill on your cardiac medications before your next appointment, please call your pharmacy.

## 2016-12-11 NOTE — Progress Notes (Signed)
Cardiology Office Note   Date:  12/11/2016   ID:  Benjamin Gutierrez, DOB 1950-05-02, MRN [id number]  PCP:  Margo Common, PA  Cardiologist:   Kathlyn Sacramento, MD   Chief Complaint  Patient presents with  . other    New patient. Per dr Pryor Ochoa for surgical clearance. Patient denies chest pain and SOB. Meds reviewed verbally with patient.       History of Present Illness: Benjamin Gutierrez is a 66 y.o. male who was referred for preoperative cardiovascular evaluation before superficial parotidectomy.  The patient has no previous cardiac history and has no frequent chronic risk factors.  He has been relatively healthy throughout his life.  He denies any chest pain or shortness of breath.  He is physically active and walks at least 5000 steps daily for exercise.  He has no exertional symptoms. He is not a smoker and does not drink alcohol.  There is no family history of coronary artery disease. He is referred because recent EKG showed lateral T wave changes.  However, repeat EKG is completely normal.    Past Medical History:  Diagnosis Date  . Acid reflux 08/06/2014  . Anxiety 08/06/2014  . Arthritis   . Blood in feces 08/06/2014  . 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 Decreased libido 08/06/2014  . Dizziness and giddiness 08/06/2014  . ED (erectile dysfunction) of organic origin 08/06/2014  . GERD (gastroesophageal reflux disease)   . H/O arthrodesis 08/06/2014  . Leg varices 08/06/2014    Past Surgical History:  Procedure Laterality Date  . CARDIAC CATHETERIZATION  1993  . CERVICAL SPINE SURGERY  2009   radiculopathy from past fusion and arthritic changes  . COLONOSCOPY  2013  . ESOPHAGOGASTRODUODENOSCOPY ENDOSCOPY    . LAMINECTOMY  2005     Current Outpatient Prescriptions  Medication Sig Dispense Refill  . chlorhexidine (PERIDEX) 0.12 % solution Use as directed 15 mLs in the mouth or throat daily.    Marland Kitchen dexlansoprazole (DEXILANT) 60 MG capsule  Take 60 mg by mouth daily.    Marland Kitchen ibuprofen (ADVIL,MOTRIN) 200 MG tablet Take 400 mg by mouth every 6 (six) hours as needed for headache or moderate pain.    . naproxen sodium (ANAPROX) 220 MG tablet Take 220 mg by mouth 2 (two) times daily as needed (for back pain).    Marland Kitchen neomycin-bacitracin-polymyxin (NEOSPORIN) ointment Apply 1 application topically as needed for wound care.     No current facility-administered medications for this visit.     Allergies:   Betadine [povidone iodine]; Fentanyl; and Penicillins    Social History:  The patient  reports that he has never smoked. He has never used smokeless tobacco. He reports that he drinks alcohol. He reports that he does not use drugs.   Family History:  The patient's family history includes Alcohol abuse in his father; Bone cancer in his brother; Dementia in his paternal grandmother; Esophageal cancer in his mother; Parkinson's disease in his maternal uncle; Prostate cancer in his brother.    ROS:  Please see the history of present illness.   Otherwise, review of systems are positive for none.   All other systems are reviewed and negative.    PHYSICAL EXAM: VS:  BP 140/76 (BP Location: Left Arm, Patient Position: Sitting, Cuff Size: Normal)   Pulse 73   Ht 5\' 8"  (1.727 m)   Wt 188 lb (85.3 kg)   BMI 28.59 kg/m  ,  BMI Body mass index is 28.59 kg/m. GEN: Well nourished, well developed, in no acute distress  HEENT: normal  Neck: no JVD, carotid bruits, or masses Cardiac: RRR; no murmurs, rubs, or gallops,no edema  Respiratory:  clear to auscultation bilaterally, normal work of breathing GI: soft, nontender, nondistended, + BS MS: no deformity or atrophy  Skin: warm and dry, no rash Neuro:  Strength and sensation are intact Psych: euthymic mood, full affect   EKG:  EKG is ordered today. The ekg ordered today demonstrates normal sinus rhythm with no significant ST or T wave changes.   Recent Labs: 11/23/2016: Hemoglobin 15.3;  Platelets 175 12/07/2016: BUN 16; Creatinine, Ser 0.99; Potassium 4.8; Sodium 141    Lipid Panel No results found for: CHOL, TRIG, HDL, CHOLHDL, VLDL, LDLCALC, LDLDIRECT    Wt Readings from Last 3 Encounters:  12/11/16 188 lb (85.3 kg)  12/07/16 186 lb (84.4 kg)  12/07/16 184 lb 12.8 oz (83.8 kg)      No flowsheet data found.    ASSESSMENT AND PLAN:  1.  Preoperative cardiovascular evaluation: The patient has no cardiac symptoms and his functional capacity is very good.  His EKG is normal.  Recent EKG could have been abnormal due to lead misplacement. The patient can proceed with surgery as an overall low risk from a cardiac standpoint.  No need for stress testing or other cardiac testing.    Disposition:   FU with me as needed.   Signed,  Kathlyn Sacramento, MD  12/11/2016 1:39 PM    [place]

## 2016-12-13 ENCOUNTER — Ambulatory Visit: Payer: PPO | Admitting: Certified Registered Nurse Anesthetist

## 2016-12-13 ENCOUNTER — Encounter
Admission: RE | Disposition: A | Discharge status: Home / Self Care | Payer: Self-pay | Source: Ambulatory Visit | Attending: Otolaryngology

## 2016-12-13 ENCOUNTER — Observation Stay
Admission: RE | Admit: 2016-12-13 | Discharge: 2016-12-15 | Disposition: A | Discharge status: Home / Self Care | Payer: PPO | Source: Ambulatory Visit | Attending: Otolaryngology | Admitting: Otolaryngology

## 2016-12-13 ENCOUNTER — Observation Stay: Payer: PPO

## 2016-12-13 DIAGNOSIS — I739 Peripheral vascular disease, unspecified: Secondary | ICD-10-CM | POA: Insufficient documentation

## 2016-12-13 DIAGNOSIS — Z880 Allergy status to penicillin: Secondary | ICD-10-CM | POA: Diagnosis not present

## 2016-12-13 DIAGNOSIS — I6522 Occlusion and stenosis of left carotid artery: Secondary | ICD-10-CM | POA: Insufficient documentation

## 2016-12-13 DIAGNOSIS — J9601 Acute respiratory failure with hypoxia: Secondary | ICD-10-CM | POA: Insufficient documentation

## 2016-12-13 DIAGNOSIS — R7981 Abnormal blood-gas level: Secondary | ICD-10-CM

## 2016-12-13 DIAGNOSIS — F419 Anxiety disorder, unspecified: Secondary | ICD-10-CM | POA: Insufficient documentation

## 2016-12-13 DIAGNOSIS — K219 Gastro-esophageal reflux disease without esophagitis: Secondary | ICD-10-CM | POA: Diagnosis not present

## 2016-12-13 DIAGNOSIS — D110 Benign neoplasm of parotid gland: Secondary | ICD-10-CM | POA: Diagnosis not present

## 2016-12-13 DIAGNOSIS — I899 Noninfective disorder of lymphatic vessels and lymph nodes, unspecified: Secondary | ICD-10-CM | POA: Diagnosis not present

## 2016-12-13 DIAGNOSIS — J9811 Atelectasis: Secondary | ICD-10-CM | POA: Diagnosis not present

## 2016-12-13 DIAGNOSIS — Z9889 Other specified postprocedural states: Secondary | ICD-10-CM

## 2016-12-13 DIAGNOSIS — R55 Syncope and collapse: Secondary | ICD-10-CM

## 2016-12-13 HISTORY — PX: PAROTIDECTOMY: SHX2163

## 2016-12-13 SURGERY — EXCISION, PAROTID GLAND
Anesthesia: Choice | Laterality: Left

## 2016-12-13 MED ORDER — FENTANYL CITRATE (PF) 100 MCG/2ML IJ SOLN
INTRAMUSCULAR | Status: AC
  Filled 2016-12-13: qty 2

## 2016-12-13 MED ORDER — ONDANSETRON HCL 4 MG/2ML IJ SOLN
INTRAMUSCULAR | Status: DC | PRN
  Administered 2016-12-13: 4 mg via INTRAVENOUS

## 2016-12-13 MED ORDER — HYDROMORPHONE HCL 1 MG/ML IJ SOLN
INTRAMUSCULAR | Status: DC | PRN
  Administered 2016-12-13: 0.5 mg via INTRAVENOUS
  Administered 2016-12-13: .25 mg via INTRAVENOUS
  Administered 2016-12-13: 0.5 mg via INTRAVENOUS
  Administered 2016-12-13: .25 mg via INTRAVENOUS

## 2016-12-13 MED ORDER — DOCUSATE SODIUM 100 MG PO CAPS
100.0000 mg | ORAL_CAPSULE | Freq: Two times a day (BID) | ORAL | Status: DC
  Administered 2016-12-13 – 2016-12-15 (×5): 100 mg via ORAL
  Filled 2016-12-13 (×5): qty 1

## 2016-12-13 MED ORDER — DEXTROSE-NACL 5-0.2 % IV SOLN
INTRAVENOUS | Status: DC
  Administered 2016-12-13: 14:00:00 via INTRAVENOUS

## 2016-12-13 MED ORDER — DEXAMETHASONE SODIUM PHOSPHATE 10 MG/ML IJ SOLN
INTRAMUSCULAR | Status: DC | PRN
  Administered 2016-12-13: 10 mg via INTRAVENOUS

## 2016-12-13 MED ORDER — BACITRACIN ZINC 500 UNIT/GM EX OINT
1.0000 "application " | TOPICAL_OINTMENT | Freq: Three times a day (TID) | CUTANEOUS | Status: DC
  Administered 2016-12-13 – 2016-12-15 (×6): 1 via TOPICAL
  Filled 2016-12-13: qty 28.35

## 2016-12-13 MED ORDER — ACETAMINOPHEN 650 MG RE SUPP: 650.0000 mg | RECTAL | Status: DC | PRN

## 2016-12-13 MED ORDER — SUCCINYLCHOLINE CHLORIDE 20 MG/ML IJ SOLN
INTRAMUSCULAR | Status: AC
  Filled 2016-12-13: qty 1

## 2016-12-13 MED ORDER — IPRATROPIUM-ALBUTEROL 0.5-2.5 (3) MG/3ML IN SOLN
3.0000 mL | RESPIRATORY_TRACT | Status: DC
  Administered 2016-12-13: 3 mL via RESPIRATORY_TRACT

## 2016-12-13 MED ORDER — BACITRACIN ZINC 500 UNIT/GM EX OINT
TOPICAL_OINTMENT | CUTANEOUS | Status: DC | PRN
  Administered 2016-12-13: 1 via TOPICAL

## 2016-12-13 MED ORDER — MIDAZOLAM HCL 2 MG/2ML IJ SOLN
INTRAMUSCULAR | Status: AC
  Filled 2016-12-13: qty 2

## 2016-12-13 MED ORDER — REMIFENTANIL HCL 1 MG IV SOLR
INTRAVENOUS | Status: AC
  Filled 2016-12-13: qty 1000

## 2016-12-13 MED ORDER — PROPOFOL 10 MG/ML IV BOLUS
INTRAVENOUS | Status: DC | PRN
  Administered 2016-12-13: 170 mg via INTRAVENOUS

## 2016-12-13 MED ORDER — GLYCOPYRROLATE 0.2 MG/ML IJ SOLN
INTRAMUSCULAR | Status: DC | PRN
  Administered 2016-12-13: 0.2 mg via INTRAVENOUS

## 2016-12-13 MED ORDER — ACETAMINOPHEN 160 MG/5ML PO SOLN
650.0000 mg | ORAL | Status: DC | PRN
  Filled 2016-12-13 (×2): qty 20.3

## 2016-12-13 MED ORDER — MIDAZOLAM HCL 2 MG/2ML IJ SOLN
INTRAMUSCULAR | Status: DC | PRN
  Administered 2016-12-13: 2 mg via INTRAVENOUS

## 2016-12-13 MED ORDER — IPRATROPIUM-ALBUTEROL 0.5-2.5 (3) MG/3ML IN SOLN
RESPIRATORY_TRACT | Status: AC
  Administered 2016-12-13: 3 mL via RESPIRATORY_TRACT
  Filled 2016-12-13: qty 3

## 2016-12-13 MED ORDER — HYDROMORPHONE HCL 1 MG/ML IJ SOLN
INTRAMUSCULAR | Status: AC
  Filled 2016-12-13: qty 1

## 2016-12-13 MED ORDER — PHENYLEPHRINE HCL 10 MG/ML IJ SOLN
INTRAMUSCULAR | Status: DC | PRN
  Administered 2016-12-13: 50 ug via INTRAVENOUS
  Administered 2016-12-13: 100 ug via INTRAVENOUS
  Administered 2016-12-13: 50 ug via INTRAVENOUS
  Administered 2016-12-13: 100 ug via INTRAVENOUS

## 2016-12-13 MED ORDER — PROPOFOL 10 MG/ML IV BOLUS
INTRAVENOUS | Status: AC
  Filled 2016-12-13: qty 20

## 2016-12-13 MED ORDER — BACITRACIN ZINC 500 UNIT/GM EX OINT
TOPICAL_OINTMENT | CUTANEOUS | Status: AC
  Filled 2016-12-13: qty 28.35

## 2016-12-13 MED ORDER — CHLORHEXIDINE GLUCONATE 0.12 % MT SOLN
15.0000 mL | Freq: Every day | OROMUCOSAL | Status: DC
  Administered 2016-12-14 – 2016-12-15 (×2): 15 mL via OROMUCOSAL
  Filled 2016-12-13 (×2): qty 15

## 2016-12-13 MED ORDER — ACETAMINOPHEN 10 MG/ML IV SOLN
INTRAVENOUS | Status: AC
  Filled 2016-12-13: qty 100

## 2016-12-13 MED ORDER — LIDOCAINE HCL (PF) 2 % IJ SOLN
INTRAMUSCULAR | Status: AC
  Filled 2016-12-13: qty 10

## 2016-12-13 MED ORDER — PANTOPRAZOLE SODIUM 40 MG PO TBEC
40.0000 mg | DELAYED_RELEASE_TABLET | Freq: Every day | ORAL | Status: DC
  Administered 2016-12-14 – 2016-12-15 (×2): 40 mg via ORAL
  Filled 2016-12-13 (×2): qty 1

## 2016-12-13 MED ORDER — ACETAMINOPHEN 10 MG/ML IV SOLN
INTRAVENOUS | Status: DC | PRN
  Administered 2016-12-13: 1000 mg via INTRAVENOUS

## 2016-12-13 MED ORDER — SODIUM CHLORIDE 0.9 % IV SOLN
INTRAVENOUS | Status: DC | PRN
  Administered 2016-12-13: .2 ug/kg/min via INTRAVENOUS

## 2016-12-13 MED ORDER — ONDANSETRON HCL 4 MG/2ML IJ SOLN
4.0000 mg | INTRAMUSCULAR | Status: DC | PRN
  Administered 2016-12-13: 4 mg via INTRAVENOUS
  Filled 2016-12-13: qty 2

## 2016-12-13 MED ORDER — MORPHINE SULFATE (PF) 2 MG/ML IV SOLN: 2.0000 mg | INTRAVENOUS | Status: DC | PRN

## 2016-12-13 MED ORDER — EPHEDRINE SULFATE 50 MG/ML IJ SOLN
INTRAMUSCULAR | Status: DC | PRN
Start: 2016-12-13 — End: 2016-12-13
  Administered 2016-12-13: 10 mg via INTRAVENOUS
  Administered 2016-12-13 (×2): 5 mg via INTRAVENOUS

## 2016-12-13 MED ORDER — ONDANSETRON HCL 4 MG/2ML IJ SOLN: 4.0000 mg | Freq: Once | INTRAMUSCULAR | Status: DC | PRN

## 2016-12-13 MED ORDER — ONDANSETRON HCL 4 MG PO TABS: 4.0000 mg | ORAL_TABLET | ORAL | Status: DC | PRN

## 2016-12-13 MED ORDER — LIDOCAINE-EPINEPHRINE 1 %-1:100000 IJ SOLN
INTRAMUSCULAR | Status: DC | PRN
  Administered 2016-12-13: 9 mL

## 2016-12-13 MED ORDER — SODIUM CHLORIDE 0.9 % IV SOLN
INTRAVENOUS | Status: DC | PRN
  Administered 2016-12-13: 15 ug/min via INTRAVENOUS

## 2016-12-13 MED ORDER — ZOLPIDEM TARTRATE 5 MG PO TABS: 5.0000 mg | ORAL_TABLET | Freq: Every evening | ORAL | Status: DC | PRN

## 2016-12-13 MED ORDER — PROMETHAZINE HCL 25 MG PO TABS
25.0000 mg | ORAL_TABLET | Freq: Four times a day (QID) | ORAL | Status: DC | PRN
  Administered 2016-12-13: 25 mg via ORAL
  Filled 2016-12-13: qty 1

## 2016-12-13 MED ORDER — LACTATED RINGERS IV SOLN
INTRAVENOUS | Status: DC
  Administered 2016-12-13: 07:00:00 via INTRAVENOUS

## 2016-12-13 MED ORDER — LIDOCAINE HCL (CARDIAC) 20 MG/ML IV SOLN
INTRAVENOUS | Status: DC | PRN
  Administered 2016-12-13: 80 mg via INTRAVENOUS

## 2016-12-13 MED ORDER — HYDROCODONE-ACETAMINOPHEN 7.5-325 MG/15ML PO SOLN
10.0000 mL | ORAL | Status: DC | PRN
  Administered 2016-12-13 – 2016-12-14 (×6): 15 mL via ORAL
  Filled 2016-12-13 (×7): qty 15

## 2016-12-13 MED ORDER — SUCCINYLCHOLINE CHLORIDE 20 MG/ML IJ SOLN
INTRAMUSCULAR | Status: DC | PRN
  Administered 2016-12-13: 100 mg via INTRAVENOUS

## 2016-12-13 MED ORDER — LIDOCAINE-EPINEPHRINE 1 %-1:100000 IJ SOLN
INTRAMUSCULAR | Status: AC
  Filled 2016-12-13: qty 1

## 2016-12-13 SURGICAL SUPPLY — 43 items
ADH LQ OCL WTPRF AMP STRL LF (MISCELLANEOUS) ×1
ADH SKN CLS APL DERMABOND .7 (GAUZE/BANDAGES/DRESSINGS)
ADHESIVE MASTISOL STRL (MISCELLANEOUS) ×3 IMPLANT
BLADE SURG 15 STRL LF DISP TIS (BLADE) ×1 IMPLANT
BLADE SURG 15 STRL SS (BLADE) ×3
CANISTER SUCT 1200ML W/VALVE (MISCELLANEOUS) ×3 IMPLANT
CLOSURE WOUND 1/4X4 (GAUZE/BANDAGES/DRESSINGS) ×1
CORD BIP STRL DISP 12FT (MISCELLANEOUS) ×3 IMPLANT
COTTON BALL STRL MEDIUM (GAUZE/BANDAGES/DRESSINGS) ×3 IMPLANT
DERMABOND ADVANCED (GAUZE/BANDAGES/DRESSINGS)
DERMABOND ADVANCED .7 DNX12 (GAUZE/BANDAGES/DRESSINGS) IMPLANT
DRAIN TLS ROUND 10FR (DRAIN) ×3 IMPLANT
DRAPE MAG INST 16X20 L/F (DRAPES) ×3 IMPLANT
DRAPE SURG 17X11 SM STRL (DRAPES) ×3 IMPLANT
DRSG TEGADERM 2-3/8X2-3/4 SM (GAUZE/BANDAGES/DRESSINGS) ×9 IMPLANT
ELECT NEEDLE 20X.3 GREEN (MISCELLANEOUS) ×3
ELECT REM PT RETURN 9FT ADLT (ELECTROSURGICAL) ×3
ELECTRODE NEEDLE 20X.3 GREEN (MISCELLANEOUS) ×1 IMPLANT
ELECTRODE REM PT RTRN 9FT ADLT (ELECTROSURGICAL) ×1 IMPLANT
FORCEPS JEWEL BIP 4-3/4 STR (INSTRUMENTS) ×3 IMPLANT
GLOVE BIO SURGEON STRL SZ7.5 (GLOVE) ×6 IMPLANT
GOWN STRL REUS W/ TWL LRG LVL3 (GOWN DISPOSABLE) ×3 IMPLANT
GOWN STRL REUS W/TWL LRG LVL3 (GOWN DISPOSABLE) ×9
HEMOSTAT SURGICEL 2X3 (HEMOSTASIS) ×3 IMPLANT
HOOK STAY BLUNT/RETRACTOR 5M (MISCELLANEOUS) ×3 IMPLANT
JACKSON PRATT 7MM (INSTRUMENTS) IMPLANT
KIT RM TURNOVER STRD PROC AR (KITS) ×3 IMPLANT
LABEL OR SOLS (LABEL) IMPLANT
MARKER SKIN DUAL TIP RULER LAB (MISCELLANEOUS) ×3 IMPLANT
NS IRRIG 500ML POUR BTL (IV SOLUTION) ×3 IMPLANT
PACK HEAD/NECK (MISCELLANEOUS) ×3 IMPLANT
PROBE MONO 100X0.75 ELECT 1.9M (MISCELLANEOUS) ×3 IMPLANT
SHEARS HARMONIC 9CM CVD (BLADE) ×3 IMPLANT
SPONGE KITTNER 5P (MISCELLANEOUS) ×6 IMPLANT
SPONGE XRAY 4X4 16PLY STRL (MISCELLANEOUS) ×3 IMPLANT
STRIP CLOSURE SKIN 1/4X4 (GAUZE/BANDAGES/DRESSINGS) ×2 IMPLANT
SUT PROLENE 5 0 PS 3 (SUTURE) ×3 IMPLANT
SUT SILK 0 (SUTURE)
SUT SILK 0 30XBRD TIE 6 (SUTURE) IMPLANT
SUT SILK 2 0 (SUTURE) ×3
SUT SILK 2-0 30XBRD TIE 12 (SUTURE) ×1 IMPLANT
SUT VIC AB 4-0 RB1 18 (SUTURE) ×3 IMPLANT
SYSTEM CHEST DRAIN TLS 7FR (DRAIN) IMPLANT

## 2016-12-13 NOTE — Anesthesia Preprocedure Evaluation (Signed)
Anesthesia Evaluation  Patient identified by MRN, date of birth, ID band Patient awake    Reviewed: Allergy & Precautions, H&P , NPO status , Patient's Chart, lab work & pertinent test results, reviewed documented beta blocker date and time   Airway Mallampati: II  TM Distance: >3 FB Neck ROM: full    Dental  (+) Teeth Intact   Pulmonary neg pulmonary ROS,    Pulmonary exam normal        Cardiovascular + Peripheral Vascular Disease  negative cardio ROS Normal cardiovascular exam Rhythm:regular Rate:Normal     Neuro/Psych PSYCHIATRIC DISORDERS  Neuromuscular disease negative neurological ROS  negative psych ROS   GI/Hepatic negative GI ROS, Neg liver ROS, GERD  ,  Endo/Other  negative endocrine ROS  Renal/GU negative Renal ROS  negative genitourinary   Musculoskeletal   Abdominal   Peds  Hematology negative hematology ROS (+)   Anesthesia Other Findings Past Medical History: 08/06/2014: Acid reflux 08/06/2014: Anxiety No date: Arthritis 08/06/2014: Blood in feces 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 No date: Cancer ([place])     Comment:  forehead 08/06/2014: Decreased libido 08/06/2014: Dizziness and giddiness 08/06/2014: ED (erectile dysfunction) of organic origin No date: GERD (gastroesophageal reflux disease) 08/06/2014: H/O arthrodesis 08/06/2014: Leg varices Past Surgical History: 1993: CARDIAC CATHETERIZATION 2009: CERVICAL SPINE SURGERY     Comment:  radiculopathy from past fusion and arthritic changes 2013: COLONOSCOPY No date: ESOPHAGOGASTRODUODENOSCOPY ENDOSCOPY 2005: LAMINECTOMY BMI    Body Mass Index:  28.59 kg/m     Reproductive/Obstetrics negative OB ROS                             Anesthesia Physical Anesthesia Plan  ASA: III  Anesthesia Plan: General ETT   Post-op Pain Management:    Induction:   PONV Risk Score and Plan:   Airway Management Planned:   Additional  Equipment:   Intra-op Plan:   Post-operative Plan:   Informed Consent: I have reviewed the patients History and Physical, chart, labs and discussed the procedure including the risks, benefits and alternatives for the proposed anesthesia with the patient or authorized representative who has indicated his/her understanding and acceptance.   Dental Advisory Given  Plan Discussed with: CRNA  Anesthesia Plan Comments:         Anesthesia Quick Evaluation

## 2016-12-13 NOTE — Progress Notes (Signed)
Pt expressed to RN that he still feels nauseated after receiving zofran IV Q6 PRN at 1717. On call ENT was notified, new orders to place phenergan 25 mg PO Q6PRN and can discontinue zofran Q6 PRN.   Brookley Spitler [employer]

## 2016-12-13 NOTE — H&P (Signed)
..  History and Physical paper copy reviewed and updated date of procedure and will be scanned into system.  Patient seen and examined and marked.  

## 2016-12-13 NOTE — Op Note (Signed)
..12/13/2016  9:35 AM    Allie Bossier  [id number]   Pre-Op Dx: left parotid mass  Post-op Dx: SAME  Proc: Left superficial parotidectomy with facial nerve dissection and preservation  Surg: Ramah Langhans  Assistant: Anda Latina  Anes: GOT  EBL: <10ccs  Comp: None  Indications: Left parotid mass with indeterminate pathology on biopsy  Findings:Left superficial parotid mass just anterior and inferior to external auditory canal.  Left facial nerve trunk identified and dissected with preservation of all branches.  Good stimulation at completion of case.  Description of Procedure: After the patient was identified in holding and the history and physical and consent was reviewed and updated. The patient was marked in an upright position on the left side along a preauricular crease extending post-auricularly and then down into the neck along a natural occuring skin crease. The patient was next taken to the operating room and placed in a supine position. General endotracheal anesthesia was induced in the normal fashion. The patient's marked neck crease was neck injected with 9cc's of 1% lidocaine with 1:100,000 Epinephrine.  The facial nerve electrodes with Neurosign were set up in the normal fashion with good stimulation of marginal mandibular and orbicularis occuli muscles. The patient was next prepped and draped in a sterile normal fashion.  At this time, a 15 blade scalpel was used to make a skin incision along a previously marked preauricular and neck crease. Dissection was carefully performed through the subcutaneous tissues with combination of Bovie electrocautery and blunt dissection.  The platysma was incised inferior and the parotid capsule identified.  Superficial elevation of the subcutaneous tissue was made with sharp scissors and Bovie cautery for anterior flap over parotid capsule.  The anterior border of the sternocleidomastoid was identified and the tail  of parotid dissected away.  The greater auricular nerve was sacrificed at this time.  The anterior and inferior border of the external auditory canal was identified and cartilage followed until the tympano-mastoid suture line encountered.  This demonstrated a mass just anterior and inferior to the external canal.  The main trunk of the facial nerve was next identified and stimulated robustly.  This was dissected anteriorly until the pez was identified with superior and inferior branches.  These branches stimulated robustly.    With the nerve in view at all times, the superior attachments of the mass/parotid were dissected free from the remaining parotid gland.  Care was taken to avoid trauma to the capsule of the mass and to ensure complete excision.  The dissection was taken anteriorly again with the nerve branches in view at all times with Harmonic scalpel.  Once the anterior and superior sides of the mass were dissected away from the facial nerve the remaining attachments inferiorly were removed.  The mass was then passed off the table for permanent pathological evaluation.  The wound was copiously irrigated with sterile saline at this time.  Meticulous hemostasis was achieved with Bipolar.  The superior and inferior branches of the facial nerve were stimulated and moved all facial muscles robustly.  Surgicele was placed in the wound bed.  A 10 TLS drain was placed posteriorly.  The remaining parotid tissue was sutured to the facia on the external auditory canal.  The platysma and subcutaneous tissues were closed with interupted Vicryl suture.  The skin was closed with 5.0 prolene in a running fashion.  The wound was dressed with Bacitracin ointment.  At this time the patient was extubated and taken to PACU in good condition.  Plan: Follow pathology.  Limit activity for 2 weeks. Follow up next week for post-operative evaluation and suture removal.  Drain care.  Celestia Duva  12/13/2016 9:35  AM

## 2016-12-13 NOTE — Transfer of Care (Signed)
Immediate Anesthesia Transfer of Care Note  Patient: Benjamin Gutierrez  Procedure(s) Performed: TOTAL PAROTIDECTOMY WITH FACIAL NERVE MONITORING (Left )  Patient Location: PACU  Anesthesia Type:General  Level of Consciousness: awake and alert   Airway & Oxygen Therapy: Patient Spontanous Breathing and Patient connected to face mask oxygen  Post-op Assessment: Report given to RN and Post -op Vital signs reviewed and stable  Post vital signs: Reviewed and stable  Last Vitals:  Vitals:   12/13/16 0612 12/13/16 0949  BP: 130/87   Pulse: 76   Resp: 16   Temp: 36.9 C (!) (P) 36.3 C  SpO2: 97% (P) 96%    Last Pain:  Vitals:   12/13/16 0612  TempSrc: Oral         Complications: No apparent anesthesia complications

## 2016-12-13 NOTE — Progress Notes (Signed)
Pt adm to room 223 via stretcher from the OR.  No complaints of pain or discomfort at present.  Wife and daughter in the room with the pt.  Let neck noted with dried blood on the dressing.  Pt encouraged to use the urinal and to call the nurse when he has successfully done so.  HOB elevated per orders.  Pt alert and oriented x4, no complaints of pain or discomfort.  Bed in low position, call bell within reach.  Bed alarms on and functioning.  Assessment done and charted.  Will continue to monitor and do hourly rounding throughout the shift

## 2016-12-13 NOTE — Anesthesia Post-op Follow-up Note (Signed)
Anesthesia QCDR form completed.        

## 2016-12-13 NOTE — Anesthesia Procedure Notes (Signed)
Procedure Name: Intubation Performed by: Demetrius Charity Pre-anesthesia Checklist: Patient identified, Patient being monitored, Timeout performed, Emergency Drugs available and Suction available Patient Re-evaluated:Patient Re-evaluated prior to induction Oxygen Delivery Method: Circle system utilized Preoxygenation: Pre-oxygenation with 100% oxygen Induction Type: IV induction Ventilation: Mask ventilation without difficulty and Oral airway inserted - appropriate to patient size Laryngoscope Size: 3 and McGraph Grade View: Grade I Tube type: Oral Tube size: 7.0 mm Number of attempts: 1 Airway Equipment and Method: Stylet Placement Confirmation: ETT inserted through vocal cords under direct vision,  positive ETCO2 and breath sounds checked- equal and bilateral Secured at: 23 cm Tube secured with: Tape Dental Injury: Teeth and Oropharynx as per pre-operative assessment

## 2016-12-14 ENCOUNTER — Encounter: Payer: Self-pay | Admitting: Otolaryngology

## 2016-12-14 ENCOUNTER — Ambulatory Visit: Payer: PPO

## 2016-12-14 ENCOUNTER — Observation Stay (HOSPITAL_BASED_OUTPATIENT_CLINIC_OR_DEPARTMENT_OTHER)
Admission: RE | Admit: 2016-12-14 | Discharge: 2016-12-14 | Disposition: A | Discharge status: Home / Self Care | Payer: PPO | Source: Ambulatory Visit | Attending: Nurse Practitioner | Admitting: Nurse Practitioner

## 2016-12-14 ENCOUNTER — Observation Stay: Payer: PPO

## 2016-12-14 DIAGNOSIS — D110 Benign neoplasm of parotid gland: Secondary | ICD-10-CM | POA: Diagnosis not present

## 2016-12-14 DIAGNOSIS — I5030 Unspecified diastolic (congestive) heart failure: Secondary | ICD-10-CM | POA: Diagnosis not present

## 2016-12-14 DIAGNOSIS — I6522 Occlusion and stenosis of left carotid artery: Secondary | ICD-10-CM | POA: Diagnosis not present

## 2016-12-14 DIAGNOSIS — J9601 Acute respiratory failure with hypoxia: Secondary | ICD-10-CM | POA: Diagnosis not present

## 2016-12-14 DIAGNOSIS — R55 Syncope and collapse: Secondary | ICD-10-CM | POA: Diagnosis not present

## 2016-12-14 DIAGNOSIS — Z9889 Other specified postprocedural states: Secondary | ICD-10-CM | POA: Diagnosis not present

## 2016-12-14 LAB — CBC WITH DIFFERENTIAL/PLATELET
Basophils Absolute: 0 10*3/uL (ref 0–0.1)
Basophils Relative: 0 %
EOS PCT: 0 %
Eosinophils Absolute: 0 10*3/uL (ref 0–0.7)
HCT: 41.6 % (ref 40.0–52.0)
Hemoglobin: 14.1 g/dL (ref 13.0–18.0)
LYMPHS ABS: 1.2 10*3/uL (ref 1.0–3.6)
LYMPHS PCT: 10 %
MCH: 30.4 pg (ref 26.0–34.0)
MCHC: 33.8 g/dL (ref 32.0–36.0)
MCV: 89.9 fL (ref 80.0–100.0)
MONOS PCT: 8 %
Monocytes Absolute: 0.9 10*3/uL (ref 0.2–1.0)
Neutro Abs: 10.1 10*3/uL — ABNORMAL HIGH (ref 1.4–6.5)
Neutrophils Relative %: 82 %
PLATELETS: 167 10*3/uL (ref 150–440)
RBC: 4.63 MIL/uL (ref 4.40–5.90)
RDW: 13.5 % (ref 11.5–14.5)
WBC: 12.3 10*3/uL — AB (ref 3.8–10.6)

## 2016-12-14 LAB — COMPREHENSIVE METABOLIC PANEL
ALK PHOS: 43 U/L (ref 38–126)
ALT: 22 U/L (ref 17–63)
ANION GAP: 9 (ref 5–15)
AST: 26 U/L (ref 15–41)
Albumin: 3.6 g/dL (ref 3.5–5.0)
BUN: 13 mg/dL (ref 6–20)
CALCIUM: 9.2 mg/dL (ref 8.9–10.3)
CHLORIDE: 101 mmol/L (ref 101–111)
CO2: 28 mmol/L (ref 22–32)
Creatinine, Ser: 0.95 mg/dL (ref 0.61–1.24)
Glucose, Bld: 115 mg/dL — ABNORMAL HIGH (ref 65–99)
Potassium: 4.2 mmol/L (ref 3.5–5.1)
Sodium: 138 mmol/L (ref 135–145)
Total Bilirubin: 1 mg/dL (ref 0.3–1.2)
Total Protein: 6.8 g/dL (ref 6.5–8.1)

## 2016-12-14 LAB — ECHOCARDIOGRAM COMPLETE
Height: 68 in
Weight: 3008 oz

## 2016-12-14 LAB — SURGICAL PATHOLOGY

## 2016-12-14 MED ORDER — HYDROCODONE-ACETAMINOPHEN 7.5-325 MG/15ML PO SOLN
10.0000 mL | Freq: Four times a day (QID) | ORAL | Status: DC | PRN
  Administered 2016-12-15: 10 mL via ORAL
  Filled 2016-12-14: qty 15

## 2016-12-14 NOTE — Consult Note (Signed)
Cardiology Consult    Patient ID: Benjamin Gutierrez MRN: [id number], DOB/AGE: [birth year]   Admit date: 12/13/2016 Date of Consult: 12/14/2016  Primary Physician: Margo Common, PA Primary Cardiologist: Jerilynn Mages. Fletcher Anon, MD  Requesting Provider: Carmin Richmond, MD  Patient Profile    Benjamin Gutierrez is a 66 y.o. male with a history of GERD and parotid nodule who is being seen today for the evaluation of syncope at the request of Dr. Pryor Ochoa.  Past Medical History   Past Medical History:  Diagnosis Date  . Acid reflux 08/06/2014  . Anxiety 08/06/2014  . Arthritis   . Blood in feces 08/06/2014  . 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 Decreased libido 08/06/2014  . Dizziness and giddiness 08/06/2014  . ED (erectile dysfunction) of organic origin 08/06/2014  . GERD (gastroesophageal reflux disease)   . H/O arthrodesis 08/06/2014  . Leg varices 08/06/2014    Past Surgical History:  Procedure Laterality Date  . CARDIAC CATHETERIZATION  1993  . CERVICAL SPINE SURGERY  2009   radiculopathy from past fusion and arthritic changes  . COLONOSCOPY  2013  . ESOPHAGOGASTRODUODENOSCOPY ENDOSCOPY    . LAMINECTOMY  2005  . PAROTIDECTOMY Left 12/13/2016   Procedure: TOTAL PAROTIDECTOMY WITH FACIAL NERVE MONITORING;  Surgeon: Carloyn Manner, MD;  Location: [facility] ORS;  Service: ENT;  Laterality: Left;     Allergies  Allergies  Allergen Reactions  . Betadine [Povidone Iodine] Itching  . Fentanyl Other (See Comments)    Pain patch and after passed out and admitted to the hospital  . Penicillins Rash    History of Present Illness    66 y/o ? without prior cardiac history.  He has a h/o GERD and parotid nodule.  He was recently seen in cardiology clinic on 10/23 for preoperative clearance.  In the setting of good exercise tolerance and no symptoms of chest pain or dyspnea, he was felt to be low risk for cardiac complications.  He presented 10/25 for left parotidectomy.   Post-op, he had some nausea and dry heaving, along with facial pain.  He  Has been receiving hydrocodone liquid every 4 hrs and has also required zofran and phenergan (last dose 21:37).  This AM, @ ~ 8:30, while a drain was being manipulated, he felt lightheaded and was witnessed to have briefly lost consciousness.  Per his wife, he stopped breathing momentarily during that episode.  Later, while eating, it occurred again.  A HR of 47 was recorded @ 0920, with a BP of 117/63 @ that time.  Due to these two episodes, we were consulted.  He was not on tele for either episode however f/u ECG is non-acute.  He reports a prior h/o what sounds like vasovagal syncope following surgery in the past - occurring with each of two prior neck surgeries.  On both occasions, it sounds that he was very sensitive to pain meds.  Currently, he denies chest pain, palpitations, dyspnea, pnd, orthopnea, n, v, dizziness, syncope, edema, weight gain, or early satiety.   Inpatient Medications    . bacitracin  1 application Topical D5H  . chlorhexidine  15 mL Mouth/Throat Daily  . docusate sodium  100 mg Oral BID  . pantoprazole  40 mg Oral Daily    Family History    Family History  Problem Relation Age of Onset  . Esophageal cancer Mother   . Alcohol abuse Father   . Bone cancer Brother   .  Prostate cancer Brother   . Parkinson's disease Maternal Uncle   . Dementia Paternal Grandmother     Social History    Social History   Social History  . Marital status: Married    Spouse name: Bethena Roys  . Number of children: 2  . Years of education: 12   Occupational History  . Retired    Social History Main Topics  . Smoking status: Never Smoker  . Smokeless tobacco: Never Used  . Alcohol use 0.0 oz/week     Comment: rarely  . Drug use: No  . Sexual activity: Yes    Birth control/ protection: None   Other Topics Concern  . Not on file   Social History Narrative  . No narrative on file     Review of Systems      General:  No chills, fever, night sweats or weight changes.  Cardiovascular:  No chest pain, dyspnea on exertion, edema, orthopnea, palpitations, paroxysmal nocturnal dyspnea. +++ syncope/presyncope. Dermatological: No rash, lesions/masses Respiratory: No cough, dyspnea  Urologic: No hematuria, dysuria Abdominal:   No nausea, vomiting, diarrhea, bright red blood per rectum, melena, or hematemesis Neurologic:  No visual changes, wkns, changes in mental status. All other systems reviewed and are otherwise negative except as noted above.  Physical Exam    Blood pressure 117/63, pulse (!) 47, temperature 98 F (36.7 C), temperature source Oral, resp. rate 19, height 5\' 8"  (1.727 m), weight 188 lb (85.3 kg), SpO2 91 %.  General: Pleasant, NAD Psych: Normal affect. Neuro: Alert and oriented X 3. Moves all extremities spontaneously. HEENT: Normal  Neck: Supple without bruits or JVD. Lungs:  Resp regular and unlabored, CTA. Heart: RRR no s3, s4, or murmurs. Abdomen: Soft, non-tender, non-distended, BS + x 4.  Extremities: No clubbing, cyanosis or edema. DP/PT/Radials 2+ and equal bilaterally.  Labs     Lab Results  Component Value Date   WBC 5.8 11/23/2016   HGB 15.3 11/23/2016   HCT 44.4 11/23/2016   MCV 87.5 11/23/2016   PLT 175 11/23/2016    Recent Labs Lab 12/07/16 1138  NA 141  K 4.8  CL 103  CO2 28  BUN 16  CREATININE 0.99  CALCIUM 9.7  GLUCOSE 91    Radiology Studies    Dg Chest 1 View  Result Date: 12/13/2016 CLINICAL DATA:  Low oxygen saturation.  Status post parotidectomy. EXAM: CHEST 1 VIEW COMPARISON:  01/20/2010 FINDINGS: Multiple external monitoring leads overlie the chest. The patient is mildly rotated to the right. The cardiac silhouette is partially obscured but grossly similar in size to the prior study. Accentuation of the superior mediastinal soft tissues may be related to portable AP technique and low lung volumes, similar to an older portable AP  chest radiograph from 05/13/2007. There is chronic elevation of the left hemidiaphragm. Lung volumes are low with bibasilar opacities. No large pleural effusion or pneumothorax is identified. Prior cervical spine fusion is noted. IMPRESSION: Low lung volumes with bibasilar atelectasis. Electronically Signed   By: Logan Bores M.D.   On: 12/13/2016 12:07   Ct Soft Tissue Neck W Contrast  Result Date: 11/15/2016 CLINICAL DATA:  Odynophagia EXAM: CT NECK WITH CONTRAST TECHNIQUE: Multidetector CT imaging of the neck was performed using the standard protocol following the bolus administration of intravenous contrast. CONTRAST:  72mL ISOVUE-300 IOPAMIDOL (ISOVUE-300) INJECTION 61% COMPARISON:  None. FINDINGS: Pharynx and larynx: Normal. No mass or swelling. Salivary glands: 17 mm enhancing soft tissue nodule just below the external  auditory canal on the left. This could be a parotid lesion but could also be an extra parotid lymph node. This has irregular enhancement and biopsy is suggested Remainder of the left parotid gland normal. Right parotid normal. Submandibular gland fatty replaced and without mass lesion Thyroid: Negative Lymph nodes: Aside from the nodule below the left external auditory canal, no worrisome lymph nodes in the neck Vascular: Negative Limited intracranial: Negative Visualized orbits: Not imaged Mastoids and visualized paranasal sinuses: Negative Skeleton: ACDF C3-4 and C4-5. If pseudarthrosis at C4-5. Solid fusion C5-6 and C6-7. Disc degeneration and expands extensive spurring at C6-7. No acute skeletal lesion. Upper chest: Lung apices clear.  No mediastinal mass Other: None IMPRESSION: 17 mm soft tissue nodule below the left external auditory canal. Possible parotid tumor versus extra parotid lymph node. Based on size and enhancement pattern, biopsy suggested. Mucosal evaluation suggested to evaluate for pharyngeal neoplasm. Cervical fusion C3 through C7 with pseudarthrosis at C4-5  Electronically Signed   By: Franchot Gallo M.D.   On: 11/15/2016 09:35   [place] Core Biopsy (lymph Nodes)  Result Date: 11/23/2016 CLINICAL DATA:  17 mm periauricular soft tissue nodule by CT representing either lymph node or salivary origin mass. The patient presents for biopsy. EXAM: ULTRASOUND GUIDED FINE NEEDLE ASPIRATE AND CORE BIOPSY OF LEFT PERIAURICULAR LYMPH NODE MEDICATIONS: 1.0 mg IV Versed Total Moderate Sedation Time: 20 minutes. The patient's level of consciousness and physiologic status were continuously monitored during the procedure by Radiology nursing. PROCEDURE: The procedure, risks, benefits, and alternatives were explained to the patient. Questions regarding the procedure were encouraged and answered. The patient understands and consents to the procedure. A time out was performed prior to initiating the procedure. Ultrasound was performed to localize a soft tissue mass in the left periauricular region. The left face was prepped with chlorhexidine in a sterile fashion, and a sterile drape was applied covering the operative field. A sterile gown and sterile gloves were used for the procedure. Local anesthesia was provided with 1% Lidocaine. Fine-needle aspiration was performed with 25 gauge needles. Three needle aspirate samples were obtained and submitted for cytologic analysis. Core biopsy was then performed with an 18 gauge core biopsy device. A total of 3 separate core biopsy samples were obtained and submitted on saline soaked Telfa. COMPLICATIONS: None. FINDINGS: Hypoechoic solid mass measures approximately 1.5 cm in greatest diameter by ultrasound and is located inferior and slightly posterior to the auricle on the left. This does not appear to be of parotid origin and is favored to represent a lymph node. This does not have normal lymph node architecture. Initial analysis of cytologic specimens demonstrated lymphocytes. Core biopsy was performed to facilitate histologic analysis. Solid  core biopsy samples were obtained. IMPRESSION: Ultrasound-guided needle aspirate and core biopsy of left 1.5 cm periauricular soft tissue mass which is favored to represent a lymph node by ultrasound appearance. Initial quick analysis of fine-needle aspirate samples obtained today also demonstrated lymphocytes. Electronically Signed   By: Aletta Edouard M.D.   On: 11/23/2016 15:52    ECG & Cardiac Imaging    Sinus arrhythmia, 72, LVH, no acute st/t changes.  Assessment & Plan    1.  Presyncope/Syncope:  Pt admitted 10/25 for L parotidectomy and post-operatively has been experiencing pain and nausea.  Received phenergan late last night and has been receiving hydrocodone liquid q 4hrs.  This AM, he had two witnessed episodes of loss of consciousness - both brief. Pt reports prior, similar episodes following neck  surgeries in the past and reports sensitivity to pain meds.  By his description, episodes this AM preceded by drowsiness and sudden onset of lightheadedness.  Per wife, he was briefly unresponsive and apneic.  HR recorded @ 47 @ 9:20.  Not on tele currently.  ECG w/o acute changes.  Pt and wife concerned that this will recur if he goes home on pain meds.  Suspect vasovagal syncope.  Will place on tele and check echo.  Would try to move to non-narcotic analgesics as soon as feasible/tolerable.  Signed, Murray Hodgkins, NP 12/14/2016, 10:42 AM  For questions or updates, please contact   Please consult [email or web address] for contact info under Cardiology/STEMI.

## 2016-12-14 NOTE — Consult Note (Addendum)
Medical Consultation  Benjamin Gutierrez QIH:[id number] DOB: 1950/11/05 DOA: 12/13/2016 PCP: Margo Common, PA   Requesting physician: dr Pryor Ochoa Date of consultation: 12/14/2016 Reason for consultation: syncope  Impression/Recommendations  66 year old male with history of GERD postoperative day one S/p left superficial parotidectomy who is had several episodes of syncope after procedure.  1. Syncope: Etiology unclear at this time, however family reporting that this occurs after surgeries and may be vasovagal in nature Order telemetry Order echocardiogram Order carotid Doppler Cardiology consultation with [facility] requested Check orthostatic VS  2.S/p left superficial parotidectomy: Management as per ENT  3. Acute hypoxic respiratory failure due to atelectasis Continue ISS  Chief Complaint: Passing out  HPI:  66 year old male with a history of GERD who is postoperative day #1 S/p left superficial parotidectomy. Patient had 3 episodes of syncope this morning as Dr. Pryor Ochoa was adjusting his drain. Family reports that he has had this before postoperative day #1 after anesthesia. Prior to his symptoms he does feel lightheaded. He denies chest pain. He passes out for seconds and the family sees his eyes rolled back. There is no seizure-like activity noted. He comes to very quickly. There was concern because this happened 3 times this morning all been in a span of 30 minutes.    Review of Systems  Constitutional: Negative for fever, chills weight loss HENT: Negative for ear pain, nosebleeds, congestion, facial swelling, rhinorrhea, neck pain, neck stiffness and ear discharge.   He has a drain placed after removal of parotid gland Respiratory: Negative for cough, shortness of breath, wheezing  Cardiovascular: Negative for chest pain, palpitations and leg swelling. Positive syncope Gastrointestinal: Negative for abdominal pain, vomiting, diarrhea or consitpation Positive history of  heartburn Genitourinary: Negative for dysuria, urgency, frequency, hematuria Musculoskeletal: Negative for back pain or joint pain Neurological: Negative for dizziness, seizures, syncope, focal weakness,  numbness and headaches.  Hematological: Does not bruise/bleed easily.  Psychiatric/Behavioral: Negative for hallucinations, confusion, dysphoric mood   Past Medical History:  Diagnosis Date  . Acid reflux 08/06/2014  . Anxiety 08/06/2014  . Arthritis   . Blood in feces 08/06/2014  . 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 Decreased libido 08/06/2014  . Dizziness and giddiness 08/06/2014  . ED (erectile dysfunction) of organic origin 08/06/2014  . GERD (gastroesophageal reflux disease)   . H/O arthrodesis 08/06/2014  . Leg varices 08/06/2014   Past Surgical History:  Procedure Laterality Date  . CARDIAC CATHETERIZATION  1993  . CERVICAL SPINE SURGERY  2009   radiculopathy from past fusion and arthritic changes  . COLONOSCOPY  2013  . ESOPHAGOGASTRODUODENOSCOPY ENDOSCOPY    . LAMINECTOMY  2005  . PAROTIDECTOMY Left 12/13/2016   Procedure: TOTAL PAROTIDECTOMY WITH FACIAL NERVE MONITORING;  Surgeon: Carloyn Manner, MD;  Location: [facility] ORS;  Service: ENT;  Laterality: Left;   Social History:  reports that he has never smoked. He has never used smokeless tobacco. He reports that he drinks alcohol. He reports that he does not use drugs.  Allergies  Allergen Reactions  . Betadine [Povidone Iodine] Itching  . Fentanyl Other (See Comments)    Pain patch and after passed out and admitted to the hospital  . Penicillins Rash   Family History  Problem Relation Age of Onset  . Esophageal cancer Mother   . Alcohol abuse Father   . Bone cancer Brother   . Prostate cancer Brother   . Parkinson's disease Maternal Uncle   . Dementia Paternal  Grandmother     Prior to Admission medications   Medication Sig Start Date End Date Taking? Authorizing Provider   chlorhexidine (PERIDEX) 0.12 % solution Use as directed 15 mLs in the mouth or throat daily.   Yes [provider]  dexlansoprazole (DEXILANT) 60 MG capsule Take 60 mg by mouth daily.   Yes [provider]  ibuprofen (ADVIL,MOTRIN) 200 MG tablet Take 400 mg by mouth every 6 (six) hours as needed for headache or moderate pain.   Yes [provider]  naproxen sodium (ANAPROX) 220 MG tablet Take 220 mg by mouth 2 (two) times daily as needed (for back pain).   Yes [provider]  neomycin-bacitracin-polymyxin (NEOSPORIN) ointment Apply 1 application topically as needed for wound care.   Yes [provider]    Physical Exam: Blood pressure 117/63, pulse (!) 47, temperature 98 F (36.7 C), temperature source Oral, resp. rate 19, height 5\' 8"  (1.727 m), weight 85.3 kg (188 lb), SpO2 91 %. @VITALS2 @ [employer]   12/13/16 0612  Weight: 85.3 kg (188 lb)    Intake/Output Summary (Last 24 hours) at 12/14/16 1045 Last data filed at 12/14/16 0800  Gross per 24 hour  Intake           760.42 ml  Output              700 ml  Net            60.42 ml     Constitutional: Appears well-developed and well-nourished. No distress. HENT: Normocephalic. Marland Kitchen Oropharynx is clear and moist.  Eyes: Conjunctivae and EOM are normal. PERRLA, no scleral icterus.  Neck: Normal ROM. Neck supple. No JVD. No tracheal deviation. Drain placed CVS: RRR, S1/S2 +, no murmurs, no gallops, no carotid bruit.  Pulmonary: Effort and breath sounds normal, no stridor, rhonchi, wheezes, rales.  Abdominal: Soft. BS +,  no distension, tenderness, rebound or guarding.  Musculoskeletal: Normal range of motion. No edema and no tenderness.  Neuro: Alert. CN 2-12 grossly intact. No focal deficits. Skin: Skin is warm and dry. No rash noted. Psychiatric: Normal mood and affect.    Labs  Basic Metabolic Panel:  Recent Labs Lab 12/07/16 1138  NA 141  K 4.8  CL 103  CO2 28  GLUCOSE  91  BUN 16  CREATININE 0.99  CALCIUM 9.7   Liver Function Tests: No results for input(s): AST, ALT, ALKPHOS, BILITOT, PROT, ALBUMIN in the last 168 hours. No results for input(s): LIPASE, AMYLASE in the last 168 hours.  CBC: No results for input(s): WBC, NEUTROABS, HGB, HCT, MCV, PLT in the last 168 hours. Cardiac Enzymes: No results for input(s): CKTOTAL, CKMB, CKMBINDEX, TROPONINI in the last 168 hours. BNP: Invalid input(s): POCBNP CBG: No results for input(s): GLUCAP in the last 168 hours.  Radiological Exams: Dg Chest 1 View  Result Date: 12/13/2016 CLINICAL DATA:  Low oxygen saturation.  Status post parotidectomy. EXAM: CHEST 1 VIEW COMPARISON:  01/20/2010 FINDINGS: Multiple external monitoring leads overlie the chest. The patient is mildly rotated to the right. The cardiac silhouette is partially obscured but grossly similar in size to the prior study. Accentuation of the superior mediastinal soft tissues may be related to portable AP technique and low lung volumes, similar to an older portable AP chest radiograph from 05/13/2007. There is chronic elevation of the left hemidiaphragm. Lung volumes are low with bibasilar opacities. No large pleural effusion or pneumothorax is identified. Prior cervical spine fusion is noted. IMPRESSION: Low  lung volumes with bibasilar atelectasis. Electronically Signed   By: Logan Bores M.D.   On: 12/13/2016 12:07    EKG: NSR LVH mild No ST elevation/depression   Thank you for allowing me to participate in the care of your patient. We will continue to follow.   Note: This dictation was prepared with Dragon dictation along with smaller phrase technology. Any transcriptional errors that result from this process are unintentional.  Time spent: 41 minutes  Gust Eugene, MD

## 2016-12-14 NOTE — Progress Notes (Signed)
.. 12/14/2016 5:40 PM  Benjamin Gutierrez [id number]  Post-Op Day 1    Temp:  [97.9 F (36.6 C)-98 F (36.7 C)] 98 F (36.7 C) (10/26 0433) Pulse Rate:  [47-105] 95 (10/26 1452) Resp:  [18-19] 19 (10/26 0433) BP: (117-128)/(63-80) 118/80 (10/26 1452) SpO2:  [91 %-95 %] 93 % (10/26 1452),     Intake/Output Summary (Last 24 hours) at 12/14/16 1740 Last data filed at 12/14/16 1300  Gross per 24 hour  Intake              750 ml  Output              600 ml  Net              150 ml    Results for orders placed or performed during the hospital encounter of 12/13/16 (from the past 24 hour(s))  CBC with Differential/Platelet     Status: Abnormal   Collection Time: 12/14/16 10:30 AM  Result Value Ref Range   WBC 12.3 (H) 3.8 - 10.6 K/uL   RBC 4.63 4.40 - 5.90 MIL/uL   Hemoglobin 14.1 13.0 - 18.0 g/dL   HCT 41.6 40.0 - 52.0 %   MCV 89.9 80.0 - 100.0 fL   MCH 30.4 26.0 - 34.0 pg   MCHC 33.8 32.0 - 36.0 g/dL   RDW 13.5 11.5 - 14.5 %   Platelets 167 150 - 440 K/uL   Neutrophils Relative % 82 %   Neutro Abs 10.1 (H) 1.4 - 6.5 K/uL   Lymphocytes Relative 10 %   Lymphs Abs 1.2 1.0 - 3.6 K/uL   Monocytes Relative 8 %   Monocytes Absolute 0.9 0.2 - 1.0 K/uL   Eosinophils Relative 0 %   Eosinophils Absolute 0.0 0 - 0.7 K/uL   Basophils Relative 0 %   Basophils Absolute 0.0 0 - 0.1 K/uL  Comprehensive metabolic panel     Status: Abnormal   Collection Time: 12/14/16 10:30 AM  Result Value Ref Range   Sodium 138 135 - 145 mmol/L   Potassium 4.2 3.5 - 5.1 mmol/L   Chloride 101 101 - 111 mmol/L   CO2 28 22 - 32 mmol/L   Glucose, Bld 115 (H) 65 - 99 mg/dL   BUN 13 6 - 20 mg/dL   Creatinine, Ser 0.95 0.61 - 1.24 mg/dL   Calcium 9.2 8.9 - 10.3 mg/dL   Total Protein 6.8 6.5 - 8.1 g/dL   Albumin 3.6 3.5 - 5.0 g/dL   AST 26 15 - 41 U/L   ALT 22 17 - 63 U/L   Alkaline Phosphatase 43 38 - 126 U/L   Total Bilirubin 1.0 0.3 - 1.2 mg/dL   GFR calc non Af Amer >60 >60 mL/min   GFR calc  Af Amer >60 >60 mL/min   Anion gap 9 5 - 15    SUBJECTIVE:  Several syncopal episodes this a.m. Continuing after I left.  Medicine and Cardiology evaluated patient who both feel medication related.  Decreased frequency and amount of Hydrocodone.  Carotid dopplers performed.  OBJECTIVE:  GEN-  NAD, continued slight weakness to left eye lid NECK-  Incision c/d/i, drain removed.  Moderate edema on posterior flap but soft  IMPRESSION:  S/p left parotidectomy  PLAN:  Appreciate medicine and cardiology input.  Continue Bacitracin.  Significant improvement since earlier today with ambulating well and off Oxygen.  Will see how looks tomorrow.  Discussed benign pathology with patient and wife.  [name] 12/14/2016,  5:40 PM

## 2016-12-14 NOTE — Care Management Obs Status (Signed)
[facility] NOTIFICATION   Patient Details  Name: Benjamin Gutierrez MRN: [id number] Date of Birth: [birth year]   Medicare Observation Status Notification Given:  Yes    Beverly Sessions, RN 12/14/2016, 4:56 PM

## 2016-12-14 NOTE — Progress Notes (Signed)
Pt has had three syncopal episodes with a loss of consciousness for about thirty seconds each time. MD was at bedside during episodes. VS were checked and were normal throughout except for some mild bradycardia. Oxygen applied and pt has since regained consciousness and is alert and oriented. Surgeon called stat hospitalist consult who ordered EKG, telemetry and a carotid ultrasound. Spouse at bedside and pt is currently sitting up without issue, will continue to monitor.

## 2016-12-14 NOTE — Progress Notes (Signed)
..   12/14/2016 8:38 AM  Benjamin Gutierrez [id number]  Post-Op Day 1    Temp:  [97.3 F (36.3 C)-98.2 F (36.8 C)] 98 F (36.7 C) (10/26 0433) Pulse Rate:  [94-121] 94 (10/26 0433) Resp:  [13-26] 19 (10/26 0433) BP: (110-132)/(69-90) 117/75 (10/26 0433) SpO2:  [90 %-96 %] 91 % (10/26 0433),     Intake/Output Summary (Last 24 hours) at 12/14/16 0838 Last data filed at 12/14/16 0800  Gross per 24 hour  Intake          1460.42 ml  Output              720 ml  Net           740.42 ml    No results found for this or any previous visit (from the past 24 hour(s)).  SUBJECTIVE:  No acute events overnight.  Was nauseated but Phenergan helped.  Ambulating and voiding.  Reports left sided jaw pain.  OBJECTIVE:   GEN-  NAD, alert and oriented Neuro-  Slight weakness on superior branch of nerve but complete eye closure NECK-  Increased edema surrounding posterior flap.  Drain adjusted and slight amount removed.  IMPRESSION:  S/p left superficial parotidectomy  PLAN:  1)  PAtient had vasovagal episode during drain adjustment.  BP 114/74, HR 63, 02% 95%.  Will see how does later on today.  Anticipate discharge later today 2)  Left side facial weakness-  All nerves intact, most likely for stimulation of nerve during procedure.  Complete eye closure and anticipate resolution 3)  Seroma beneath posterior aspect of flap.  Will keep ice but anticipate resolution.  Consider addition of antibiotics. 4)  Hypoxia-  Will see how does with ambulation and oxygen saturation.  Most likely secondary to atelectasis as seen on CXR.  Incentive spirometer.  [name] 12/14/2016, 8:38 AM

## 2016-12-14 NOTE — Care Management Important Message (Deleted)
Important Message  Patient Details  Name: Benjamin Gutierrez MRN: [id number] Date of Birth: 1950-11-04   Medicare Important Message Given:  Yes    Beverly Sessions, RN 12/14/2016, 4:55 PM

## 2016-12-15 DIAGNOSIS — J9601 Acute respiratory failure with hypoxia: Secondary | ICD-10-CM | POA: Diagnosis not present

## 2016-12-15 DIAGNOSIS — R55 Syncope and collapse: Secondary | ICD-10-CM

## 2016-12-15 DIAGNOSIS — Z9889 Other specified postprocedural states: Secondary | ICD-10-CM | POA: Diagnosis not present

## 2016-12-15 DIAGNOSIS — D110 Benign neoplasm of parotid gland: Secondary | ICD-10-CM | POA: Diagnosis not present

## 2016-12-15 MED ORDER — BACITRACIN ZINC 500 UNIT/GM EX OINT: 1.0000 "application " | TOPICAL_OINTMENT | Freq: Three times a day (TID) | CUTANEOUS | 0 refills | Status: DC

## 2016-12-15 MED ORDER — DOCUSATE SODIUM 100 MG PO CAPS: 100.0000 mg | ORAL_CAPSULE | Freq: Two times a day (BID) | ORAL | 0 refills | Status: DC

## 2016-12-15 MED ORDER — HYDROCODONE-ACETAMINOPHEN 7.5-325 MG/15ML PO SOLN: 10.0000 mL | Freq: Four times a day (QID) | ORAL | 0 refills | Status: DC | PRN

## 2016-12-15 MED ORDER — PROMETHAZINE HCL 25 MG PO TABS: 25.0000 mg | ORAL_TABLET | Freq: Four times a day (QID) | ORAL | 0 refills | Status: DC | PRN

## 2016-12-15 NOTE — Progress Notes (Signed)
Progress Note  Patient Name: Benjamin Gutierrez Date of Encounter: 12/15/2016  Primary Cardiologist: New to [facility]  Subjective   Uneventful night, Denies any near-syncope, dizziness symptoms Preparing to go home Telemetry data has been erased, sensors taken off Discussion of his history he reports having prior episodes of vasovagal, sometimes having bowel movement other times having to blood draw   Inpatient Medications    Scheduled Meds: . bacitracin  1 application Topical W5I  . chlorhexidine  15 mL Mouth/Throat Daily  . docusate sodium  100 mg Oral BID  . pantoprazole  40 mg Oral Daily   Continuous Infusions:  PRN Meds: acetaminophen (TYLENOL) oral liquid 160 mg/5 mL **OR** acetaminophen, HYDROcodone-acetaminophen, promethazine   Vital Signs    Vitals:   12/14/16 0920 12/14/16 1452 12/14/16 2112 12/15/16 0606  BP: 117/63 118/80 (!) 148/89 111/79  Pulse: (!) 47 95 (!) 101 78  Resp:   20 18  Temp:   97.7 F (36.5 C) 97.9 F (36.6 C)  TempSrc:   Oral Oral  SpO2:  93% 93% 94%  Weight:      Height:        Intake/Output Summary (Last 24 hours) at 12/15/16 1225 Last data filed at 12/15/16 1046  Gross per 24 hour  Intake             1230 ml  Output              675 ml  Net              555 ml   Filed Weights   12/13/16 0612  Weight: 188 lb (85.3 kg)    Telemetry    Sensors have been taken off, data has been cleared Unable to review  ECG      Physical Exam   GEN: No acute distress.   Neck: No JVD, well-healing wound left side of his neck Cardiac: RRR, no murmurs, rubs, or gallops.  Respiratory: Clear to auscultation bilaterally. GI: Soft, nontender, non-distended  MS: No edema; No deformity. Neuro:  Nonfocal  Psych: Normal affect   Labs    Chemistry Recent Labs Lab 12/14/16 1030  NA 138  K 4.2  CL 101  CO2 28  GLUCOSE 115*  BUN 13  CREATININE 0.95  CALCIUM 9.2  PROT 6.8  ALBUMIN 3.6  AST 26  ALT 22  ALKPHOS 43  BILITOT  1.0  GFRNONAA >60  GFRAA >60  ANIONGAP 9     Hematology Recent Labs Lab 12/14/16 1030  WBC 12.3*  RBC 4.63  HGB 14.1  HCT 41.6  MCV 89.9  MCH 30.4  MCHC 33.8  RDW 13.5  PLT 167    Cardiac EnzymesNo results for input(s): TROPONINI in the last 168 hours. No results for input(s): TROPIPOC in the last 168 hours.   BNPNo results for input(s): BNP, PROBNP in the last 168 hours.   DDimer No results for input(s): DDIMER in the last 168 hours.   Radiology    US Carotid Bilateral  Result Date: 12/14/2016 CLINICAL DATA:  Syncope. EXAM: BILATERAL CAROTID DUPLEX ULTRASOUND TECHNIQUE: Pearline Cables scale imaging, color Doppler and duplex ultrasound were performed of bilateral carotid and vertebral arteries in the neck. COMPARISON:  None. FINDINGS: Criteria: Quantification of carotid stenosis is based on velocity parameters that correlate the residual internal carotid diameter with NASCET-based stenosis levels, using the diameter of the distal internal carotid lumen as the denominator for stenosis measurement. The following velocity measurements were obtained:  RIGHT ICA:  66/22 cm/sec CCA:  78/58 cm/sec SYSTOLIC ICA/CCA RATIO:  0.7 DIASTOLIC ICA/CCA RATIO:  0.9 ECA:  112 cm/sec LEFT ICA:  77/20 cm/sec CCA:  850/27 cm/sec SYSTOLIC ICA/CCA RATIO:  0.6 DIASTOLIC ICA/CCA RATIO:  0.7 ECA:  129 cm/sec RIGHT CAROTID ARTERY: No evidence of right-sided carotid plaque or stenosis. RIGHT VERTEBRAL ARTERY: Antegrade flow with normal waveform and velocity. LEFT CAROTID ARTERY: Minimal amount of focal calcified plaque is present at the level of the left ICA origin. No significant stenosis identified with estimated left ICA stenosis of less than 50%. LEFT VERTEBRAL ARTERY: Antegrade flow with normal waveform and velocity. IMPRESSION: Minimal plaque at left ICA origin with estimated left ICA stenosis of less than 50%. No evidence of right-sided carotid plaque or stenosis. Electronically Signed   By: Aletta Edouard M.D.    On: 12/14/2016 12:47    Cardiac Studies   Echo reviewed showing normal LV function no significant valve abnormality  Patient Profile     66 y.o. male   Assessment & Plan    A/P: 1)Presyncope/Syncope:  Concerning for vasovagal etiology admitted 10/25 for L parotidectomy and post-operatively has been experiencing pain and nausea.    two witnessed episodes of loss of consciousness - both brief.  similar episodes following neck surgeries in the past  episodes preceded by drowsiness and sudden onset of lightheadedness.  Per wife, he was briefly unresponsive and apneic. -No further episodes Recommended if he has warning of additional episodes that he lay flat/supine For recurrent episodes also recommended he call our office, we would perform a 30-day monitor Suggested he stay hydrated, not avoid salt    Total encounter time more than 25 minutes  Greater than 50% was spent in counseling and coordination of care with the patient    For questions or updates, please contact [facility] Please consult [email or web address] for contact info under Cardiology/STEMI.      Signed, Ida Rogue, MD  12/15/2016, 12:25 PM

## 2016-12-15 NOTE — Progress Notes (Signed)
SATURATION QUALIFICATIONS: (This note is used to comply with regulatory documentation for home oxygen)  Patient Saturations on Room Air at Rest = 91  Patient Saturations on Room Air while Ambulating = 94  Pt has not need for 02. Pt ambulated 360 feet on room air.

## 2016-12-15 NOTE — Progress Notes (Signed)
Benjamin Gutierrez  A and O x 4. VSS. Pt tolerating diet well. No complaints of pain or nausea. IV removed intact, prescriptions given. Pt voiced understanding of discharge instructions with no further questions. Pt discharged via wheelchair with nurse tech.    Allergies as of 12/15/2016      Reactions   Betadine [povidone Iodine] Itching   Fentanyl Other (See Comments)   Pain patch and after passed out and admitted to the hospital   Penicillins Rash      Medication List    STOP taking these medications   ibuprofen 200 MG tablet Commonly known as:  ADVIL,MOTRIN   naproxen sodium 220 MG tablet Commonly known as:  ANAPROX   neomycin-bacitracin-polymyxin ointment Commonly known as:  NEOSPORIN     TAKE these medications   bacitracin ointment Apply 1 application topically every 8 (eight) hours.   chlorhexidine 0.12 % solution Commonly known as:  PERIDEX Use as directed 15 mLs in the mouth or throat daily.   dexlansoprazole 60 MG capsule Commonly known as:  DEXILANT Take 60 mg by mouth daily.   docusate sodium 100 MG capsule Commonly known as:  COLACE Take 1 capsule (100 mg total) by mouth 2 (two) times daily.   HYDROcodone-acetaminophen 7.5-325 mg/15 ml solution Commonly known as:  HYCET Take 10 mLs by mouth every 6 (six) hours as needed for moderate pain.   promethazine 25 MG tablet Commonly known as:  PHENERGAN Take 1 tablet (25 mg total) by mouth every 6 (six) hours as needed for nausea or vomiting.       Vitals:   12/14/16 2112 12/15/16 0606  BP: (!) 148/89 111/79  Pulse: (!) 101 78  Resp: 20 18  Temp: 97.7 F (36.5 C) 97.9 F (36.6 C)  SpO2: 93% 94%    Benjamin Gutierrez

## 2016-12-15 NOTE — Progress Notes (Signed)
Patient ID: Benjamin Gutierrez, male   DOB: 11-06-1950, 66 y.o.   MRN: [id number]  [facility] PROGRESS NOTE  Benjamin Gutierrez SWN:[id number] DOB: June 07, 1950 DOA: 12/13/2016 PCP: Margo Common, PA  HPI/Subjective:   Objective: Vitals:   12/14/16 2112 12/15/16 0606  BP: (!) 148/89 111/79  Pulse: (!) 101 78  Resp: 20 18  Temp: 97.7 F (36.5 C) 97.9 F (36.6 C)  SpO2: 93% 94%    Filed Weights   12/13/16 0612  Weight: 85.3 kg (188 lb)    ROS: Review of Systems  Constitutional: Negative for chills and fever.  Eyes: Negative for blurred vision.  Respiratory: Negative for cough and shortness of breath.   Cardiovascular: Negative for chest pain.  Gastrointestinal: Negative for abdominal pain, constipation, diarrhea, nausea and vomiting.  Genitourinary: Negative for dysuria.  Musculoskeletal: Negative for joint pain.  Neurological: Negative for dizziness and headaches.   Exam: Physical Exam  Constitutional: He is oriented to person, place, and time.  HENT:  Nose: No mucosal edema.  Mouth/Throat: No oropharyngeal exudate or posterior oropharyngeal edema.  Eyes: Pupils are equal, round, and reactive to light. Conjunctivae, EOM and lids are normal.  Neck: No JVD present. Carotid bruit is not present. No edema present. No thyroid mass and no thyromegaly present.  Cardiovascular: S1 normal and S2 normal.  Exam reveals no gallop.   No murmur heard. Pulses:      Dorsalis pedis pulses are 2+ on the right side, and 2+ on the left side.  Respiratory: No respiratory distress. He has no wheezes. He has no rhonchi. He has no rales.  GI: Soft. Bowel sounds are normal. There is no tenderness.  Musculoskeletal:       Right ankle: He exhibits no swelling.       Left ankle: He exhibits no swelling.  Lymphadenopathy:    He has no cervical adenopathy.  Neurological: He is alert and oriented to person, place, and time. No cranial nerve deficit.  Skin: Skin is warm. Nails show no  clubbing.  Some swelling left face behind the ear and then front of the ear.  Psychiatric: He has a normal mood and affect.      Data Reviewed: Basic Metabolic Panel:  Recent Labs Lab 12/14/16 1030  NA 138  K 4.2  CL 101  CO2 28  GLUCOSE 115*  BUN 13  CREATININE 0.95  CALCIUM 9.2   Liver Function Tests:  Recent Labs Lab 12/14/16 1030  AST 26  ALT 22  ALKPHOS 43  BILITOT 1.0  PROT 6.8  ALBUMIN 3.6   CBC:  Recent Labs Lab 12/14/16 1030  WBC 12.3*  NEUTROABS 10.1*  HGB 14.1  HCT 41.6  MCV 89.9  PLT 167     Studies: Dg Chest 1 View  Result Date: 12/13/2016 CLINICAL DATA:  Low oxygen saturation.  Status post parotidectomy. EXAM: CHEST 1 VIEW COMPARISON:  01/20/2010 FINDINGS: Multiple external monitoring leads overlie the chest. The patient is mildly rotated to the right. The cardiac silhouette is partially obscured but grossly similar in size to the prior study. Accentuation of the superior mediastinal soft tissues may be related to portable AP technique and low lung volumes, similar to an older portable AP chest radiograph from 05/13/2007. There is chronic elevation of the left hemidiaphragm. Lung volumes are low with bibasilar opacities. No large pleural effusion or pneumothorax is identified. Prior cervical spine fusion is noted. IMPRESSION: Low lung volumes with bibasilar atelectasis. Electronically Signed   By: Zenia Resides  Jeralyn Ruths M.D.   On: 12/13/2016 12:07   US Carotid Bilateral  Result Date: 12/14/2016 CLINICAL DATA:  Syncope. EXAM: BILATERAL CAROTID DUPLEX ULTRASOUND TECHNIQUE: Pearline Cables scale imaging, color Doppler and duplex ultrasound were performed of bilateral carotid and vertebral arteries in the neck. COMPARISON:  None. FINDINGS: Criteria: Quantification of carotid stenosis is based on velocity parameters that correlate the residual internal carotid diameter with NASCET-based stenosis levels, using the diameter of the distal internal carotid lumen as the  denominator for stenosis measurement. The following velocity measurements were obtained: RIGHT ICA:  66/22 cm/sec CCA:  79/48 cm/sec SYSTOLIC ICA/CCA RATIO:  0.7 DIASTOLIC ICA/CCA RATIO:  0.9 ECA:  112 cm/sec LEFT ICA:  77/20 cm/sec CCA:  016/55 cm/sec SYSTOLIC ICA/CCA RATIO:  0.6 DIASTOLIC ICA/CCA RATIO:  0.7 ECA:  129 cm/sec RIGHT CAROTID ARTERY: No evidence of right-sided carotid plaque or stenosis. RIGHT VERTEBRAL ARTERY: Antegrade flow with normal waveform and velocity. LEFT CAROTID ARTERY: Minimal amount of focal calcified plaque is present at the level of the left ICA origin. No significant stenosis identified with estimated left ICA stenosis of less than 50%. LEFT VERTEBRAL ARTERY: Antegrade flow with normal waveform and velocity. IMPRESSION: Minimal plaque at left ICA origin with estimated left ICA stenosis of less than 50%. No evidence of right-sided carotid plaque or stenosis. Electronically Signed   By: Aletta Edouard M.D.   On: 12/14/2016 12:47    Scheduled Meds: . bacitracin  1 application Topical V7S  . chlorhexidine  15 mL Mouth/Throat Daily  . docusate sodium  100 mg Oral BID  . pantoprazole  40 mg Oral Daily    Assessment/Plan:  1. Repeated vasovagal syncope yesterday.  Telemetry monitoring overnight showed sinus tachycardia and sinus rhythm.  Echocardiogram and carotid ultrasound unremarkable.  Likely due to anesthesia and/or pain medications.  Patient feeling well today.  Taper off oxygen.  If able to get off oxygen will likely be discharged. 2. Acute hypoxic respiratory failure likely due to atelectasis.  Continue incentive spirometry.  I took the patient off oxygen.  Nursing staff will check oxygen on room air with ambulation.  If this is okay he can likely get out of the hospital. 3. GERD on Protonix 4. Status post left superficial parotidectomy  Code Status:     Code Status Orders        Start     Ordered   12/13/16 1337  Full code  Continuous     12/13/16 1336     Code Status History    Date Active Date Inactive Code Status Order ID Comments User Context   This patient has a current code status but no historical code status.     Family Communication: Family at bedside Disposition Plan: Likely discharge today  Consultants:  ENT  Cardiology  Procedures:  Superficial left parotidectomy  Time spent: 26 minutes  Loletha Grayer  [employer]

## 2016-12-15 NOTE — Final Progress Note (Signed)
.. 12/15/2016 9:39 AM  Benjamin Gutierrez [id number]  Post-Op Day 2    Temp:  [97.7 F (36.5 C)-97.9 F (36.6 C)] 97.9 F (36.6 C) (10/27 0606) Pulse Rate:  [78-101] 78 (10/27 0606) Resp:  [18-20] 18 (10/27 0606) BP: (111-148)/(79-89) 111/79 (10/27 0606) SpO2:  [93 %-94 %] 94 % (10/27 0606),     Intake/Output Summary (Last 24 hours) at 12/15/16 0939 Last data filed at 12/15/16 6213  Gross per 24 hour  Intake              750 ml  Output              525 ml  Net              225 ml    Results for orders placed or performed during the hospital encounter of 12/13/16 (from the past 24 hour(s))  CBC with Differential/Platelet     Status: Abnormal   Collection Time: 12/14/16 10:30 AM  Result Value Ref Range   WBC 12.3 (H) 3.8 - 10.6 K/uL   RBC 4.63 4.40 - 5.90 MIL/uL   Hemoglobin 14.1 13.0 - 18.0 g/dL   HCT 41.6 40.0 - 52.0 %   MCV 89.9 80.0 - 100.0 fL   MCH 30.4 26.0 - 34.0 pg   MCHC 33.8 32.0 - 36.0 g/dL   RDW 13.5 11.5 - 14.5 %   Platelets 167 150 - 440 K/uL   Neutrophils Relative % 82 %   Neutro Abs 10.1 (H) 1.4 - 6.5 K/uL   Lymphocytes Relative 10 %   Lymphs Abs 1.2 1.0 - 3.6 K/uL   Monocytes Relative 8 %   Monocytes Absolute 0.9 0.2 - 1.0 K/uL   Eosinophils Relative 0 %   Eosinophils Absolute 0.0 0 - 0.7 K/uL   Basophils Relative 0 %   Basophils Absolute 0.0 0 - 0.1 K/uL  Comprehensive metabolic panel     Status: Abnormal   Collection Time: 12/14/16 10:30 AM  Result Value Ref Range   Sodium 138 135 - 145 mmol/L   Potassium 4.2 3.5 - 5.1 mmol/L   Chloride 101 101 - 111 mmol/L   CO2 28 22 - 32 mmol/L   Glucose, Bld 115 (H) 65 - 99 mg/dL   BUN 13 6 - 20 mg/dL   Creatinine, Ser 0.95 0.61 - 1.24 mg/dL   Calcium 9.2 8.9 - 10.3 mg/dL   Total Protein 6.8 6.5 - 8.1 g/dL   Albumin 3.6 3.5 - 5.0 g/dL   AST 26 15 - 41 U/L   ALT 22 17 - 63 U/L   Alkaline Phosphatase 43 38 - 126 U/L   Total Bilirubin 1.0 0.3 - 1.2 mg/dL   GFR calc non Af Amer >60 >60 mL/min   GFR  calc Af Amer >60 >60 mL/min   Anion gap 9 5 - 15    SUBJECTIVE:  No acute events.  Placed back on oxygen last night.  OBJECTIVE:   GEN-  NAD, sitting upright in chair NEURO-  Improved left facial nerve weakness.  Still mild weakness of buccal branch.  Complete eye closure.  Oral competence NECK-  Incision c/d/i with improved edema posteriorly and improved erythema  IMPRESSION:  S/p superficial parotidectomy POD#2  PLAN:  1)  Appreciate Medicine help with patient post-operatively.  OK Echo and Carotid dopplers.  2)  Wean Oxygen  3)  OK to discharge from ENT perspective once patient able to be weaned from oxygen  Kimari Coudriet 12/15/2016, 9:39 AM

## 2016-12-17 NOTE — Anesthesia Postprocedure Evaluation (Signed)
Anesthesia Post Note  Patient: Benjamin Gutierrez  Procedure(s) Performed: TOTAL PAROTIDECTOMY WITH FACIAL NERVE MONITORING (Left )  Patient location during evaluation: PACU Anesthesia Type: General Level of consciousness: awake and alert Pain management: pain level controlled Vital Signs Assessment: post-procedure vital signs reviewed and stable Respiratory status: spontaneous breathing, nonlabored ventilation, respiratory function stable and patient connected to nasal cannula oxygen Cardiovascular status: blood pressure returned to baseline and stable Postop Assessment: no apparent nausea or vomiting Anesthetic complications: no     Last Vitals:  Vitals:   12/14/16 2112 12/15/16 0606  BP: (!) 148/89 111/79  Pulse: (!) 101 78  Resp: 20 18  Temp: 36.5 C 36.6 C  SpO2: 93% 94%    Last Pain:  Vitals:   12/15/16 0921  TempSrc:   PainSc: 0-No pain                 Molli Barrows

## 2017-01-04 ENCOUNTER — Ambulatory Visit: Payer: PPO | Admitting: Urology

## 2017-01-04 VITALS — BP 130/84 | HR 114 | Ht 68.0 in | Wt 184.4 lb

## 2017-01-04 DIAGNOSIS — Z125 Encounter for screening for malignant neoplasm of prostate: Secondary | ICD-10-CM

## 2017-01-04 DIAGNOSIS — N529 Male erectile dysfunction, unspecified: Secondary | ICD-10-CM

## 2017-01-04 DIAGNOSIS — N400 Enlarged prostate without lower urinary tract symptoms: Secondary | ICD-10-CM

## 2017-01-04 NOTE — Progress Notes (Signed)
01/04/2017 1:45 PM   Benjamin Gutierrez December 28, 1950 [id number]  Referring provider: Margo Common, [place], [place] [address]  Chief Complaint  Patient presents with  . Benign Prostatic Hypertrophy    HPI: The patient is a 66 year old male presents for routine follow-up.  1. BPH Has been on Rapaflo in the past. This was cost prohibitive service so he was switched Flomax. He experienced orthostatic hypotension, so he stopped this medication. Currently he is off medications. He has minimal symptoms at this time.  He has nocturia x1.  He has a good stream.  He feels he empties his bladder.  He is not bothered by any of his urinary symptoms.  2. Erectile dysfunction He is currently on sildenafil. In the past, he was on caialis, but it was cost prohibitive. He only takes 20 mg of sildenafil with good results. If he takes more than that he gets a headache.  3. Prostate cancer screening -Due for PSA/DRE. Last PSA 0.9 in April 2017.  3. Right varicocele -Renal ultrasound negative.   PMH: Past Medical History:  Diagnosis Date  . Acid reflux 08/06/2014  . Anxiety 08/06/2014  . Arthritis   . Blood in feces 08/06/2014  . 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 Decreased libido 08/06/2014  . Dizziness and giddiness 08/06/2014  . ED (erectile dysfunction) of organic origin 08/06/2014  . GERD (gastroesophageal reflux disease)   . H/O arthrodesis 08/06/2014  . Leg varices 08/06/2014    Surgical History: Past Surgical History:  Procedure Laterality Date  . CARDIAC CATHETERIZATION  1993  . CERVICAL SPINE SURGERY  2009   radiculopathy from past fusion and arthritic changes  . COLONOSCOPY  2013  . ESOPHAGOGASTRODUODENOSCOPY ENDOSCOPY    . LAMINECTOMY  2005  . TOTAL PAROTIDECTOMY WITH FACIAL NERVE MONITORING Left 12/13/2016   Performed by Carloyn Manner, MD at [facility] ORS    Home Medications:  Allergies as of 01/04/2017      Reactions     Betadine [povidone Iodine] Itching   Fentanyl Other (See Comments)   Pain patch and after passed out and admitted to the hospital   Penicillins Rash      Medication List        Accurate as of 01/04/17  1:45 PM. Always use your most recent med list.          amoxicillin-clavulanate 875-125 MG tablet Commonly known as:  AUGMENTIN   bacitracin ointment Apply 1 application topically every 8 (eight) hours.   chlorhexidine 0.12 % solution Commonly known as:  PERIDEX Use as directed 15 mLs in the mouth or throat daily.   dexlansoprazole 60 MG capsule Commonly known as:  DEXILANT Take 60 mg by mouth daily.   docusate sodium 100 MG capsule Commonly known as:  COLACE Take 1 capsule (100 mg total) by mouth 2 (two) times daily.   HYDROcodone-acetaminophen 7.5-325 mg/15 ml solution Commonly known as:  HYCET Take 10 mLs by mouth every 6 (six) hours as needed for moderate pain.   promethazine 25 MG tablet Commonly known as:  PHENERGAN Take 1 tablet (25 mg total) by mouth every 6 (six) hours as needed for nausea or vomiting.       Allergies:  Allergies  Allergen Reactions  . Betadine [Povidone Iodine] Itching  . Fentanyl Other (See Comments)    Pain patch and after passed out and admitted to the hospital  . Penicillins Rash    Family History:  Family History  Problem Relation Age of Onset  . Esophageal cancer Mother   . Alcohol abuse Father   . Bone cancer Brother   . Prostate cancer Brother   . Parkinson's disease Maternal Uncle   . Dementia Paternal Grandmother     Social History:  reports that  has never smoked. he has never used smokeless tobacco. He reports that he drinks alcohol. He reports that he does not use drugs.  ROS: UROLOGY Frequent Urination?: No Hard to postpone urination?: No Burning/pain with urination?: No Get up at night to urinate?: No Leakage of urine?: No Urine stream starts and stops?: No Trouble starting stream?: No Do you have to  strain to urinate?: No Blood in urine?: No Urinary tract infection?: No Sexually transmitted disease?: No Injury to kidneys or bladder?: No Painful intercourse?: No Weak stream?: No Erection problems?: No Penile pain?: No  Gastrointestinal Nausea?: No Vomiting?: No Indigestion/heartburn?: No Diarrhea?: No Constipation?: No  Constitutional Fever: No Night sweats?: No Weight loss?: No Fatigue?: No  Skin Skin rash/lesions?: No Itching?: No  Eyes Blurred vision?: No Double vision?: No  Ears/Nose/Throat Sore throat?: No Sinus problems?: No  Hematologic/Lymphatic Swollen glands?: No Easy bruising?: No  Cardiovascular Leg swelling?: No Chest pain?: No  Respiratory Cough?: No Shortness of breath?: No  Endocrine Excessive thirst?: No  Musculoskeletal Back pain?: No Joint pain?: No  Neurological Headaches?: No Dizziness?: No  Psychologic Depression?: No Anxiety?: No  Physical Exam: BP 130/84 (BP Location: Right Arm, Patient Position: Sitting, Cuff Size: Normal)   Pulse (!) 114   Ht 5\' 8"  (1.727 m)   Wt 184 lb 6.4 oz (83.6 kg)   BMI 28.04 kg/m   Constitutional:  Alert and oriented, No acute distress. HEENT: [place] AT, moist mucus membranes.  Trachea midline, no masses. Cardiovascular: No clubbing, cyanosis, or edema. Respiratory: Normal respiratory effort, no increased work of breathing. GI: Abdomen is soft, nontender, nondistended, no abdominal masses GU: No CVA tenderness.  DRE 30 g smooth benign Skin: No rashes, bruises or suspicious lesions. Lymph: No cervical or inguinal adenopathy. Neurologic: Grossly intact, no focal deficits, moving all 4 extremities. Psychiatric: Normal mood and affect.  Laboratory Data: Lab Results  Component Value Date   WBC 12.3 (H) 12/14/2016   HGB 14.1 12/14/2016   HCT 41.6 12/14/2016   MCV 89.9 12/14/2016   PLT 167 12/14/2016    Lab Results  Component Value Date   CREATININE 0.95 12/14/2016    No results  found for: PSA  No results found for: TESTOSTERONE  No results found for: HGBA1C  Urinalysis    Component Value Date/Time   APPEARANCEUR Clear 11/05/2014 1031   GLUCOSEU Negative 11/05/2014 1031   BILIRUBINUR Negative 11/05/2014 1031   PROTEINUR Negative 11/05/2014 1031   UROBILINOGEN 0.2 09/24/2014 1123   NITRITE Negative 11/05/2014 1031   LEUKOCYTESUR Negative 11/05/2014 1031     Assessment & Plan:    1. Benign prostatic hypertrophy - Asymptomatic. No treatment necessary  2. ED -  Continue sildenafil prn  3. Prostate cancer screening Due for PSA today  Return in about 1 year (around 01/04/2018).  Nickie Retort, MD  [facility] [address], [place], [place] [address] [phone]

## 2017-01-05 LAB — PSA: Prostate Specific Ag, Serum: 1 ng/mL (ref 0.0–4.0)

## 2017-01-18 ENCOUNTER — Telehealth: Payer: Self-pay

## 2017-01-18 NOTE — Telephone Encounter (Signed)
Nickie Retort, MD  Lestine Box, LPN        Please let patient know PSA was normal.    Will send a letter.

## 2017-05-24 DIAGNOSIS — D2310 Other benign neoplasm of skin of unspecified eyelid, including canthus: Secondary | ICD-10-CM | POA: Diagnosis not present

## 2017-06-10 DIAGNOSIS — H0014 Chalazion left upper eyelid: Secondary | ICD-10-CM | POA: Diagnosis not present

## 2017-06-27 ENCOUNTER — Encounter: Payer: Self-pay | Admitting: Family Medicine

## 2017-06-27 ENCOUNTER — Ambulatory Visit (INDEPENDENT_AMBULATORY_CARE_PROVIDER_SITE_OTHER): Payer: PPO | Admitting: Family Medicine

## 2017-06-27 VITALS — BP 124/82 | HR 102 | Temp 98.7°F | Wt 186.2 lb

## 2017-06-27 DIAGNOSIS — H00014 Hordeolum externum left upper eyelid: Secondary | ICD-10-CM | POA: Diagnosis not present

## 2017-06-27 DIAGNOSIS — R000 Tachycardia, unspecified: Secondary | ICD-10-CM | POA: Diagnosis not present

## 2017-06-27 DIAGNOSIS — R251 Tremor, unspecified: Secondary | ICD-10-CM

## 2017-06-27 MED ORDER — METOPROLOL TARTRATE 25 MG PO TABS: 25.0000 mg | ORAL_TABLET | Freq: Two times a day (BID) | ORAL | 3 refills | Status: DC

## 2017-06-27 NOTE — Progress Notes (Signed)
Patient: Benjamin Gutierrez Male    DOB: 09-Aug-1950   67 y.o.   MRN: [id number] Visit Date: 06/27/2017  Today's Provider: Vernie Murders, PA   Chief Complaint  Patient presents with  . Tremors   Subjective:    Neurologic Problem  The patient's primary symptoms include a visual change. Primary symptoms comment: tremors . This is a new problem. Episode onset: approximately 1 year ago. The problem has been gradually worsening since onset. Affected Side: right hand. Associated symptoms include fatigue and headaches. Past treatments include nothing.   Past Medical History:  Diagnosis Date  . Acid reflux 08/06/2014  . Anxiety 08/06/2014  . Arthritis   . Blood in feces 08/06/2014  . 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 Decreased libido 08/06/2014  . Dizziness and giddiness 08/06/2014  . ED (erectile dysfunction) of organic origin 08/06/2014  . GERD (gastroesophageal reflux disease)   . H/O arthrodesis 08/06/2014  . Leg varices 08/06/2014   Patient Active Problem List   Diagnosis Date Noted  . Syncope   . H/O superficial parotidectomy 12/13/2016  . BPH (benign prostatic hyperplasia) 09/10/2016  . Osteoarthrosis, hand 08/06/2014  . Anxiety 08/06/2014  . Blood in feces 08/06/2014  . Acid reflux 08/06/2014  . ED (erectile dysfunction) of organic origin 08/06/2014  . Dizziness and giddiness 08/06/2014  . H/O arthrodesis 08/06/2014  . Decreased libido 08/06/2014  . Avitaminosis D 08/06/2014  . Leg varices 08/06/2014  . 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 LBP (low back pain) 04/05/2006   Past Surgical History:  Procedure Laterality Date  . CARDIAC CATHETERIZATION  1993  . CERVICAL SPINE SURGERY  2009   radiculopathy from past fusion and arthritic changes  . COLONOSCOPY  2013  . ESOPHAGOGASTRODUODENOSCOPY ENDOSCOPY    . LAMINECTOMY  2005  . PAROTIDECTOMY Left 12/13/2016   Procedure: TOTAL PAROTIDECTOMY WITH FACIAL NERVE MONITORING;  Surgeon: Carloyn Manner, MD;  Location: [facility] ORS;  Service: ENT;  Laterality: Left;   Family History  Problem Relation Age of Onset  . Esophageal cancer Mother   . Alcohol abuse Father   . Bone cancer Brother   . Prostate cancer Brother   . Parkinson's disease Maternal Uncle   . Dementia Paternal Grandmother    Allergies  Allergen Reactions  . Betadine [Povidone Iodine] Itching  . Fentanyl Other (See Comments)    Pain patch and after passed out and admitted to the hospital  . Penicillins Rash    Current Outpatient Medications:  .  bacitracin ointment, Apply 1 application topically every 8 (eight) hours., Disp: 120 g, Rfl: 0 .  chlorhexidine (PERIDEX) 0.12 % solution, Use as directed 15 mLs in the mouth or throat daily., Disp: , Rfl:  .  dexlansoprazole (DEXILANT) 60 MG capsule, Take 60 mg by mouth daily., Disp: , Rfl:  .  docusate sodium (COLACE) 100 MG capsule, Take 1 capsule (100 mg total) by mouth 2 (two) times daily., Disp: 10 capsule, Rfl: 0  Review of Systems  Constitutional: Positive for fatigue.  Respiratory: Negative.   Cardiovascular: Negative.   Neurological: Positive for tremors and headaches.   Social History   Tobacco Use  . Smoking status: Never Smoker  . Smokeless tobacco: Never Used  Substance Use Topics  . Alcohol use: Yes    Alcohol/week: 0.0 oz    Comment: rarely   Objective:   BP 124/82 (BP Location: Right Arm, Patient Position: Sitting, Cuff Size: Normal)  Pulse (!) 102   Temp 98.7 F (37.1 C) (Oral)   Wt 186 lb 3.2 oz (84.5 kg)   SpO2 97%   BMI 28.31 kg/m   Physical Exam  Constitutional: He is oriented to person, place, and time. He appears well-developed and well-nourished. No distress.  HENT:  Head: Normocephalic and atraumatic.  Right Ear: Hearing normal.  Left Ear: Hearing normal.  Nose: Nose normal.  Eyes: Lids are normal. Right eye exhibits no discharge. Left eye exhibits no discharge. No scleral icterus.  Stye left upper eyelid with erythema  and swelling of the lid edge.  Cardiovascular: Normal rate and regular rhythm.  Pulmonary/Chest: Effort normal. No respiratory distress.  Musculoskeletal: Normal range of motion.  Neurological: He is alert and oriented to person, place, and time.  Tremor both forearms with occasional more significant shake in the right thumb. Atrophy of the right interosseous muscles.   Skin: Skin is intact. No lesion and no rash noted.  Psychiatric: He has a normal mood and affect. His speech is normal and behavior is normal. Thought content normal.      Assessment & Plan:     1. Tremor of both hands Worsening the past year. No balance or gait issues. Some weakness in hands (left worse than right) since cervical laminectomy with fusion 2005 & 2009. Family history positive for Parkinson's Disease in an uncle. Will get routine labs and consider evaluation by neurologist. - metoprolol tartrate (LOPRESSOR) 25 MG tablet; Take 1 tablet (25 mg total) by mouth 2 (two) times daily.  Dispense: 60 tablet; Refill: 3 - CBC with Differential/Platelet - Comprehensive metabolic panel - TSH  2. Hordeolum externum of left upper eyelid Followed by Dr. Mordecai Rasmussen (ophthalmologist) for recurrent stye the past month.   3. Tachycardia No palpitations, dyspnea or chest pains. Will check labs and given Metoprolol Tartrate 25 mg qd - if no better in 3 days go up to BID. Recheck pending lab reports. - metoprolol tartrate (LOPRESSOR) 25 MG tablet; Take 1 tablet (25 mg total) by mouth 2 (two) times daily.  Dispense: 60 tablet; Refill: 3 - CBC with Differential/Platelet - Comprehensive metabolic panel - TSH       Vernie Murders, PA  [facility]

## 2017-06-28 LAB — TSH: TSH: 1.17 u[IU]/mL (ref 0.450–4.500)

## 2017-06-28 LAB — COMPREHENSIVE METABOLIC PANEL
A/G RATIO: 1.9 (ref 1.2–2.2)
ALBUMIN: 4.4 g/dL (ref 3.6–4.8)
ALK PHOS: 58 IU/L (ref 39–117)
ALT: 26 IU/L (ref 0–44)
AST: 25 IU/L (ref 0–40)
BILIRUBIN TOTAL: 0.9 mg/dL (ref 0.0–1.2)
BUN / CREAT RATIO: 15 (ref 10–24)
BUN: 15 mg/dL (ref 8–27)
CO2: 25 mmol/L (ref 20–29)
Calcium: 9.1 mg/dL (ref 8.6–10.2)
Chloride: 105 mmol/L (ref 96–106)
Creatinine, Ser: 1.01 mg/dL (ref 0.76–1.27)
GFR calc Af Amer: 88 mL/min/{1.73_m2} (ref >59)
GFR calc non Af Amer: 76 mL/min/{1.73_m2} (ref >59)
GLOBULIN, TOTAL: 2.3 g/dL (ref 1.5–4.5)
Glucose: 103 mg/dL — ABNORMAL HIGH (ref 65–99)
POTASSIUM: 4.2 mmol/L (ref 3.5–5.2)
SODIUM: 143 mmol/L (ref 134–144)
Total Protein: 6.7 g/dL (ref 6.0–8.5)

## 2017-06-28 LAB — CBC WITH DIFFERENTIAL/PLATELET
BASOS: 0 %
Basophils Absolute: 0 10*3/uL (ref 0.0–0.2)
EOS (ABSOLUTE): 0 10*3/uL (ref 0.0–0.4)
EOS: 0 %
HEMATOCRIT: 42.9 % (ref 37.5–51.0)
HEMOGLOBIN: 14.5 g/dL (ref 13.0–17.7)
Immature Grans (Abs): 0 10*3/uL (ref 0.0–0.1)
Immature Granulocytes: 0 %
LYMPHS ABS: 1.8 10*3/uL (ref 0.7–3.1)
Lymphs: 34 %
MCH: 30.3 pg (ref 26.6–33.0)
MCHC: 33.8 g/dL (ref 31.5–35.7)
MCV: 90 fL (ref 79–97)
MONOCYTES: 9 %
MONOS ABS: 0.5 10*3/uL (ref 0.1–0.9)
NEUTROS ABS: 3 10*3/uL (ref 1.4–7.0)
Neutrophils: 57 %
Platelets: 178 10*3/uL (ref 150–379)
RBC: 4.79 x10E6/uL (ref 4.14–5.80)
RDW: 13.3 % (ref 12.3–15.4)
WBC: 5.2 10*3/uL (ref 3.4–10.8)

## 2017-07-22 DIAGNOSIS — H0014 Chalazion left upper eyelid: Secondary | ICD-10-CM | POA: Diagnosis not present

## 2017-08-13 ENCOUNTER — Encounter: Payer: Self-pay | Admitting: Family Medicine

## 2017-08-13 ENCOUNTER — Ambulatory Visit (INDEPENDENT_AMBULATORY_CARE_PROVIDER_SITE_OTHER): Payer: PPO | Admitting: Family Medicine

## 2017-08-13 VITALS — BP 126/80 | HR 119 | Temp 99.2°F | Wt 183.8 lb

## 2017-08-13 DIAGNOSIS — R05 Cough: Secondary | ICD-10-CM | POA: Diagnosis not present

## 2017-08-13 DIAGNOSIS — R51 Headache: Secondary | ICD-10-CM | POA: Diagnosis not present

## 2017-08-13 DIAGNOSIS — R5381 Other malaise: Secondary | ICD-10-CM | POA: Diagnosis not present

## 2017-08-13 DIAGNOSIS — R519 Headache, unspecified: Secondary | ICD-10-CM

## 2017-08-13 DIAGNOSIS — R059 Cough, unspecified: Secondary | ICD-10-CM

## 2017-08-13 NOTE — Progress Notes (Signed)
Patient: Benjamin Gutierrez Male    DOB: [birth year]   67 y.o.   MRN: [id number] Visit Date: 08/13/2017  Today's Provider: Vernie Murders, PA   Chief Complaint  Patient presents with  . URI   Subjective:    URI   This is a new problem. Episode onset: Sunday night. The problem has been gradually worsening. Associated symptoms include congestion, coughing and headaches (frontal). Chest pain: chest soreness. Sinus pain: facial and eye pain. Associated symptoms comments: Weakness, loss of appetite and post nasal drainage . Treatments tried: Mucinex D. The treatment provided mild relief.      Patient Active Problem List   Diagnosis Date Noted  . Syncope   . H/O superficial parotidectomy 12/13/2016  . BPH (benign prostatic hyperplasia) 09/10/2016  . Osteoarthrosis, hand 08/06/2014  . Anxiety 08/06/2014  . Blood in feces 08/06/2014  . Acid reflux 08/06/2014  . ED (erectile dysfunction) of organic origin 08/06/2014  . Dizziness and giddiness 08/06/2014  . H/O arthrodesis 08/06/2014  . Decreased libido 08/06/2014  . Avitaminosis D 08/06/2014  . Leg varices 08/06/2014  . 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 LBP (low back pain) 04/05/2006   Past Surgical History:  Procedure Laterality Date  . CARDIAC CATHETERIZATION  1993  . CERVICAL SPINE SURGERY  2009   radiculopathy from past fusion and arthritic changes  . COLONOSCOPY  2013  . ESOPHAGOGASTRODUODENOSCOPY ENDOSCOPY    . LAMINECTOMY  2005  . PAROTIDECTOMY Left 12/13/2016   Procedure: TOTAL PAROTIDECTOMY WITH FACIAL NERVE MONITORING;  Surgeon: Carloyn Manner, MD;  Location: [facility] ORS;  Service: ENT;  Laterality: Left;   Family History  Problem Relation Age of Onset  . Esophageal cancer Mother   . Alcohol abuse Father   . Bone cancer Brother   . Prostate cancer Brother   . Parkinson's disease Maternal Uncle   . Dementia Paternal Grandmother    Allergies  Allergen Reactions  . Betadine [Povidone Iodine] Itching  .  Fentanyl Other (See Comments)    Pain patch and after passed out and admitted to the hospital  . Penicillins Rash    Current Outpatient Medications:  .  bacitracin ointment, Apply 1 application topically every 8 (eight) hours., Disp: 120 g, Rfl: 0 .  chlorhexidine (PERIDEX) 0.12 % solution, Use as directed 15 mLs in the mouth or throat daily., Disp: , Rfl:  .  dexlansoprazole (DEXILANT) 60 MG capsule, Take 60 mg by mouth daily., Disp: , Rfl:  .  metoprolol tartrate (LOPRESSOR) 25 MG tablet, Take 1 tablet (25 mg total) by mouth 2 (two) times daily., Disp: 60 tablet, Rfl: 3  Review of Systems  Constitutional: Negative.   HENT: Positive for congestion. Sinus pain: facial and eye pain.   Respiratory: Positive for cough.   Cardiovascular: Negative.  Chest pain: chest soreness.  Neurological: Positive for headaches (frontal).   Social History   Tobacco Use  . Smoking status: Never Smoker  . Smokeless tobacco: Never Used  Substance Use Topics  . Alcohol use: Not Currently    Alcohol/week: 0.0 oz    Frequency: Never   Objective:   BP 126/80 (BP Location: Right Arm, Patient Position: Sitting, Cuff Size: Normal)   Pulse (!) 119   Temp 99.2 F (37.3 C) (Oral)   Wt 183 lb 12.8 oz (83.4 kg)   SpO2 94%   BMI 27.95 kg/m   Physical Exam  Constitutional: He is oriented to person, place, and time. He appears well-developed and well-nourished.  No distress.  HENT:  Head: Normocephalic and atraumatic.  Right Ear: Hearing and external ear normal.  Left Ear: Hearing and external ear normal.  Nose: Nose normal.  Mouth/Throat: Oropharynx is clear and moist.  Sinuses transilluminate well.   Eyes: Conjunctivae and lids are normal. Right eye exhibits no discharge. Left eye exhibits no discharge. No scleral icterus.  Cardiovascular: Regular rhythm and normal heart sounds.  Tachycardia.  Pulmonary/Chest: Effort normal and breath sounds normal. No respiratory distress.  Musculoskeletal: Normal  range of motion.  Neurological: He is alert and oriented to person, place, and time.  Skin: Skin is intact. No lesion and no rash noted.  Psychiatric: He has a normal mood and affect. His speech is normal and behavior is normal. Thought content normal.      Assessment & Plan:     1. Cough Onset 08-11-17 after returning home from a cruise in [place]. Some chills but no sputum production. Hacking hard cough caused headache to be worse and some gagging. May use Meclizine prn nausea and add Delsym or Robitussin-DM to suppress cough. Continue Mucinex-D prn stuffiness or PND. Increase fluid intake and will check CBC with diff. No sign of rales or rhonchi to ausculation of chest. Recheck pending lab reports.  2. Acute nonintractable headache, unspecified headache type Onset with cough and malaise 2 days ago. No sign of sinusitis or URI congestion. May use Tylenol or Advil prn. Increase fluid intake and check CBC. - CBC with Differential/Platelet  3. Malaise Onset 08-11-17 with some chills. Suspect viral syndrome. Will check CBC with diff to rule out bacterial infection. - CBC with Differential/Platelet       Vernie Murders, PA  [place]

## 2017-08-14 ENCOUNTER — Telehealth: Payer: Self-pay | Admitting: Family Medicine

## 2017-08-14 LAB — CBC WITH DIFFERENTIAL/PLATELET
BASOS: 0 %
Basophils Absolute: 0 10*3/uL (ref 0.0–0.2)
EOS (ABSOLUTE): 0 10*3/uL (ref 0.0–0.4)
EOS: 0 %
Hematocrit: 42.7 % (ref 37.5–51.0)
Hemoglobin: 15.2 g/dL (ref 13.0–17.7)
IMMATURE GRANS (ABS): 0 10*3/uL (ref 0.0–0.1)
IMMATURE GRANULOCYTES: 0 %
LYMPHS: 10 %
Lymphocytes Absolute: 0.8 10*3/uL (ref 0.7–3.1)
MCH: 30.3 pg (ref 26.6–33.0)
MCHC: 35.6 g/dL (ref 31.5–35.7)
MCV: 85 fL (ref 79–97)
MONOCYTES: 11 %
Monocytes Absolute: 0.9 10*3/uL (ref 0.1–0.9)
NEUTROS PCT: 79 %
Neutrophils Absolute: 6.3 10*3/uL (ref 1.4–7.0)
Platelets: 139 10*3/uL — ABNORMAL LOW (ref 150–450)
RBC: 5.01 x10E6/uL (ref 4.14–5.80)
RDW: 14 % (ref 12.3–15.4)
WBC: 8.1 10*3/uL (ref 3.4–10.8)

## 2017-08-14 NOTE — Telephone Encounter (Signed)
Pt's wife called wanting to know if we have gotten results back from the labs yesterday  Pt's cell is [phone]  Thanks teri

## 2017-08-15 NOTE — Telephone Encounter (Signed)
Patient advised. He states he feels better today.

## 2017-08-15 NOTE — Telephone Encounter (Signed)
Essentially normal blood counts. No sign of bacterial infection. Probably a viral illness. If fever over 101 should recheck. Increase fluid intake and continue Tylenol or Advil prn.

## 2017-08-26 DIAGNOSIS — D225 Melanocytic nevi of trunk: Secondary | ICD-10-CM | POA: Diagnosis not present

## 2017-08-26 DIAGNOSIS — X32XXXA Exposure to sunlight, initial encounter: Secondary | ICD-10-CM | POA: Diagnosis not present

## 2017-08-26 DIAGNOSIS — L570 Actinic keratosis: Secondary | ICD-10-CM | POA: Diagnosis not present

## 2017-08-26 DIAGNOSIS — D2272 Melanocytic nevi of left lower limb, including hip: Secondary | ICD-10-CM | POA: Diagnosis not present

## 2017-08-26 DIAGNOSIS — L821 Other seborrheic keratosis: Secondary | ICD-10-CM | POA: Diagnosis not present

## 2017-08-26 DIAGNOSIS — Z08 Encounter for follow-up examination after completed treatment for malignant neoplasm: Secondary | ICD-10-CM | POA: Diagnosis not present

## 2017-08-26 DIAGNOSIS — D2261 Melanocytic nevi of right upper limb, including shoulder: Secondary | ICD-10-CM | POA: Diagnosis not present

## 2017-08-26 DIAGNOSIS — Z85828 Personal history of other malignant neoplasm of skin: Secondary | ICD-10-CM | POA: Diagnosis not present

## 2017-08-26 DIAGNOSIS — D2262 Melanocytic nevi of left upper limb, including shoulder: Secondary | ICD-10-CM | POA: Diagnosis not present

## 2017-08-26 DIAGNOSIS — D2271 Melanocytic nevi of right lower limb, including hip: Secondary | ICD-10-CM | POA: Diagnosis not present

## 2017-10-17 DIAGNOSIS — D485 Neoplasm of uncertain behavior of skin: Secondary | ICD-10-CM | POA: Diagnosis not present

## 2017-10-18 ENCOUNTER — Other Ambulatory Visit: Payer: Self-pay | Admitting: Family Medicine

## 2017-10-18 ENCOUNTER — Ambulatory Visit (INDEPENDENT_AMBULATORY_CARE_PROVIDER_SITE_OTHER): Payer: PPO

## 2017-10-18 VITALS — BP 122/72 | HR 73 | Temp 98.0°F | Ht 68.0 in | Wt 189.8 lb

## 2017-10-18 DIAGNOSIS — R251 Tremor, unspecified: Secondary | ICD-10-CM

## 2017-10-18 DIAGNOSIS — Z0000 Encounter for general adult medical examination without abnormal findings: Secondary | ICD-10-CM

## 2017-10-18 DIAGNOSIS — R000 Tachycardia, unspecified: Secondary | ICD-10-CM

## 2017-10-18 NOTE — Patient Instructions (Signed)
Benjamin Gutierrez , Thank you for taking time to come for your Medicare Wellness Visit. I appreciate your ongoing commitment to your health goals. Please review the following plan we discussed and let me know if I can assist you in the future.   Screening recommendations/referrals: Colonoscopy: Up to date Recommended yearly ophthalmology/optometry visit for glaucoma screening and checkup Recommended yearly dental visit for hygiene and checkup  Vaccinations: Influenza vaccine: N/A Pneumococcal vaccine: Up to date Tdap vaccine: Up to date Shingles vaccine: Up to date, second Shingrix due after 10/29/17    Advanced directives: Please bring a copy of your POA (Power of [place]) and/or Living Will to your next appointment.   Conditions/risks identified: None.   Next appointment: Pt declined scheduling his CPE for 2019 and his AWV for 2020.   Preventive Care 34 Years and Older, Male Preventive care refers to lifestyle choices and visits with your health care provider that can promote health and wellness. What does preventive care include?  A yearly physical exam. This is also called an annual well check.  Dental exams once or twice a year.  Routine eye exams. Ask your health care provider how often you should have your eyes checked.  Personal lifestyle choices, including:  Daily care of your teeth and gums.  Regular physical activity.  Eating a healthy diet.  Avoiding tobacco and drug use.  Limiting alcohol use.  Practicing safe sex.  Taking low doses of aspirin every day.  Taking vitamin and mineral supplements as recommended by your health care provider. What happens during an annual well check? The services and screenings done by your health care provider during your annual well check will depend on your age, overall health, lifestyle risk factors, and family history of disease. Counseling  Your health care provider may ask you questions about your:  Alcohol use.  Tobacco  use.  Drug use.  Emotional well-being.  Home and relationship well-being.  Sexual activity.  Eating habits.  History of falls.  Memory and ability to understand (cognition).  Work and work Statistician. Screening  You may have the following tests or measurements:  Height, weight, and BMI.  Blood pressure.  Lipid and cholesterol levels. These may be checked every 5 years, or more frequently if you are over 46 years old.  Skin check.  Lung cancer screening. You may have this screening every year starting at age 69 if you have a 30-pack-year history of smoking and currently smoke or have quit within the past 15 years.  Fecal occult blood test (FOBT) of the stool. You may have this test every year starting at age 69.  Flexible sigmoidoscopy or colonoscopy. You may have a sigmoidoscopy every 5 years or a colonoscopy every 10 years starting at age 69.  Prostate cancer screening. Recommendations will vary depending on your family history and other risks.  Hepatitis C blood test.  Hepatitis B blood test.  Sexually transmitted disease (STD) testing.  Diabetes screening. This is done by checking your blood sugar (glucose) after you have not eaten for a while (fasting). You may have this done every 1-3 years.  Abdominal aortic aneurysm (AAA) screening. You may need this if you are a current or former smoker.  Osteoporosis. You may be screened starting at age 25 if you are at high risk. Talk with your health care provider about your test results, treatment options, and if necessary, the need for more tests. Vaccines  Your health care provider may recommend certain vaccines, such as:  Influenza vaccine. This is recommended every year.  Tetanus, diphtheria, and acellular pertussis (Tdap, Td) vaccine. You may need a Td booster every 10 years.  Zoster vaccine. You may need this after age 17.  Pneumococcal 13-valent conjugate (PCV13) vaccine. One dose is recommended after age  69.  Pneumococcal polysaccharide (PPSV23) vaccine. One dose is recommended after age 69. Talk to your health care provider about which screenings and vaccines you need and how often you need them. This information is not intended to replace advice given to you by your health care provider. Make sure you discuss any questions you have with your health care provider. Document Released: 03/04/2015 Document Revised: 10/26/2015 Document Reviewed: 12/07/2014 Elsevier Interactive Patient Education  2017 [place] Prevention in the Home Falls can cause injuries. They can happen to people of all ages. There are many things you can do to make your home safe and to help prevent falls. What can I do on the outside of my home?  Regularly fix the edges of walkways and driveways and fix any cracks.  Remove anything that might make you trip as you walk through a door, such as a raised step or threshold.  Trim any bushes or trees on the path to your home.  Use bright outdoor lighting.  Clear any walking paths of anything that might make someone trip, such as rocks or tools.  Regularly check to see if handrails are loose or broken. Make sure that both sides of any steps have handrails.  Any raised decks and porches should have guardrails on the edges.  Have any leaves, snow, or ice cleared regularly.  Use sand or salt on walking paths during winter.  Clean up any spills in your garage right away. This includes oil or grease spills. What can I do in the bathroom?  Use night lights.  Install grab bars by the toilet and in the tub and shower. Do not use towel bars as grab bars.  Use non-skid mats or decals in the tub or shower.  If you need to sit down in the shower, use a plastic, non-slip stool.  Keep the floor dry. Clean up any water that spills on the floor as soon as it happens.  Remove soap buildup in the tub or shower regularly.  Attach bath mats securely with double-sided  non-slip rug tape.  Do not have throw rugs and other things on the floor that can make you trip. What can I do in the bedroom?  Use night lights.  Make sure that you have a light by your bed that is easy to reach.  Do not use any sheets or blankets that are too big for your bed. They should not hang down onto the floor.  Have a firm chair that has side arms. You can use this for support while you get dressed.  Do not have throw rugs and other things on the floor that can make you trip. What can I do in the kitchen?  Clean up any spills right away.  Avoid walking on wet floors.  Keep items that you use a lot in easy-to-reach places.  If you need to reach something above you, use a strong step stool that has a grab bar.  Keep electrical cords out of the way.  Do not use floor polish or wax that makes floors slippery. If you must use wax, use non-skid floor wax.  Do not have throw rugs and other things on the floor that  can make you trip. What can I do with my stairs?  Do not leave any items on the stairs.  Make sure that there are handrails on both sides of the stairs and use them. Fix handrails that are broken or loose. Make sure that handrails are as long as the stairways.  Check any carpeting to make sure that it is firmly attached to the stairs. Fix any carpet that is loose or worn.  Avoid having throw rugs at the top or bottom of the stairs. If you do have throw rugs, attach them to the floor with carpet tape.  Make sure that you have a light switch at the top of the stairs and the bottom of the stairs. If you do not have them, ask someone to add them for you. What else can I do to help prevent falls?  Wear shoes that:  Do not have high heels.  Have rubber bottoms.  Are comfortable and fit you well.  Are closed at the toe. Do not wear sandals.  If you use a stepladder:  Make sure that it is fully opened. Do not climb a closed stepladder.  Make sure that both  sides of the stepladder are locked into place.  Ask someone to hold it for you, if possible.  Clearly mark and make sure that you can see:  Any grab bars or handrails.  First and last steps.  Where the edge of each step is.  Use tools that help you move around (mobility aids) if they are needed. These include:  Canes.  Walkers.  Scooters.  Crutches.  Turn on the lights when you go into a dark area. Replace any light bulbs as soon as they burn out.  Set up your furniture so you have a clear path. Avoid moving your furniture around.  If any of your floors are uneven, fix them.  If there are any pets around you, be aware of where they are.  Review your medicines with your doctor. Some medicines can make you feel dizzy. This can increase your chance of falling. Ask your doctor what other things that you can do to help prevent falls. This information is not intended to replace advice given to you by your health care provider. Make sure you discuss any questions you have with your health care provider. Document Released: 12/02/2008 Document Revised: 07/14/2015 Document Reviewed: 03/12/2014 Elsevier Interactive Patient Education  2017 [employer].

## 2017-10-18 NOTE — Progress Notes (Addendum)
Subjective:   Benjamin Gutierrez is a 67 y.o. male who presents for Medicare Annual/Subsequent preventive examination.  Review of Systems:  N/A Cardiac Risk Factors include: advanced age (>12men, >80 women)     Objective:    Vitals: BP 122/72 (BP Location: Right Arm)   Pulse 73   Temp 98 F (36.7 C) (Oral)   Ht 5\' 8"  (1.727 m)   Wt 189 lb 12.8 oz (86.1 kg)   BMI 28.86 kg/m   Body mass index is 28.86 kg/m.  Advanced Directives 10/18/2017 12/13/2016 12/13/2016 12/13/2016 12/07/2016 09/10/2016 08/15/2015  Does Patient Have a Medical Advance Directive? Yes No No No No Yes No  Type of Paramedic of [place];Living will - - - - Press photographer;Living will -  Copy of [place] in Chart? No - copy requested - - - - - -  Would patient like information on creating a medical advance directive? - No - Patient declined No - Patient declined No - Patient declined Yes (MAU/Ambulatory/Procedural Areas - Information given) - -    Tobacco Social History   Tobacco Use  Smoking Status Never Smoker  Smokeless Tobacco Never Used     Counseling given: Not Answered   Clinical Intake:  Pre-visit preparation completed: Yes  Pain : No/denies pain Pain Score: 0-No pain     Nutritional Status: BMI 25 -29 Overweight Nutritional Risks: None Diabetes: No  How often do you need to have someone help you when you read instructions, pamphlets, or other written materials from your doctor or pharmacy?: 1 - Never  Interpreter Needed?: No  Information entered by :: [facility], LPN  Past Medical History:  Diagnosis Date  . Acid reflux 08/06/2014  . Anxiety 08/06/2014  . Arthritis   . Blood in feces 08/06/2014  . 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 Decreased libido 08/06/2014  . Dizziness and giddiness 08/06/2014  . ED (erectile dysfunction) of organic origin 08/06/2014  . GERD (gastroesophageal reflux disease)   . H/O  arthrodesis 08/06/2014  . Leg varices 08/06/2014   Past Surgical History:  Procedure Laterality Date  . CARDIAC CATHETERIZATION  1993  . CERVICAL SPINE SURGERY  2009   radiculopathy from past fusion and arthritic changes  . COLONOSCOPY  2013  . ESOPHAGOGASTRODUODENOSCOPY ENDOSCOPY    . LAMINECTOMY  2005  . PAROTIDECTOMY Left 12/13/2016   Procedure: TOTAL PAROTIDECTOMY WITH FACIAL NERVE MONITORING;  Surgeon: Carloyn Manner, MD;  Location: [facility] ORS;  Service: ENT;  Laterality: Left;   Family History  Problem Relation Age of Onset  . Esophageal cancer Mother   . Alcohol abuse Father   . Bone cancer Brother   . Prostate cancer Brother   . Parkinson's disease Maternal Uncle   . Dementia Paternal Grandmother    Social History   Socioeconomic History  . Marital status: Married    Spouse name: Bethena Roys  . Number of children: 2  . Years of education: 41  . Highest education level: High school graduate  Occupational History  . Occupation: Retired  Scientific laboratory technician  . Financial resource strain: Not hard at all  . Food insecurity:    Worry: Never true    Inability: Never true  . Transportation needs:    Medical: No    Non-medical: No  Tobacco Use  . Smoking status: Never Smoker  . Smokeless tobacco: Never Used  Substance and Sexual Activity  . Alcohol use: Not Currently  Alcohol/week: 0.0 standard drinks    Frequency: Never  . Drug use: No  . Sexual activity: Yes    Birth control/protection: None  Lifestyle  . Physical activity:    Days per week: Not on file    Minutes per session: Not on file  . Stress: Not at all  Relationships  . Social connections:    Talks on phone: Not on file    Gets together: Not on file    Attends religious service: Not on file    Active member of club or organization: Not on file    Attends meetings of clubs or organizations: Not on file    Relationship status: Not on file  Other Topics Concern  . Not on file  Social History Narrative  .  Not on file    Outpatient Encounter Medications as of 10/18/2017  Medication Sig  . bacitracin ointment Apply 1 application topically every 8 (eight) hours. (Patient taking differently: Apply 1 application topically as needed. )  . chlorhexidine (PERIDEX) 0.12 % solution Use as directed 15 mLs in the mouth or throat daily.  Marland Kitchen dexlansoprazole (DEXILANT) 60 MG capsule Take 60 mg by mouth daily.  . metoprolol tartrate (LOPRESSOR) 25 MG tablet Take 1 tablet (25 mg total) by mouth 2 (two) times daily.  . naproxen (EC NAPROSYN) 500 MG EC tablet Take 500 mg by mouth as needed.    No facility-administered encounter medications on file as of 10/18/2017.     Activities of Daily Living In your present state of health, do you have any difficulty performing the following activities: 10/18/2017 12/13/2016  Hearing? Y N  Comment Does not wear hearing aids.  -  Vision? N N  Difficulty concentrating or making decisions? N N  Walking or climbing stairs? N N  Dressing or bathing? N N  Doing errands, shopping? N N  Preparing Food and eating ? N -  Using the Toilet? N -  In the past six months, have you accidently leaked urine? N -  Do you have problems with loss of bowel control? N -  Managing your Medications? N -  Managing your Finances? N -  Housekeeping or managing your Housekeeping? N -  Some recent data might be hidden    Patient Care Team: Chrismon, Vickki Muff, PA as PCP - General (Physician Assistant) Karle Starch, MD as Referring Physician (Ophthalmology) Nickie Retort, MD as Consulting Physician (Urology)   Assessment:   This is a routine wellness examination for Benjamin Gutierrez.  Exercise Activities and Dietary recommendations Current Exercise Habits: Structured exercise class, Type of exercise: Other - see comments;stretching(stationary bicycle), Time (Minutes): 30, Frequency (Times/Week): 2, Weekly Exercise (Minutes/Week): 60, Intensity: Mild, Exercise limited by: None identified  Goals    None     Fall Risk Fall Risk  10/18/2017 09/10/2016 08/15/2015  Falls in the past year? No No No   Is the patient's home free of loose throw rugs in walkways, pet beds, electrical cords, etc?   yes      Grab bars in the bathroom? no      Handrails on the stairs?   yes      Adequate lighting?   yes  Timed Get Up and Go Performed: N/A  Depression Screen PHQ 2/9 Scores 10/18/2017 10/18/2017 09/10/2016 08/15/2015  PHQ - 2 Score 0 0 0 0  PHQ- 9 Score 0 - - -    Cognitive Function     6CIT Screen 10/18/2017  What Year? 0  points  What month? 0 points  What time? 0 points  Count back from 20 0 points  Months in reverse 0 points  Repeat phrase 0 points  Total Score 0    Immunization History  Administered Date(s) Administered  . H1N1 01/02/2008  . Influenza-Unspecified 11/22/2015, 12/04/2016  . Pneumococcal Conjugate-13 05/28/2014  . Pneumococcal Polysaccharide-23 11/22/2015  . Td 11/22/2004  . Tdap 10/10/2010  . Zoster 09/01/2010, 12/25/2012  . Zoster Recombinat (Shingrix) 08/28/2017    Qualifies for Shingles Vaccine? Up to date on the first Shingrix vaccine, second one due after 10/29/17.  Screening Tests Health Maintenance  Topic Date Due  . INFLUENZA VACCINE  09/19/2017  . TETANUS/TDAP  10/09/2020  . COLONOSCOPY  06/17/2024  . Hepatitis C Screening  Completed  . PNA vac Low Risk Adult  Completed   Cancer Screenings: Lung: Low Dose CT Chest recommended if Age 66-80 years, 30 pack-year currently smoking OR have quit w/in 15years. Patient does not qualify. Colorectal: Up to date  Additional Screenings:  Hepatitis C Screening: Up to date      Plan:  I have personally reviewed and addressed the Medicare Annual Wellness questionnaire and have noted the following in the patient's chart:  A. Medical and social history B. Use of alcohol, tobacco or illicit drugs  C. Current medications and supplements D. Functional ability and status E.  Nutritional status F.    Physical activity G. Advance directives H. List of other physicians I.  Hospitalizations, surgeries, and ER visits in previous 12 months [place] such as hearing and vision if needed, cognitive and depression L. Referrals and appointments - none  In addition, I have reviewed and discussed with patient certain preventive protocols, quality metrics, and best practice recommendations. A written personalized care plan for preventive services as well as general preventive health recommendations were provided to patient.  See attached scanned questionnaire for additional information.   Signed,  Fabio Neighbors, LPN Nurse Health Advisor   Nurse Recommendations: None.   Reviewed Nurse Health Advisor's documentation note and recommendations. Agree with plan and will review with patient at next appointment.

## 2017-11-14 ENCOUNTER — Ambulatory Visit (INDEPENDENT_AMBULATORY_CARE_PROVIDER_SITE_OTHER): Payer: PPO | Admitting: Family Medicine

## 2017-11-14 ENCOUNTER — Encounter: Payer: Self-pay | Admitting: Family Medicine

## 2017-11-14 VITALS — BP 118/76 | HR 69 | Temp 98.3°F | Ht 68.0 in | Wt 190.0 lb

## 2017-11-14 DIAGNOSIS — M19041 Primary osteoarthritis, right hand: Secondary | ICD-10-CM | POA: Diagnosis not present

## 2017-11-14 DIAGNOSIS — I8393 Asymptomatic varicose veins of bilateral lower extremities: Secondary | ICD-10-CM | POA: Diagnosis not present

## 2017-11-14 DIAGNOSIS — Z981 Arthrodesis status: Secondary | ICD-10-CM | POA: Diagnosis not present

## 2017-11-14 DIAGNOSIS — Z23 Encounter for immunization: Secondary | ICD-10-CM | POA: Diagnosis not present

## 2017-11-14 DIAGNOSIS — R3912 Poor urinary stream: Secondary | ICD-10-CM

## 2017-11-14 DIAGNOSIS — N401 Enlarged prostate with lower urinary tract symptoms: Secondary | ICD-10-CM

## 2017-11-14 DIAGNOSIS — M19042 Primary osteoarthritis, left hand: Secondary | ICD-10-CM

## 2017-11-14 DIAGNOSIS — G250 Essential tremor: Secondary | ICD-10-CM

## 2017-11-14 DIAGNOSIS — R12 Heartburn: Secondary | ICD-10-CM | POA: Diagnosis not present

## 2017-11-14 DIAGNOSIS — Z13220 Encounter for screening for lipoid disorders: Secondary | ICD-10-CM

## 2017-11-14 MED ORDER — DEXLANSOPRAZOLE 60 MG PO CPDR: 60.0000 mg | DELAYED_RELEASE_CAPSULE | Freq: Every day | ORAL | 1 refills | Status: DC

## 2017-11-14 NOTE — Progress Notes (Signed)
Patient: Benjamin Gutierrez, Male    DOB: [birth year], 67 y.o.   MRN: [id number] Visit Date: 11/14/2017  Today's Provider: Vernie Murders, PA   Chief Complaint  Patient presents with  . Medicare Wellness   Subjective:     Complete Physical Benjamin Gutierrez is a 67 y.o. male. He feels well. He reports exercising regularly. He reports he is sleeping well.  -----------------------------------------------------------   Review of Systems  Constitutional: Negative.   HENT: Negative.   Eyes: Negative.   Respiratory: Negative.   Cardiovascular: Negative.   Gastrointestinal: Negative.   Endocrine: Negative.   Genitourinary: Negative.   Musculoskeletal: Negative.   Skin: Negative.   Allergic/Immunologic: Negative.   Neurological: Negative.   Hematological: Negative.   Psychiatric/Behavioral: Negative.     Social History   Socioeconomic History  . Marital status: Married    Spouse name: Bethena Roys  . Number of children: 2  . Years of education: 57  . Highest education level: High school graduate  Occupational History  . Occupation: Retired  Scientific laboratory technician  . Financial resource strain: Not hard at all  . Food insecurity:    Worry: Never true    Inability: Never true  . Transportation needs:    Medical: No    Non-medical: No  Tobacco Use  . Smoking status: Never Smoker  . Smokeless tobacco: Never Used  Substance and Sexual Activity  . Alcohol use: Not Currently    Alcohol/week: 0.0 standard drinks    Frequency: Never  . Drug use: No  . Sexual activity: Yes    Birth control/protection: None  Lifestyle  . Physical activity:    Days per week: Not on file    Minutes per session: Not on file  . Stress: Not at all  Relationships  . Social connections:    Talks on phone: Not on file    Gets together: Not on file    Attends religious service: Not on file    Active member of club or organization: Not on file    Attends meetings of clubs or organizations: Not on  file    Relationship status: Not on file  . Intimate partner violence:    Fear of current or ex partner: Not on file    Emotionally abused: Not on file    Physically abused: Not on file    Forced sexual activity: Not on file  Other Topics Concern  . Not on file  Social History Narrative  . Not on file    Past Medical History:  Diagnosis Date  . Acid reflux 08/06/2014  . Anxiety 08/06/2014  . Arthritis   . Blood in feces 08/06/2014  . 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 Decreased libido 08/06/2014  . Dizziness and giddiness 08/06/2014  . ED (erectile dysfunction) of organic origin 08/06/2014  . GERD (gastroesophageal reflux disease)   . H/O arthrodesis 08/06/2014  . Leg varices 08/06/2014     Patient Active Problem List   Diagnosis Date Noted  . Syncope   . H/O superficial parotidectomy 12/13/2016  . BPH (benign prostatic hyperplasia) 09/10/2016  . Osteoarthrosis, hand 08/06/2014  . Anxiety 08/06/2014  . Blood in feces 08/06/2014  . Acid reflux 08/06/2014  . ED (erectile dysfunction) of organic origin 08/06/2014  . Dizziness and giddiness 08/06/2014  . H/O arthrodesis 08/06/2014  . Decreased libido 08/06/2014  . Avitaminosis D 08/06/2014  . Leg varices 08/06/2014  . Brachial neuritis  01/02/2008  . LBP (low back pain) 04/05/2006   Past Surgical History:  Procedure Laterality Date  . CARDIAC CATHETERIZATION  1993  . CERVICAL SPINE SURGERY  2009   radiculopathy from past fusion and arthritic changes  . COLONOSCOPY  2013  . ESOPHAGOGASTRODUODENOSCOPY ENDOSCOPY    . LAMINECTOMY  2005  . PAROTIDECTOMY Left 12/13/2016   Procedure: TOTAL PAROTIDECTOMY WITH FACIAL NERVE MONITORING;  Surgeon: Carloyn Manner, MD;  Location: [facility] ORS;  Service: ENT;  Laterality: Left;    His family history includes Alcohol abuse in his father; Bone cancer in his brother; Dementia in his paternal grandmother; Esophageal cancer in his mother; Parkinson's disease in his  maternal uncle; Prostate cancer in his brother.      Current Outpatient Medications:  .  bacitracin ointment, Apply 1 application topically every 8 (eight) hours. (Patient taking differently: Apply 1 application topically as needed. ), Disp: 120 g, Rfl: 0 .  chlorhexidine (PERIDEX) 0.12 % solution, Use as directed 15 mLs in the mouth or throat daily., Disp: , Rfl:  .  dexlansoprazole (DEXILANT) 60 MG capsule, Take 60 mg by mouth daily., Disp: , Rfl:  .  metoprolol tartrate (LOPRESSOR) 25 MG tablet, TAKE 1 TABLET BY MOUTH TWICE A DAY, Disp: 60 tablet, Rfl: 3 .  naproxen (EC NAPROSYN) 500 MG EC tablet, Take 500 mg by mouth as needed. , Disp: , Rfl:   Patient Care Team: Zaul Hubers, Vickki Muff, PA as PCP - General (Physician Assistant) Karle Starch, MD as Referring Physician (Ophthalmology) Nickie Retort, MD as Consulting Physician (Urology)     Objective:   Vitals: BP 118/76 (BP Location: Right Arm, Patient Position: Sitting, Cuff Size: Normal)   Pulse 69   Temp 98.3 F (36.8 C) (Oral)   Ht 5\' 8"  (1.727 m)   Wt 190 lb (86.2 kg)   SpO2 95%   BMI 28.89 kg/m   Physical Exam  Constitutional: He is oriented to person, place, and time. He appears well-developed and well-nourished.  HENT:  Head: Normocephalic and atraumatic.  Right Ear: External ear normal.  Left Ear: External ear normal.  Nose: Nose normal.  Mouth/Throat: Oropharynx is clear and moist.  Eyes: Pupils are equal, round, and reactive to light. Conjunctivae and EOM are normal. Right eye exhibits no discharge.  Neck: Normal range of motion. Neck supple. No tracheal deviation present. No thyromegaly present.  Cardiovascular: Normal rate, regular rhythm, normal heart sounds and intact distal pulses.  No murmur heard. Pulmonary/Chest: Effort normal and breath sounds normal. No respiratory distress. He has no wheezes. He has no rales. He exhibits no tenderness.  Abdominal: Soft. Bowel sounds are normal. He exhibits no  distension and no mass. There is no tenderness. There is no rebound and no guarding.  Genitourinary: Penis normal. Rectal exam shows guaiac negative stool.  Genitourinary Comments: Prostate enlarged without nodules.  Musculoskeletal: He exhibits no edema or tenderness.  Lymphadenopathy:    He has no cervical adenopathy.  Neurological: He is alert and oriented to person, place, and time. He has normal reflexes. He displays normal reflexes. No cranial nerve deficit. He exhibits normal muscle tone. Coordination normal.  Skin: Skin is warm and dry. No rash noted. No erythema.  Psychiatric: He has a normal mood and affect. His behavior is normal. Judgment and thought content normal.    Activities of Daily Living In your present state of health, do you have any difficulty performing the following activities: 10/18/2017 12/13/2016  Hearing? Y N  Comment  Does not wear hearing aids.  -  Vision? N N  Difficulty concentrating or making decisions? N N  Walking or climbing stairs? N N  Dressing or bathing? N N  Doing errands, shopping? N N  Preparing Food and eating ? N -  Using the Toilet? N -  In the past six months, have you accidently leaked urine? N -  Do you have problems with loss of bowel control? N -  Managing your Medications? N -  Managing your Finances? N -  Housekeeping or managing your Housekeeping? N -  Some recent data might be hidden    Fall Risk Assessment Fall Risk  10/18/2017 09/10/2016 08/15/2015  Falls in the past year? No No No     Depression Screen PHQ 2/9 Scores 10/18/2017 10/18/2017 09/10/2016 08/15/2015  PHQ - 2 Score 0 0 0 0  PHQ- 9 Score 0 - - -    Assessment & Plan:    Annual Physical Reviewed patient's Family Medical History Reviewed and updated list of patient's medical providers Assessment of cognitive impairment was done Assessed patient's functional ability Established a written schedule for health screening [place] Completed and  Reviewed  Exercise Activities and Dietary recommendations Goals   None     Immunization History  Administered Date(s) Administered  . H1N1 01/02/2008  . Influenza-Unspecified 11/22/2015, 12/04/2016  . Pneumococcal Conjugate-13 05/28/2014  . Pneumococcal Polysaccharide-23 11/22/2015  . Td 11/22/2004  . Tdap 10/10/2010  . Zoster 09/01/2010, 12/25/2012  . Zoster Recombinat (Shingrix) 08/28/2017    Health Maintenance  Topic Date Due  . INFLUENZA VACCINE  09/19/2017  . TETANUS/TDAP  10/09/2020  . COLONOSCOPY  06/17/2024  . Hepatitis C Screening  Completed  . PNA vac Low Risk Adult  Completed     Discussed health benefits of physical activity, and encouraged him to engage in regular exercise appropriate for his age and condition.    ------------------------------------------------------------------------------------------------------------ 1. Osteoarthritis of both hands, unspecified osteoarthritis type Aches in both hands with slight enlargement of PIP joints. No tenderness or redness. Atrophy of the right hand interosseous muscle between 1st and 2nd metacarpal. History of weakened grip bilaterally since cervical laminectomy (2005)  with fusion (2009 with chronic arthritis). Uses Aleve with good control of symptoms. Recheck routine labs. - CBC with Differential/Platelet  2. Benign prostatic hyperplasia with weak urinary stream PSA was 1.2 in November 2018. No worsening of stream or more nocturia. Stable DRE without nodules.  3. Benign familial tremor Improvement in tremor of the right hand when he uses the Metoprolol regularly. Has a little trouble remembering to take it because of chronic heartburn. Advised to take with a small snack to decrease dyspepsia. Recheck routine labs. - CBC with Differential/Platelet - Comprehensive metabolic panel  4. Asymptomatic varicose veins of both lower extremities Unchanged varicose veins of lower legs. No edema or discomfort. Good pulses  throughout.  5. Hx of fusion of cervical spine Well healed scar anterior base of neck from surgeries 2005 for laminectomy and 2009 for fusion. Some weakness in grip bilaterally and muscle wasting in the right hand only. Some arthritis stiffness and rotation to 45 degrees bilaterally. Uses Aleve for aches and pains with good relief.  6. Screening cholesterol level No palpitations, chest pains or dyspnea to indicate CAD. Will check Lipid Panel and CMP. - Comprehensive metabolic panel - Lipid panel  7. Chronic heartburn Finds use of Dexilant daily controls heartburn, reflux and hiatal hernia symptoms. Upper endoscopy by Dr. Allen Norris in 2016  showed a little gastritis. No hematemesis or melena. Some sore throat that ENT felt may be due to reflux. Some breakthrough dyspepsia even with taking Dexilant daily. Will check for H.pylori and may need follow up with GI to re-evaluate. Refilled Dexilant. - dexlansoprazole (DEXILANT) 60 MG capsule; Take 1 capsule (60 mg total) by mouth daily.  Dispense: 90 capsule; Refill: 1 - H. pylori breath test  8. Needs flu shot - Flu Vaccine QUAD 6+ mos PF IM (Fluarix Quad PF)    Vernie Murders, PA  [facility]

## 2017-11-15 ENCOUNTER — Telehealth: Payer: Self-pay

## 2017-11-15 LAB — CBC WITH DIFFERENTIAL/PLATELET
BASOS: 1 %
Basophils Absolute: 0 10*3/uL (ref 0.0–0.2)
EOS (ABSOLUTE): 0.1 10*3/uL (ref 0.0–0.4)
Eos: 1 %
Hematocrit: 43.3 % (ref 37.5–51.0)
Hemoglobin: 14.7 g/dL (ref 13.0–17.7)
Immature Grans (Abs): 0 10*3/uL (ref 0.0–0.1)
Immature Granulocytes: 0 %
Lymphocytes Absolute: 2.2 10*3/uL (ref 0.7–3.1)
Lymphs: 35 %
MCH: 30.1 pg (ref 26.6–33.0)
MCHC: 33.9 g/dL (ref 31.5–35.7)
MCV: 89 fL (ref 79–97)
MONOS ABS: 0.5 10*3/uL (ref 0.1–0.9)
Monocytes: 9 %
NEUTROS PCT: 54 %
Neutrophils Absolute: 3.4 10*3/uL (ref 1.4–7.0)
PLATELETS: 158 10*3/uL (ref 150–450)
RBC: 4.89 x10E6/uL (ref 4.14–5.80)
RDW: 12.9 % (ref 12.3–15.4)
WBC: 6.3 10*3/uL (ref 3.4–10.8)

## 2017-11-15 LAB — COMPREHENSIVE METABOLIC PANEL
A/G RATIO: 1.8 (ref 1.2–2.2)
ALT: 26 IU/L (ref 0–44)
AST: 19 IU/L (ref 0–40)
Albumin: 4.2 g/dL (ref 3.6–4.8)
Alkaline Phosphatase: 60 IU/L (ref 39–117)
BILIRUBIN TOTAL: 0.9 mg/dL (ref 0.0–1.2)
BUN/Creatinine Ratio: 12 (ref 10–24)
BUN: 13 mg/dL (ref 8–27)
CALCIUM: 8.9 mg/dL (ref 8.6–10.2)
CHLORIDE: 104 mmol/L (ref 96–106)
CO2: 24 mmol/L (ref 20–29)
Creatinine, Ser: 1.08 mg/dL (ref 0.76–1.27)
GFR calc Af Amer: 81 mL/min/{1.73_m2} (ref >59)
GFR calc non Af Amer: 70 mL/min/{1.73_m2} (ref >59)
Globulin, Total: 2.4 g/dL (ref 1.5–4.5)
Glucose: 86 mg/dL (ref 65–99)
POTASSIUM: 4.4 mmol/L (ref 3.5–5.2)
Sodium: 143 mmol/L (ref 134–144)
Total Protein: 6.6 g/dL (ref 6.0–8.5)

## 2017-11-15 LAB — LIPID PANEL
CHOLESTEROL TOTAL: 168 mg/dL (ref 100–199)
Chol/HDL Ratio: 3.2 ratio (ref 0.0–5.0)
HDL: 52 mg/dL (ref >39)
LDL Calculated: 99 mg/dL (ref 0–99)
TRIGLYCERIDES: 83 mg/dL (ref 0–149)
VLDL Cholesterol Cal: 17 mg/dL (ref 5–40)

## 2017-11-15 NOTE — Telephone Encounter (Signed)
-----   Message from Margo Common, [place] sent at 11/15/2017  7:24 AM EDT ----- All labs normal. Continue present medications and recheck annually

## 2017-11-15 NOTE — Telephone Encounter (Signed)
Left message advising pt.  (Per DPR)  Thanks,   -Demiyah Fischbach  

## 2017-11-16 LAB — H. PYLORI BREATH TEST: H pylori Breath Test: NEGATIVE

## 2017-11-18 ENCOUNTER — Telehealth: Payer: Self-pay

## 2017-11-18 NOTE — Telephone Encounter (Signed)
Pt advised.   Thanks,   -Laura  

## 2017-11-18 NOTE — Telephone Encounter (Signed)
-----   Message from Margo Common, [place] sent at 11/17/2017  8:55 PM EDT ----- H. Pylori test negative. If heartburn continues to be a problem and you have to acid reduction medications more frequently, will need to have gastroenterology referral.

## 2018-01-02 DIAGNOSIS — D485 Neoplasm of uncertain behavior of skin: Secondary | ICD-10-CM | POA: Diagnosis not present

## 2018-01-02 DIAGNOSIS — L820 Inflamed seborrheic keratosis: Secondary | ICD-10-CM | POA: Diagnosis not present

## 2018-01-02 HISTORY — PX: EYE SURGERY: SHX253

## 2018-01-09 ENCOUNTER — Ambulatory Visit: Payer: PPO | Admitting: Urology

## 2018-01-13 ENCOUNTER — Telehealth: Payer: Self-pay

## 2018-01-13 NOTE — Telephone Encounter (Signed)
Left neck pain/stiffness radiating left arm on and off times several months. Patient reports that he had neck surgery 10 yrs  Ago, and reports he may need to be referred back to neurosurgery. appt 01/14/18 @8am .

## 2018-01-14 ENCOUNTER — Other Ambulatory Visit: Payer: Self-pay

## 2018-01-14 ENCOUNTER — Ambulatory Visit (INDEPENDENT_AMBULATORY_CARE_PROVIDER_SITE_OTHER): Payer: PPO | Admitting: Family Medicine

## 2018-01-14 ENCOUNTER — Encounter: Payer: Self-pay | Admitting: Family Medicine

## 2018-01-14 VITALS — BP 110/78 | HR 67 | Temp 97.9°F | Ht 68.0 in | Wt 187.6 lb

## 2018-01-14 DIAGNOSIS — R29898 Other symptoms and signs involving the musculoskeletal system: Secondary | ICD-10-CM

## 2018-01-14 DIAGNOSIS — Z981 Arthrodesis status: Secondary | ICD-10-CM

## 2018-01-14 NOTE — Progress Notes (Signed)
Patient: Benjamin Gutierrez Male    DOB: 03-Feb-1951   67 y.o.   MRN: [id number] Visit Date: 01/14/2018  Today's Provider: Vernie Murders, PA   Chief Complaint  Patient presents with  . Neck Pain    in left side   Subjective:    HPI pt presents with neck pain on left side and has some numbness (muscle weakness) in left arm from possible pinched nerve and headaches with burning sensation.  Pt states this has been going on for about a month (October 2019). Had cervical fusion in 2009. Has helped grandson trim some 10-12 foot limbs from a tree recently.    Past Medical History:  Diagnosis Date  . Acid reflux 08/06/2014  . Anxiety 08/06/2014  . Arthritis   . Blood in feces 08/06/2014  . 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 Decreased libido 08/06/2014  . Dizziness and giddiness 08/06/2014  . ED (erectile dysfunction) of organic origin 08/06/2014  . GERD (gastroesophageal reflux disease)   . H/O arthrodesis 08/06/2014  . Leg varices 08/06/2014   Past Surgical History:  Procedure Laterality Date  . CARDIAC CATHETERIZATION  1993  . CERVICAL SPINE SURGERY  2009   radiculopathy from past fusion and arthritic changes  . COLONOSCOPY  2013  . ESOPHAGOGASTRODUODENOSCOPY ENDOSCOPY    . EYE SURGERY  01/02/2018   right eye lid mole removed  . LAMINECTOMY  2005  . PAROTIDECTOMY Left 12/13/2016   Procedure: TOTAL PAROTIDECTOMY WITH FACIAL NERVE MONITORING;  Surgeon: Carloyn Manner, MD;  Location: [facility] ORS;  Service: ENT;  Laterality: Left;   Family History  Problem Relation Age of Onset  . Esophageal cancer Mother   . Alcohol abuse Father   . Prostate cancer Brother   . Parkinson's disease Maternal Uncle   . Dementia Paternal Grandmother    Allergies  Allergen Reactions  . Betadine [Povidone Iodine] Itching  . Fentanyl Other (See Comments)    Pain patch and after passed out and admitted to the hospital  . Penicillins Rash    Current Outpatient  Medications:  .  acetaminophen (TYLENOL) 500 MG tablet, Take 500 mg by mouth every 6 (six) hours as needed., Disp: , Rfl:  .  bacitracin ointment, Apply 1 application topically every 8 (eight) hours. (Patient taking differently: Apply 1 application topically as needed. ), Disp: 120 g, Rfl: 0 .  chlorhexidine (PERIDEX) 0.12 % solution, Use as directed 15 mLs in the mouth or throat daily., Disp: , Rfl:  .  dexlansoprazole (DEXILANT) 60 MG capsule, Take 1 capsule (60 mg total) by mouth daily., Disp: 90 capsule, Rfl: 1 .  ibuprofen (ADVIL,MOTRIN) 200 MG tablet, Take 200 mg by mouth every 6 (six) hours as needed., Disp: , Rfl:  .  metoprolol tartrate (LOPRESSOR) 25 MG tablet, TAKE 1 TABLET BY MOUTH TWICE A DAY, Disp: 60 tablet, Rfl: 3 .  naproxen (EC NAPROSYN) 500 MG EC tablet, Take 500 mg by mouth as needed. , Disp: , Rfl:   Review of Systems  Constitutional: Negative.   HENT: Negative.   Eyes: Negative.   Respiratory: Negative.   Cardiovascular: Negative.   Gastrointestinal: Negative.   Endocrine: Negative.   Genitourinary: Negative.   Musculoskeletal: Positive for neck pain (left side and arm numbness since October 2019) and neck stiffness.  Skin: Negative.   Allergic/Immunologic: Negative.   Neurological: Positive for headaches.  Hematological: Negative.   Psychiatric/Behavioral: Negative.    Social History  Tobacco Use  . Smoking status: Never Smoker  . Smokeless tobacco: Never Used  Substance Use Topics  . Alcohol use: Not Currently    Alcohol/week: 0.0 standard drinks    Frequency: Never   Objective:   BP 110/78 (BP Location: Right Arm, Patient Position: Sitting, Cuff Size: Normal)   Pulse 67   Temp 97.9 F (36.6 C) (Oral)   Ht 5\' 8"  (1.727 m)   Wt 187 lb 9.6 oz (85.1 kg)   SpO2 97%   BMI 28.52 kg/m  Vitals:   01/14/18 0816  BP: 110/78  Pulse: 67  Temp: 97.9 F (36.6 C)  TempSrc: Oral  SpO2: 97%  Weight: 187 lb 9.6 oz (85.1 kg)  Height: 5\' 8"  (1.727 m)    Physical Exam  Constitutional: He is oriented to person, place, and time. He appears well-developed and well-nourished. No distress.  HENT:  Head: Normocephalic and atraumatic.  Right Ear: Hearing normal.  Left Ear: Hearing normal.  Nose: Nose normal.  Eyes: Conjunctivae and lids are normal. Right eye exhibits no discharge. Left eye exhibits no discharge. No scleral icterus.  Cardiovascular: Normal rate and regular rhythm.  Pulmonary/Chest: Effort normal and breath sounds normal. No respiratory distress.  Musculoskeletal: Normal range of motion.  Decreased ROM of neck with history of fusion in 2005 and 2009. Decreased grip strength in the left arm. Unable to elicit left arm reflexes.  Neurological: He is alert and oriented to person, place, and time.  Skin: Skin is intact. No lesion and no rash noted.  Psychiatric: He has a normal mood and affect. His speech is normal and behavior is normal. Thought content normal.     Assessment & Plan:     1. Weakness of left arm Having more weakness in the left arm over the past 1-2 months. History of cervical fusions in 2005 and 2009 by Dr. Annette Stable (neurosurgeon). States this is similar to symptoms prior to past surgeries. Requests referral back to Dr. Annette Stable. No history of a specific injury but notice weakness worse with strenuous activities. Offered to get follow up MRI but he prefers to see how soon Dr. Annette Stable can see him. - Ambulatory referral to Neurosurgery  2. Hx of fusion of cervical spine Cervical laminectomy/decompression in 2005 and cervical fusion in 2009. Some return of left arm weakness over the past 1-2 months. Unable to elicit DTR's in the left arm and has a history of an essential tremor of hands. Schedule follow up appointment with Dr. Annette Stable (neurosurgeon).       Vernie Murders, PA  [facility]

## 2018-01-20 ENCOUNTER — Telehealth: Payer: Self-pay | Admitting: Family Medicine

## 2018-01-20 ENCOUNTER — Other Ambulatory Visit: Payer: Self-pay | Admitting: Family Medicine

## 2018-01-20 DIAGNOSIS — Z981 Arthrodesis status: Secondary | ICD-10-CM

## 2018-01-20 DIAGNOSIS — R29898 Other symptoms and signs involving the musculoskeletal system: Secondary | ICD-10-CM

## 2018-01-20 DIAGNOSIS — G959 Disease of spinal cord, unspecified: Secondary | ICD-10-CM

## 2018-01-20 NOTE — Telephone Encounter (Signed)
Patient was advised.  

## 2018-01-20 NOTE — Telephone Encounter (Signed)
[facility] is telling pt he is needing a MRI prior to the visit on Dec. 11th.  Please contact pt to let him know what he needs to do to have this done asap.  Thanks, [employer]

## 2018-01-20 NOTE — Telephone Encounter (Signed)
Order placed in chart.

## 2018-01-20 NOTE — Telephone Encounter (Signed)
Please advise 

## 2018-01-25 ENCOUNTER — Ambulatory Visit (HOSPITAL_COMMUNITY)
Admission: RE | Admit: 2018-01-25 | Discharge: 2018-01-25 | Disposition: A | Discharge status: Home / Self Care | Payer: PPO | Source: Ambulatory Visit | Attending: Family Medicine | Admitting: Family Medicine

## 2018-01-25 DIAGNOSIS — M5023 Other cervical disc displacement, cervicothoracic region: Secondary | ICD-10-CM | POA: Insufficient documentation

## 2018-01-25 DIAGNOSIS — G959 Disease of spinal cord, unspecified: Secondary | ICD-10-CM | POA: Diagnosis not present

## 2018-01-25 DIAGNOSIS — M542 Cervicalgia: Secondary | ICD-10-CM | POA: Diagnosis not present

## 2018-01-25 DIAGNOSIS — Z981 Arthrodesis status: Secondary | ICD-10-CM | POA: Insufficient documentation

## 2018-01-25 DIAGNOSIS — R29898 Other symptoms and signs involving the musculoskeletal system: Secondary | ICD-10-CM | POA: Insufficient documentation

## 2018-01-25 DIAGNOSIS — M4802 Spinal stenosis, cervical region: Secondary | ICD-10-CM | POA: Diagnosis not present

## 2018-01-29 DIAGNOSIS — M5412 Radiculopathy, cervical region: Secondary | ICD-10-CM | POA: Diagnosis not present

## 2018-02-05 ENCOUNTER — Encounter: Payer: Self-pay | Admitting: Urology

## 2018-02-05 ENCOUNTER — Other Ambulatory Visit: Payer: Self-pay

## 2018-02-05 ENCOUNTER — Ambulatory Visit: Payer: PPO | Admitting: Urology

## 2018-02-05 VITALS — BP 145/84 | HR 80 | Wt 188.0 lb

## 2018-02-05 DIAGNOSIS — N400 Enlarged prostate without lower urinary tract symptoms: Secondary | ICD-10-CM

## 2018-02-05 DIAGNOSIS — Z125 Encounter for screening for malignant neoplasm of prostate: Secondary | ICD-10-CM | POA: Diagnosis not present

## 2018-02-05 NOTE — Progress Notes (Signed)
   02/05/2018 1:59 PM   Benjamin Gutierrez 1950/05/04 [id number]  Reason for visit: Follow up PSA screening, BPH, ED  HPI: I saw [name] in urology clinic for the first time today in follow-up of PSA screening, BPH, and ED.  He was previously followed by Dr. Pilar Gutierrez for the above complaints.  He does have a history of prostate cancer in his brother.  He is unsure the Gleason score or how this was treated.  His PSAs have been very low, with last value of 1.0 in 12/2016.  Regarding his BPH, he has very minimal symptoms, and has nocturia 1-2 times per night that he is minimally bothered by.  He previously tried Flomax but developed lightheadedness and stopped this medication.  He is very satisfied with his symptoms currently.  In terms of his erectile dysfunction, he previously has taken sildenafil, however he is no longer sexually active at this time.   ROS: Please see flowsheet from today's date for complete review of systems.  Physical Exam: BP (!) 145/84   Pulse 80   Wt 188 lb (85.3 kg)   BMI 28.59 kg/m    Constitutional:  Alert and oriented, No acute distress. Respiratory: Normal respiratory effort, no increased work of breathing. GI: Abdomen is soft, nontender, nondistended, no abdominal masses GU: No CVA tenderness DRE: 40 g, smooth, no nodules Skin: No rashes, bruises or suspicious lesions. Neurologic: Grossly intact, no focal deficits, moving all 4 extremities. Psychiatric: Normal mood and affect  Laboratory Data: PSA 1.0 12/2016  Pertinent Imaging: None to review  Assessment & Plan:   In summary, [name] is a healthy 67 year old man we are following for mild BPH with well-controlled symptoms as well as for PSA screening with a family history in his brother.  We discussed the AUA guidelines that recommend discontinuing screening after age 43.  PSA today, will call with results RTC 1 year for PSA/DRE  A total of 15 minutes were spent face-to-face with the  patient, greater than 50% was spent in patient education, counseling, and coordination of care regarding BPH, PSA screening, and erectile dysfunction.  Benjamin Gutierrez, [facility] [address] [place], [place] [id number] [phone]

## 2018-02-06 LAB — PSA TOTAL (REFLEX TO FREE): Prostate Specific Ag, Serum: 1 ng/mL (ref 0.0–4.0)

## 2018-03-04 DIAGNOSIS — M5412 Radiculopathy, cervical region: Secondary | ICD-10-CM | POA: Diagnosis not present

## 2018-04-02 DIAGNOSIS — M5412 Radiculopathy, cervical region: Secondary | ICD-10-CM | POA: Diagnosis not present

## 2018-04-08 DIAGNOSIS — R208 Other disturbances of skin sensation: Secondary | ICD-10-CM | POA: Diagnosis not present

## 2018-04-08 DIAGNOSIS — L538 Other specified erythematous conditions: Secondary | ICD-10-CM | POA: Diagnosis not present

## 2018-04-08 DIAGNOSIS — L728 Other follicular cysts of the skin and subcutaneous tissue: Secondary | ICD-10-CM | POA: Diagnosis not present

## 2018-04-08 DIAGNOSIS — L720 Epidermal cyst: Secondary | ICD-10-CM | POA: Diagnosis not present

## 2018-04-18 DIAGNOSIS — M4802 Spinal stenosis, cervical region: Secondary | ICD-10-CM | POA: Diagnosis not present

## 2018-04-25 DIAGNOSIS — M4802 Spinal stenosis, cervical region: Secondary | ICD-10-CM | POA: Diagnosis not present

## 2018-04-25 DIAGNOSIS — M5412 Radiculopathy, cervical region: Secondary | ICD-10-CM | POA: Diagnosis not present

## 2018-04-25 DIAGNOSIS — M4803 Spinal stenosis, cervicothoracic region: Secondary | ICD-10-CM | POA: Diagnosis not present

## 2018-05-01 ENCOUNTER — Ambulatory Visit
Admission: RE | Admit: 2018-05-01 | Discharge: 2018-05-01 | Disposition: A | Discharge status: Home / Self Care | Payer: PPO | Attending: Family Medicine | Admitting: Family Medicine

## 2018-05-01 ENCOUNTER — Other Ambulatory Visit: Payer: Self-pay

## 2018-05-01 ENCOUNTER — Ambulatory Visit (INDEPENDENT_AMBULATORY_CARE_PROVIDER_SITE_OTHER): Payer: PPO | Admitting: Family Medicine

## 2018-05-01 ENCOUNTER — Encounter: Payer: Self-pay | Admitting: Family Medicine

## 2018-05-01 ENCOUNTER — Ambulatory Visit
Admission: RE | Admit: 2018-05-01 | Discharge: 2018-05-01 | Disposition: A | Discharge status: Home / Self Care | Payer: PPO | Source: Ambulatory Visit | Attending: Family Medicine | Admitting: Family Medicine

## 2018-05-01 VITALS — BP 124/84 | HR 126 | Temp 98.6°F | Resp 18 | Wt 190.0 lb

## 2018-05-01 DIAGNOSIS — Z981 Arthrodesis status: Secondary | ICD-10-CM | POA: Diagnosis not present

## 2018-05-01 DIAGNOSIS — R05 Cough: Secondary | ICD-10-CM | POA: Insufficient documentation

## 2018-05-01 DIAGNOSIS — R0602 Shortness of breath: Secondary | ICD-10-CM | POA: Insufficient documentation

## 2018-05-01 DIAGNOSIS — R059 Cough, unspecified: Secondary | ICD-10-CM

## 2018-05-01 DIAGNOSIS — G8918 Other acute postprocedural pain: Secondary | ICD-10-CM | POA: Insufficient documentation

## 2018-05-01 NOTE — Progress Notes (Signed)
Patient: Benjamin Gutierrez Male    DOB: [birth year]   68 y.o.   MRN: [id number] Visit Date: 05/01/2018  Today's Provider: Vernie Murders, PA   Chief Complaint  Patient presents with  . Cough   Subjective:     Cough  This is a new problem. The current episode started in the past 7 days (5 days). The problem has been unchanged. The cough is productive of sputum. Associated symptoms include ear congestion, ear pain, headaches, nasal congestion and a sore throat. Pertinent negatives include no chest pain, chills, fever, heartburn, hemoptysis, myalgias, postnasal drip, rash, rhinorrhea, shortness of breath, sweats, weight loss or wheezing. Treatments tried: Mucinex. The treatment provided mild relief.   Patient had cervical surgery 04/25/2018 and has had a cough since then. Cough is productive. Patient also has symptoms of shortness of breath, wheezing, chest tightness, ear pain, headaches, nasal congestion, and sore throat. Patient has been taking Mucinex with mild relief.   Past Medical History:  Diagnosis Date  . Acid reflux 08/06/2014  . Anxiety 08/06/2014  . Arthritis   . Blood in feces 08/06/2014  . 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 Decreased libido 08/06/2014  . Dizziness and giddiness 08/06/2014  . ED (erectile dysfunction) of organic origin 08/06/2014  . GERD (gastroesophageal reflux disease)   . H/O arthrodesis 08/06/2014  . Leg varices 08/06/2014   Past Surgical History:  Procedure Laterality Date  . CARDIAC CATHETERIZATION  1993  . CERVICAL SPINE SURGERY  2009   radiculopathy from past fusion and arthritic changes  . COLONOSCOPY  2013  . ESOPHAGOGASTRODUODENOSCOPY ENDOSCOPY    . EYE SURGERY  01/02/2018   right eye lid mole removed  . LAMINECTOMY  2005  . PAROTIDECTOMY Left 12/13/2016   Procedure: TOTAL PAROTIDECTOMY WITH FACIAL NERVE MONITORING;  Surgeon: Carloyn Manner, MD;  Location: [facility] ORS;  Service: ENT;  Laterality: Left;    Family History  Problem Relation Age of Onset  . Esophageal cancer Mother   . Alcohol abuse Father   . Prostate cancer Brother   . Parkinson's disease Maternal Uncle   . Dementia Paternal Grandmother    Allergies  Allergen Reactions  . Betadine [Povidone Iodine] Itching  . Fentanyl Other (See Comments)    Pain patch and after passed out and admitted to the hospital  . Penicillins Rash    Current Outpatient Medications:  .  acetaminophen (TYLENOL) 500 MG tablet, Take 500 mg by mouth every 6 (six) hours as needed., Disp: , Rfl:  .  chlorhexidine (PERIDEX) 0.12 % solution, Use as directed 15 mLs in the mouth or throat daily., Disp: , Rfl:  .  dexlansoprazole (DEXILANT) 60 MG capsule, Take 1 capsule (60 mg total) by mouth daily., Disp: 90 capsule, Rfl: 1 .  ibuprofen (ADVIL,MOTRIN) 200 MG tablet, Take 200 mg by mouth every 6 (six) hours as needed., Disp: , Rfl:  .  metoprolol tartrate (LOPRESSOR) 25 MG tablet, TAKE 1 TABLET BY MOUTH TWICE A DAY, Disp: 60 tablet, Rfl: 3 .  naproxen (EC NAPROSYN) 500 MG EC tablet, Take 500 mg by mouth as needed. , Disp: , Rfl:  .  bacitracin ointment, Apply 1 application topically every 8 (eight) hours. (Patient not taking: Reported on 05/01/2018), Disp: 120 g, Rfl: 0  Review of Systems  Constitutional: Negative for appetite change, chills, fever and weight loss.  HENT: Positive for congestion, ear pain and sore throat. Negative for postnasal drip,  rhinorrhea, sinus pressure and sinus pain.   Respiratory: Positive for cough and chest tightness. Negative for hemoptysis, shortness of breath and wheezing.   Cardiovascular: Negative for chest pain and palpitations.  Gastrointestinal: Negative for abdominal pain, heartburn, nausea and vomiting.  Musculoskeletal: Negative for myalgias.  Skin: Negative for rash.  Neurological: Positive for headaches.   Social History   Tobacco Use  . Smoking status: Never Smoker  . Smokeless tobacco: Never Used  Substance  Use Topics  . Alcohol use: Not Currently    Alcohol/week: 0.0 standard drinks    Frequency: Never     Objective:   BP 124/84 (BP Location: Right Arm, Patient Position: Sitting, Cuff Size: Large)   Pulse (!) 126   Temp 98.6 F (37 C) (Oral)   Resp 18   Wt 190 lb (86.2 kg)   SpO2 92%   BMI 28.89 kg/m  Vitals:   05/01/18 1334  BP: 124/84  Pulse: (!) 126  Resp: 18  Temp: 98.6 F (37 C)  TempSrc: Oral  SpO2: 92%  Weight: 190 lb (86.2 kg)   Physical Exam Constitutional:      General: He is not in acute distress.    Appearance: He is well-developed.  HENT:     Head: Normocephalic and atraumatic.     Right Ear: Hearing and tympanic membrane normal.     Left Ear: Hearing and tympanic membrane normal.     Nose: Nose normal.     Mouth/Throat:     Comments: Slightly erythematous areas without exudates. Eyes:     General: Lids are normal. No scleral icterus.       Right eye: No discharge.        Left eye: No discharge.     Conjunctiva/sclera: Conjunctivae normal.  Neck:     Musculoskeletal: Muscular tenderness present.     Comments: Tenderness with swelling of the incision site for cervical fusion done by Dr. Trenton Gammon on 04-25-18. Stiff neck and large area of ecchymosis of neck and across chest. No local lymphadenopathy noticed. Cardiovascular:     Rate and Rhythm: Tachycardia present.     Heart sounds: Normal heart sounds.  Pulmonary:     Effort: Pulmonary effort is normal. No respiratory distress.  Abdominal:     General: Bowel sounds are normal.     Palpations: Abdomen is soft.  Musculoskeletal: Normal range of motion.  Skin:    Findings: No lesion or rash.  Neurological:     Mental Status: He is alert and oriented to person, place, and time.     Deep Tendon Reflexes: Reflexes normal.  Psychiatric:        Speech: Speech normal.        Behavior: Behavior normal.        Thought Content: Thought content normal.       Assessment & Plan    1. Cough Developed cough  and sore throat after C-spine surgery on 04-25-18. No fever or hemoptysis, but, some yellowish thick sputum occasionally. Will check labs and x-ray for signs of post operative pneumonia or PE. May use Chloroseptic spray for sore throat and Mucinex-DM for cough with congestion. May need antibiotic pending x-ray and lab tests. Drink extra fluids. - CBC with Differential/Platelet - DG Chest 2 View - D-Dimer, Quantitative  2. Hx of fusion of cervical spine Had fusion of lower C-spine by anterior approach by Dr. Trenton Gammon (neurosurgeon) on 04-25-18. Incision site is healing without purulent discharge or palpable lymphadenopathy. Large area of ecchymosis  down to both sides of upper anterior chest. With cough and sore throat, will check CBC, CXR and D-Dimer. States surgeon felt these symptoms may be due to endotracheal tube for surgery. - D-Dimer, Quantitative     Vernie Murders, PA  [facility]

## 2018-05-02 LAB — CBC WITH DIFFERENTIAL/PLATELET
BASOS ABS: 0 10*3/uL (ref 0.0–0.2)
Basos: 0 %
EOS (ABSOLUTE): 0.1 10*3/uL (ref 0.0–0.4)
Eos: 2 %
HEMATOCRIT: 43.2 % (ref 37.5–51.0)
HEMOGLOBIN: 14.5 g/dL (ref 13.0–17.7)
Immature Grans (Abs): 0 10*3/uL (ref 0.0–0.1)
Immature Granulocytes: 0 %
LYMPHS ABS: 2.1 10*3/uL (ref 0.7–3.1)
Lymphs: 23 %
MCH: 30.5 pg (ref 26.6–33.0)
MCHC: 33.6 g/dL (ref 31.5–35.7)
MCV: 91 fL (ref 79–97)
MONOCYTES: 10 %
MONOS ABS: 1 10*3/uL — AB (ref 0.1–0.9)
NEUTROS ABS: 6 10*3/uL (ref 1.4–7.0)
Neutrophils: 65 %
Platelets: 216 10*3/uL (ref 150–450)
RBC: 4.76 x10E6/uL (ref 4.14–5.80)
RDW: 12.4 % (ref 11.6–15.4)
WBC: 9.2 10*3/uL (ref 3.4–10.8)

## 2018-05-02 LAB — D-DIMER, QUANTITATIVE: D-DIMER: 2.87 mg/L FEU — ABNORMAL HIGH (ref 0.00–0.49)

## 2018-07-03 DIAGNOSIS — M5412 Radiculopathy, cervical region: Secondary | ICD-10-CM | POA: Diagnosis not present

## 2018-07-31 ENCOUNTER — Other Ambulatory Visit (HOSPITAL_COMMUNITY): Payer: Self-pay | Admitting: Neurosurgery

## 2018-07-31 ENCOUNTER — Other Ambulatory Visit: Payer: Self-pay | Admitting: Neurosurgery

## 2018-07-31 DIAGNOSIS — M51379 Other intervertebral disc degeneration, lumbosacral region without mention of lumbar back pain or lower extremity pain: Secondary | ICD-10-CM | POA: Insufficient documentation

## 2018-07-31 DIAGNOSIS — M5137 Other intervertebral disc degeneration, lumbosacral region: Secondary | ICD-10-CM | POA: Diagnosis not present

## 2018-08-01 ENCOUNTER — Other Ambulatory Visit: Payer: Self-pay | Admitting: Neurosurgery

## 2018-08-01 ENCOUNTER — Other Ambulatory Visit (HOSPITAL_COMMUNITY): Payer: Self-pay | Admitting: Neurosurgery

## 2018-08-01 DIAGNOSIS — M5137 Other intervertebral disc degeneration, lumbosacral region: Secondary | ICD-10-CM

## 2018-08-25 DIAGNOSIS — D2262 Melanocytic nevi of left upper limb, including shoulder: Secondary | ICD-10-CM | POA: Diagnosis not present

## 2018-08-25 DIAGNOSIS — D2271 Melanocytic nevi of right lower limb, including hip: Secondary | ICD-10-CM | POA: Diagnosis not present

## 2018-08-25 DIAGNOSIS — L570 Actinic keratosis: Secondary | ICD-10-CM | POA: Diagnosis not present

## 2018-08-25 DIAGNOSIS — Z85828 Personal history of other malignant neoplasm of skin: Secondary | ICD-10-CM | POA: Diagnosis not present

## 2018-08-25 DIAGNOSIS — D2261 Melanocytic nevi of right upper limb, including shoulder: Secondary | ICD-10-CM | POA: Diagnosis not present

## 2018-08-25 DIAGNOSIS — Z08 Encounter for follow-up examination after completed treatment for malignant neoplasm: Secondary | ICD-10-CM | POA: Diagnosis not present

## 2018-08-25 DIAGNOSIS — D2272 Melanocytic nevi of left lower limb, including hip: Secondary | ICD-10-CM | POA: Diagnosis not present

## 2018-08-25 DIAGNOSIS — D225 Melanocytic nevi of trunk: Secondary | ICD-10-CM | POA: Diagnosis not present

## 2018-08-26 ENCOUNTER — Other Ambulatory Visit: Payer: Self-pay

## 2018-08-26 ENCOUNTER — Ambulatory Visit
Admission: RE | Admit: 2018-08-26 | Discharge: 2018-08-26 | Disposition: A | Discharge status: Home / Self Care | Payer: PPO | Source: Ambulatory Visit | Attending: Neurosurgery | Admitting: Neurosurgery

## 2018-08-26 DIAGNOSIS — M545 Low back pain: Secondary | ICD-10-CM | POA: Diagnosis not present

## 2018-08-26 DIAGNOSIS — M5137 Other intervertebral disc degeneration, lumbosacral region: Secondary | ICD-10-CM | POA: Insufficient documentation

## 2018-09-03 DIAGNOSIS — M5412 Radiculopathy, cervical region: Secondary | ICD-10-CM | POA: Diagnosis not present

## 2018-09-03 DIAGNOSIS — M5137 Other intervertebral disc degeneration, lumbosacral region: Secondary | ICD-10-CM | POA: Diagnosis not present

## 2018-09-08 ENCOUNTER — Other Ambulatory Visit: Payer: Self-pay | Admitting: Family Medicine

## 2018-09-08 DIAGNOSIS — R12 Heartburn: Secondary | ICD-10-CM

## 2018-10-14 ENCOUNTER — Encounter: Payer: Self-pay | Admitting: Family Medicine

## 2018-10-14 ENCOUNTER — Ambulatory Visit (INDEPENDENT_AMBULATORY_CARE_PROVIDER_SITE_OTHER): Payer: PPO | Admitting: Family Medicine

## 2018-10-14 ENCOUNTER — Other Ambulatory Visit: Payer: Self-pay

## 2018-10-14 VITALS — BP 110/60 | HR 67 | Temp 97.3°F | Wt 185.0 lb

## 2018-10-14 DIAGNOSIS — M48061 Spinal stenosis, lumbar region without neurogenic claudication: Secondary | ICD-10-CM | POA: Diagnosis not present

## 2018-10-14 MED ORDER — PREDNISONE 10 MG PO TABS: ORAL_TABLET | ORAL | 0 refills | Status: DC

## 2018-10-14 NOTE — Progress Notes (Signed)
Benjamin Gutierrez  MRN: [id number] DOB: 17-Dec-1950  Subjective:  HPI   The patienti is a 68 year old male who presents with complaint of bilateral leg pain, with the right worse than the left.  He states he is also having right groin pain that may be coming from straining when lifting a large garden pot. He states that he has had history of back pain with buttock pain a few years ago and was treated for sciatica.  He states that this started like it was going to be his sciatica but he reports no back pain. The patient states that he has also been having right testicular pain.  He denies any dysuria or hematuria.   Patient Active Problem List   Diagnosis Date Noted  . Hx of fusion of cervical spine 11/14/2017  . Syncope   . H/O superficial parotidectomy 12/13/2016  . BPH (benign prostatic hyperplasia) 09/10/2016  . Osteoarthrosis, hand 08/06/2014  . Anxiety 08/06/2014  . Blood in feces 08/06/2014  . Acid reflux 08/06/2014  . ED (erectile dysfunction) of organic origin 08/06/2014  . Dizziness and giddiness 08/06/2014  . H/O arthrodesis 08/06/2014  . Decreased libido 08/06/2014  . Avitaminosis D 08/06/2014  . Leg varices 08/06/2014  . 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 LBP (low back pain) 04/05/2006    Past Medical History:  Diagnosis Date  . Acid reflux 08/06/2014  . Anxiety 08/06/2014  . Arthritis   . Blood in feces 08/06/2014  . 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 Decreased libido 08/06/2014  . Dizziness and giddiness 08/06/2014  . ED (erectile dysfunction) of organic origin 08/06/2014  . GERD (gastroesophageal reflux disease)   . H/O arthrodesis 08/06/2014  . Leg varices 08/06/2014    Social History   Socioeconomic History  . Marital status: Married    Spouse name: Bethena Roys  . Number of children: 2  . Years of education: 30  . Highest education level: High school graduate  Occupational History  . Occupation: Retired  Scientific laboratory technician  . Financial  resource strain: Not hard at all  . Food insecurity    Worry: Never true    Inability: Never true  . Transportation needs    Medical: No    Non-medical: No  Tobacco Use  . Smoking status: Never Smoker  . Smokeless tobacco: Never Used  Substance and Sexual Activity  . Alcohol use: Not Currently    Alcohol/week: 0.0 standard drinks    Frequency: Never  . Drug use: No  . Sexual activity: Yes    Birth control/protection: None  Lifestyle  . Physical activity    Days per week: Not on file    Minutes per session: Not on file  . Stress: Not at all  Relationships  . Social Herbalist on phone: Not on file    Gets together: Not on file    Attends religious service: Not on file    Active member of club or organization: Not on file    Attends meetings of clubs or organizations: Not on file    Relationship status: Not on file  . Intimate partner violence    Fear of current or ex partner: Not on file    Emotionally abused: Not on file    Physically abused: Not on file    Forced sexual activity: Not on file  Other Topics Concern  . Not on file  Social History Narrative  . Not on  file    Outpatient Encounter Medications as of 10/14/2018  Medication Sig  . acetaminophen (TYLENOL) 500 MG tablet Take 500 mg by mouth every 6 (six) hours as needed.  Marland Kitchen DEXILANT 60 MG capsule TAKE 1 CAPSULE BY MOUTH ONCE DAILY  . ibuprofen (ADVIL,MOTRIN) 200 MG tablet Take 200 mg by mouth every 6 (six) hours as needed.  . metoprolol tartrate (LOPRESSOR) 25 MG tablet TAKE 1 TABLET BY MOUTH TWICE A DAY  . naproxen (EC NAPROSYN) 500 MG EC tablet Take 500 mg by mouth as needed.   . [DISCONTINUED] bacitracin ointment Apply 1 application topically every 8 (eight) hours.  . [DISCONTINUED] chlorhexidine (PERIDEX) 0.12 % solution Use as directed 15 mLs in the mouth or throat daily.   No facility-administered encounter medications on file as of 10/14/2018.     Allergies  Allergen Reactions  . Betadine  [Povidone Iodine] Itching  . Fentanyl Other (See Comments)    Pain patch and after passed out and admitted to the hospital  . Penicillins Rash    Review of Systems  Constitutional: Negative for fever and malaise/fatigue.  HENT: Negative for congestion, ear pain, sinus pain and sore throat.   Respiratory: Negative for cough, shortness of breath and wheezing.   Cardiovascular: Negative for chest pain.  Genitourinary: Negative for dysuria and hematuria.  Musculoskeletal: Positive for myalgias.    Objective:  BP 110/60 (BP Location: Right Arm, Patient Position: Sitting, Cuff Size: Normal)   Pulse 67   Temp (!) 97.3 F (36.3 C) (Oral)   Wt 185 lb (83.9 kg)   SpO2 97%   BMI 28.13 kg/m   Physical Exam  Constitutional: He is oriented to person, place, and time and well-developed, well-nourished, and in no distress.  HENT:  Head: Normocephalic.  Eyes: Conjunctivae are normal.  Neck: Neck supple.  Cardiovascular: Normal rate and regular rhythm.  Pulmonary/Chest: Effort normal.  Abdominal: Soft. Bowel sounds are normal.  No inguinal hernia on exam.  Musculoskeletal:     Comments: No tenderness to palpate right lumbar or sciatic notch. SLR's 90 degrees without pain. Strength 4/5 both lower extremities. DTR's symmetric both knees. Good pulses without numbness.  Neurological: He is alert and oriented to person, place, and time.  Skin: No rash noted.  Psychiatric: Mood, affect and judgment normal.    Assessment and Plan :   1. Lumbar foraminal stenosis Has had pains in the right calf and right thigh about a week ago that has improved. The past 3 days he has noticed some discomfort in the right groin and buttock. No specific injury noted but remembers carrying a couple of heavy garden pots. No sign of hernia and no tenderness to palpation. Had an MRI of lumbar spine on 08-26-18 by Dr. Annette Stable (neurosurgeon) that showed bilateral foraminal stenosis and right sided osteophytes. Not much relief  from use of Naproxen. Suspect neuropathy and will treat with prednisone taper for 6 days. May need recheck with neurosurgeon or hip x-rays, if no better after this regimen. - predniSONE (DELTASONE) 10 MG tablet; Taper dosage by 1 tablet daily by mouth starting at 6 the first day, 5 day 2, 4 day 3, 3 day 4, 2 day 5 and 1 day 6.  Dispense: 21 tablet; Refill: 0

## 2018-11-08 ENCOUNTER — Other Ambulatory Visit: Payer: Self-pay | Admitting: Family Medicine

## 2018-11-08 DIAGNOSIS — R000 Tachycardia, unspecified: Secondary | ICD-10-CM

## 2018-11-08 DIAGNOSIS — R251 Tremor, unspecified: Secondary | ICD-10-CM

## 2018-11-21 DIAGNOSIS — H0013 Chalazion right eye, unspecified eyelid: Secondary | ICD-10-CM | POA: Diagnosis not present

## 2019-02-02 ENCOUNTER — Other Ambulatory Visit: Payer: Self-pay | Admitting: *Deleted

## 2019-02-02 DIAGNOSIS — Z125 Encounter for screening for malignant neoplasm of prostate: Secondary | ICD-10-CM

## 2019-02-02 NOTE — Progress Notes (Signed)
P 

## 2019-02-03 ENCOUNTER — Other Ambulatory Visit: Payer: PPO

## 2019-02-03 ENCOUNTER — Other Ambulatory Visit: Payer: Self-pay

## 2019-02-03 DIAGNOSIS — Z125 Encounter for screening for malignant neoplasm of prostate: Secondary | ICD-10-CM

## 2019-02-04 LAB — PSA TOTAL (REFLEX TO FREE): Prostate Specific Ag, Serum: 1.1 ng/mL (ref 0.0–4.0)

## 2019-02-05 ENCOUNTER — Ambulatory Visit: Payer: PPO | Admitting: Urology

## 2019-02-05 ENCOUNTER — Other Ambulatory Visit: Payer: Self-pay

## 2019-02-05 ENCOUNTER — Encounter: Payer: Self-pay | Admitting: Urology

## 2019-02-05 VITALS — BP 140/88 | HR 77 | Ht 68.0 in | Wt 185.0 lb

## 2019-02-05 DIAGNOSIS — Z125 Encounter for screening for malignant neoplasm of prostate: Secondary | ICD-10-CM

## 2019-02-05 NOTE — Patient Instructions (Signed)

## 2019-02-05 NOTE — Progress Notes (Signed)
   02/05/2019 3:53 PM   Ardis Rowan Jul 07, 1950 [id number]  Reason for visit: Follow up PSA screening, BPH  HPI: I saw Mr. Szablewski in urology clinic for yearly follow-up.  He is a healthy 68 year old male with a family history of prostate cancer in his brother.  His PSAs have all been very low, and is stable this year 1.1 from 1.0 last year.  Regarding his urinary symptoms, he really has no complaints and has nocturia 1-2 times per night and is not on any medications.  He previously tried Flomax, but had lightheadedness with this and stopped the medication.  He is very satisfied with his symptoms currently.  He previously had tried sildenafil for ED, but he is no longer sexually active at this time.  He denies any flank pain or gross hematuria.  We spent most of her time today reviewing the AUA guidelines regarding PSA screening in men between the ages of 69 and 51.  We discussed the risks and benefits of at length, and the reasoning behind discontinuing screening at age 50.  He is unsure if he is comfortable stopping screening at this point, and he would like to think about it.  With his family history of prostate cancer and his overall good health, it would not be unreasonable to continue yearly PSA screening for a few more years.  RTC 1 year Patient will let us know if he would like to continue yearly PSA screening  A total of 15 minutes were spent face-to-face with the patient, greater than 50% was spent in patient education, counseling, and coordination of care regarding PSA screening and BPH.  [name], [facility] [address], [place], [place] [address] [phone]

## 2019-02-06 ENCOUNTER — Other Ambulatory Visit: Payer: PPO

## 2019-02-07 ENCOUNTER — Other Ambulatory Visit: Payer: Self-pay | Admitting: Family Medicine

## 2019-02-07 DIAGNOSIS — R12 Heartburn: Secondary | ICD-10-CM

## 2019-02-10 ENCOUNTER — Ambulatory Visit: Payer: PPO | Admitting: Urology

## 2019-04-24 ENCOUNTER — Other Ambulatory Visit: Payer: Self-pay

## 2019-04-24 ENCOUNTER — Encounter: Payer: Self-pay | Admitting: Family Medicine

## 2019-04-24 ENCOUNTER — Ambulatory Visit (INDEPENDENT_AMBULATORY_CARE_PROVIDER_SITE_OTHER): Payer: PPO | Admitting: Family Medicine

## 2019-04-24 VITALS — BP 148/83 | HR 92 | Temp 92.0°F | Resp 18 | Ht 68.0 in | Wt 188.0 lb

## 2019-04-24 DIAGNOSIS — N451 Epididymitis: Secondary | ICD-10-CM | POA: Diagnosis not present

## 2019-04-24 NOTE — Progress Notes (Signed)
Patient: Benjamin Gutierrez Male    DOB: [birth year]   69 y.o.   MRN: [id number] Visit Date: 04/24/2019  Today's Provider: Vernie Murders, PA   Chief Complaint  Patient presents with  . Testicle Pain   Subjective:     HPI   Patient states he has been having testicular pain for several week on right side. No swelling that he can tell. No blood in urine.  Past Medical History:  Diagnosis Date  . Acid reflux 08/06/2014  . Anxiety 08/06/2014  . Arthritis   . Blood in feces 08/06/2014  . 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 Decreased libido 08/06/2014  . Dizziness and giddiness 08/06/2014  . ED (erectile dysfunction) of organic origin 08/06/2014  . GERD (gastroesophageal reflux disease)   . H/O arthrodesis 08/06/2014  . Leg varices 08/06/2014   Past Surgical History:  Procedure Laterality Date  . CARDIAC CATHETERIZATION  1993  . CERVICAL SPINE SURGERY  2009   radiculopathy from past fusion and arthritic changes  . COLONOSCOPY  2013  . ESOPHAGOGASTRODUODENOSCOPY ENDOSCOPY    . EYE SURGERY  01/02/2018   right eye lid mole removed  . LAMINECTOMY  2005  . PAROTIDECTOMY Left 12/13/2016   Procedure: TOTAL PAROTIDECTOMY WITH FACIAL NERVE MONITORING;  Surgeon: Carloyn Manner, MD;  Location: [facility] ORS;  Service: ENT;  Laterality: Left;   Family History  Problem Relation Age of Onset  . Esophageal cancer Mother   . Alcohol abuse Father   . Prostate cancer Brother   . Parkinson's disease Maternal Uncle   . Dementia Paternal Grandmother    Allergies  Allergen Reactions  . Betadine [Povidone Iodine] Itching  . Fentanyl Other (See Comments)    Pain patch and after passed out and admitted to the hospital  . Penicillins Rash    Current Outpatient Medications:  .  acetaminophen (TYLENOL) 500 MG tablet, Take 500 mg by mouth every 6 (six) hours as needed., Disp: , Rfl:  .  DEXILANT 60 MG capsule, TAKE 1 CAPSULE BY MOUTH ONCE DAILY, Disp: 90 capsule, Rfl:  1 .  ibuprofen (ADVIL,MOTRIN) 200 MG tablet, Take 200 mg by mouth every 6 (six) hours as needed., Disp: , Rfl:  .  metoprolol tartrate (LOPRESSOR) 25 MG tablet, TAKE 1 TABLET BY MOUTH TWICE A DAY, Disp: 60 tablet, Rfl: 3 .  naproxen (EC NAPROSYN) 500 MG EC tablet, Take 500 mg by mouth as needed. , Disp: , Rfl:   Review of Systems  Constitutional: Negative for appetite change, chills and fever.  Respiratory: Negative for chest tightness, shortness of breath and wheezing.   Cardiovascular: Negative for chest pain and palpitations.  Gastrointestinal: Negative for abdominal pain, nausea and vomiting.   Social History   Tobacco Use  . Smoking status: Never Smoker  . Smokeless tobacco: Never Used  Substance Use Topics  . Alcohol use: Not Currently    Alcohol/week: 0.0 standard drinks     Objective:   BP (!) 148/83 (BP Location: Right Arm, Patient Position: Sitting, Cuff Size: Large)   Pulse 92   Temp (!) 92 F (33.3 C) (Other (Comment))   Resp 18   Ht 5\' 8"  (1.727 m)   Wt 188 lb (85.3 kg)   SpO2 97%   BMI 28.59 kg/m  Vitals:   04/24/19 1445  BP: (!) 148/83  Pulse: 92  Resp: 18  Temp: (!) 92 F (33.3 C)  TempSrc: Other (Comment)  SpO2: 97%  Weight: 188 lb (85.3 kg)  Height: 5\' 8"  (1.727 m)  Body mass index is 28.59 kg/m.  Physical Exam Constitutional:      General: He is not in acute distress.    Appearance: He is well-developed.  HENT:     Head: Normocephalic and atraumatic.     Right Ear: Hearing normal.     Left Ear: Hearing normal.     Nose: Nose normal.  Eyes:     General: Lids are normal. No scleral icterus.       Right eye: No discharge.        Left eye: No discharge.     Conjunctiva/sclera: Conjunctivae normal.  Pulmonary:     Effort: Pulmonary effort is normal. No respiratory distress.  Genitourinary:    Comments: Slight tenderness in spermatic chords at the right testicle superior pole. No masses or local lymphadenopathy. No swelling.  Musculoskeletal:        General: Normal range of motion.  Skin:    Findings: No lesion or rash.  Neurological:     Mental Status: He is alert and oriented to person, place, and time.  Psychiatric:        Speech: Speech normal.        Behavior: Behavior normal.        Thought Content: Thought content normal.       Assessment & Plan    1. Epididymitis, right Onset with right testicular discomfort 3-4 weeks ago. Worsening the past week without fever, dysuria or urethral discharge. No known injury. May use Doxycycline 100 mg qd with ice pack or moist heat applications. May add Aleve prn and recheck prn.     Vernie Murders, PA  [facility]

## 2019-04-27 ENCOUNTER — Encounter: Payer: Self-pay | Admitting: Family Medicine

## 2019-04-27 ENCOUNTER — Other Ambulatory Visit: Payer: Self-pay | Admitting: Family Medicine

## 2019-04-27 DIAGNOSIS — N451 Epididymitis: Secondary | ICD-10-CM

## 2019-04-27 MED ORDER — CIPROFLOXACIN HCL 500 MG PO TABS: 500.0000 mg | ORAL_TABLET | Freq: Two times a day (BID) | ORAL | 0 refills | Status: DC

## 2019-05-18 ENCOUNTER — Other Ambulatory Visit: Payer: Self-pay

## 2019-05-18 ENCOUNTER — Encounter: Payer: Self-pay | Admitting: Urology

## 2019-05-18 ENCOUNTER — Ambulatory Visit: Payer: PPO | Admitting: Urology

## 2019-05-18 VITALS — BP 128/72 | HR 79 | Ht 68.0 in | Wt 185.0 lb

## 2019-05-18 DIAGNOSIS — N400 Enlarged prostate without lower urinary tract symptoms: Secondary | ICD-10-CM | POA: Diagnosis not present

## 2019-05-18 DIAGNOSIS — N5082 Scrotal pain: Secondary | ICD-10-CM

## 2019-05-18 DIAGNOSIS — N453 Epididymo-orchitis: Secondary | ICD-10-CM

## 2019-05-18 LAB — URINALYSIS, COMPLETE
Bilirubin, UA: NEGATIVE
Glucose, UA: NEGATIVE
Ketones, UA: NEGATIVE
Leukocytes,UA: NEGATIVE
Nitrite, UA: NEGATIVE
Protein,UA: NEGATIVE
RBC, UA: NEGATIVE
Specific Gravity, UA: 1.025 (ref 1.005–1.030)
Urobilinogen, Ur: 0.2 mg/dL (ref 0.2–1.0)
pH, UA: 5 (ref 5.0–7.5)

## 2019-05-18 LAB — MICROSCOPIC EXAMINATION
Bacteria, UA: NONE SEEN
Epithelial Cells (non renal): NONE SEEN /hpf (ref 0–10)
RBC, Urine: NONE SEEN /hpf (ref 0–2)

## 2019-05-18 LAB — BLADDER SCAN AMB NON-IMAGING: Scan Result: 57

## 2019-05-18 MED ORDER — CELECOXIB 100 MG PO CAPS: 100.0000 mg | ORAL_CAPSULE | Freq: Every day | ORAL | 0 refills | Status: DC

## 2019-05-18 NOTE — Patient Instructions (Signed)
Epididymitis  Epididymitis is swelling (inflammation) or infection of the epididymis. The epididymis is a cord-like structure that is located along the top and back part of the testicle. It collects and stores sperm from the testicle. This condition can also cause pain and swelling of the testicle and scrotum. Symptoms usually start suddenly (acute epididymitis). Sometimes epididymitis starts gradually and lasts for a while (chronic epididymitis). This type may be harder to treat. What are the causes? In men ages 31-40, this condition is usually caused by a bacterial infection or a sexually transmitted disease (STD), such as:  Gonorrhea.  Chlamydia. In men 87 and older who do not have anal sex, this condition is usually caused by bacteria from a blockage or from abnormalities in the urinary system. These can result from:  Having a tube placed into the bladder (urinary catheter).  Having an enlarged or inflamed prostate gland.  Having recently had urinary tract surgery.  Having a problem with a backward flow of urine (retrograde). In men who have a condition that weakens the body's defense system (immune system), such as HIV, this condition can be caused by:  Other bacteria, including tuberculosis and syphilis.  Viruses.  Fungi. Sometimes this condition occurs without infection. This may happen because of trauma or repetitive activities such as sports. What increases the risk? You are more likely to develop this condition if you have:  Unprotected sex with more than one partner.  Anal sex.  Recently had surgery.  A urinary catheter.  Urinary problems.  A suppressed immune system. What are the signs or symptoms? This condition usually begins suddenly with chills, fever, and pain behind the scrotum and in the testicle. Other symptoms include:  Swelling of the scrotum, testicle, or both.  Pain when ejaculating or urinating.  Pain in the back or abdomen.  Nausea.   Itching and discharge from the penis.  A frequent need to pass urine.  Redness, increased warmth, and tenderness of the scrotum. How is this diagnosed? Your health care provider can diagnose this condition based on your symptoms and medical history. Your health care provider will also do a physical exam to ask about your symptoms and check your scrotum and testicle for swelling, pain, and redness. You may also have other tests, including:  Examination of discharge from the penis.  Urine tests for infections, such as STDs.  Ultrasound test for blood flow and inflammation. Your health care provider may test you for other STDs, including HIV. How is this treated? Treatment for this condition depends on the cause. If your condition is caused by a bacterial infection, oral antibiotic medicine may be prescribed. If the bacterial infection has spread to your blood, you may need to receive IV antibiotics. For both bacterial and nonbacterial epididymitis, you may be treated with:  Rest.  Elevation of the scrotum.  Pain medicines.  Anti-inflammatory medicines. Surgery may be needed to treat:  Bacterial epididymitis that causes pus to build up in the scrotum (abscess).  Chronic epididymitis that has not responded to other treatments. Follow these instructions at home: Medicines  Take over-the-counter and prescription medicines only as told by your health care provider.  If you were prescribed an antibiotic medicine, take it as told by your health care provider. Do not stop taking the antibiotic even if your condition improves. Sexual activity  If your epididymitis was caused by an STD, avoid sexual activity until your treatment is complete.  Inform your sexual partner or partners if you test positive for  an STD. They may need to be treated. Do not engage in sexual activity with your partner or partners until their treatment is completed. Managing pain and swelling   If directed,  elevate your scrotum and apply ice. ? Put ice in a plastic bag. ? Place a small towel or pillow between your legs. ? Rest your scrotum on the pillow or towel. ? Place another towel between your skin and the plastic bag. ? Leave the ice on for 20 minutes, 2-3 times a day.  Try taking a sitz bath to help with discomfort. This is a warm water bath that is taken while you are sitting down. The water should only come up to your hips and should cover your buttocks. Do this 3-4 times per day or as told by your health care provider.  Keep your scrotum elevated and supported while resting. Ask your health care provider if you should wear a scrotal support, such as a jockstrap. Wear it as told by your health care provider. General instructions  Return to your normal activities as told by your health care provider. Ask your health care provider what activities are safe for you.  Drink enough fluid to keep your urine pale yellow.  Keep all follow-up visits as told by your health care provider. This is important. Contact a health care provider if:  You have a fever.  Your pain medicine is not helping.  Your pain is getting worse.  Your symptoms do not improve within 3 days. Summary  Epididymitis is swelling (inflammation) or infection of the epididymis. This condition can also cause pain and swelling of the testicle and scrotum.  Treatment for this condition depends on the cause. If your condition is caused by a bacterial infection, oral antibiotic medicine may be prescribed.  Inform your sexual partner or partners if you test positive for an STD. They may need to be treated. Do not engage in sexual activity with your partner or partners until their treatment is completed.  Contact a health care provider if your symptoms do not improve within 3 days. This information is not intended to replace advice given to you by your health care provider. Make sure you discuss any questions you have with  your health care provider. Document Revised: 12/09/2017 Document Reviewed: 12/10/2017 Elsevier Patient Education  2020 [employer].   Benign Prostatic Hyperplasia  Benign prostatic hyperplasia (BPH) is an enlarged prostate gland that is caused by the normal aging process and not by cancer. The prostate is a walnut-sized gland that is involved in the production of semen. It is located in front of the rectum and below the bladder. The bladder stores urine and the urethra is the tube that carries the urine out of the body. The prostate may get bigger as a man gets older. An enlarged prostate can press on the urethra. This can make it harder to pass urine. The build-up of urine in the bladder can cause infection. Back pressure and infection may progress to bladder damage and kidney (renal) failure. What are the causes? This condition is part of a normal aging process. However, not all men develop problems from this condition. If the prostate enlarges away from the urethra, urine flow will not be blocked. If it enlarges toward the urethra and compresses it, there will be problems passing urine. What increases the risk? This condition is more likely to develop in men over the age of 10 years. What are the signs or symptoms? Symptoms of this condition  include:  Getting up often during the night to urinate.  Needing to urinate frequently during the day.  Difficulty starting urine flow.  Decrease in size and strength of your urine stream.  Leaking (dribbling) after urinating.  Inability to pass urine. This needs immediate treatment.  Inability to completely empty your bladder.  Pain when you pass urine. This is more common if there is also an infection.  Urinary tract infection (UTI). How is this diagnosed? This condition is diagnosed based on your medical history, a physical exam, and your symptoms. Tests will also be done, such as:  A post-void bladder scan. This measures any amount of  urine that may remain in your bladder after you finish urinating.  A digital rectal exam. In a rectal exam, your health care provider checks your prostate by putting a lubricated, gloved finger into your rectum to feel the back of your prostate gland. This exam detects the size of your gland and any abnormal lumps or growths.  An exam of your urine (urinalysis).  A prostate specific antigen (PSA) screening. This is a blood test used to screen for prostate cancer.  An ultrasound. This test uses sound waves to electronically produce a picture of your prostate gland. Your health care provider may refer you to a specialist in kidney and prostate diseases (urologist). How is this treated? Once symptoms begin, your health care provider will monitor your condition (active surveillance or watchful waiting). Treatment for this condition will depend on the severity of your condition. Treatment may include:  Observation and yearly exams. This may be the only treatment needed if your condition and symptoms are mild.  Medicines to relieve your symptoms, including: ? Medicines to shrink the prostate. ? Medicines to relax the muscle of the prostate.  Surgery in severe cases. Surgery may include: ? Prostatectomy. In this procedure, the prostate tissue is removed completely through an open incision or with a laparoscope or robotics. ? Transurethral resection of the prostate (TURP). In this procedure, a tool is inserted through the opening at the tip of the penis (urethra). It is used to cut away tissue of the inner core of the prostate. The pieces are removed through the same opening of the penis. This removes the blockage. ? Transurethral incision (TUIP). In this procedure, small cuts are made in the prostate. This lessens the prostate's pressure on the urethra. ? Transurethral microwave thermotherapy (TUMT). This procedure uses microwaves to create heat. The heat destroys and removes a small amount of  prostate tissue. ? Transurethral needle ablation (TUNA). This procedure uses radio frequencies to destroy and remove a small amount of prostate tissue. ? Interstitial laser coagulation ([place]). This procedure uses a laser to destroy and remove a small amount of prostate tissue. ? Transurethral electrovaporization (TUVP). This procedure uses electrodes to destroy and remove a small amount of prostate tissue. ? Prostatic urethral lift. This procedure inserts an implant to push the lobes of the prostate away from the urethra. Follow these instructions at home:  Take over-the-counter and prescription medicines only as told by your health care provider.  Monitor your symptoms for any changes. Contact your health care provider with any changes.  Avoid drinking large amounts of liquid before going to bed or out in public.  Avoid or reduce how much caffeine or alcohol you drink.  Give yourself time when you urinate.  Keep all follow-up visits as told by your health care provider. This is important. Contact a health care provider if:  You  have unexplained back pain.  Your symptoms do not get better with treatment.  You develop side effects from the medicine you are taking.  Your urine becomes very dark or has a bad smell.  Your lower abdomen becomes distended and you have trouble passing your urine. Get help right away if:  You have a fever or chills.  You suddenly cannot urinate.  You feel lightheaded, or very dizzy, or you faint.  There are large amounts of blood or clots in the urine.  Your urinary problems become hard to manage.  You develop moderate to severe low back or flank pain. The flank is the side of your body between the ribs and the hip. These symptoms may represent a serious problem that is an emergency. Do not wait to see if the symptoms will go away. Get medical help right away. Call your local emergency services (911 in the [place] Do not drive yourself to the  hospital. Summary  Benign prostatic hyperplasia (BPH) is an enlarged prostate that is caused by the normal aging process and not by cancer.  An enlarged prostate can press on the urethra. This can make it hard to pass urine.  This condition is part of a normal aging process and is more likely to develop in men over the age of 20 years.  Get help right away if you suddenly cannot urinate. This information is not intended to replace advice given to you by your health care provider. Make sure you discuss any questions you have with your health care provider. Document Revised: 12/31/2017 Document Reviewed: 03/12/2016 Elsevier Patient Education  2020 [employer].

## 2019-05-18 NOTE — Progress Notes (Signed)
   05/18/2019 3:01 PM   Ardis Rowan 09/22/1950 [id number]  Reason for visit: Right scrotal pain, BPH/urinary symptoms  HPI: I saw Mr. Toutant in urology clinic today for evaluation of right scrotal pain, as well as BPH and urinary symptoms.  He is a 69 year old relatively healthy male who I followed for routine PSA screening, as well as BPH with urinary symptoms of weak stream and nocturia.  He previously was on Flomax, however had lightheadedness and discontinued this medication.  He was recently seen by his PCP 3 weeks ago with acute onset of right-sided scrotal pain and diagnosed with epididymitis and treated with Cipro.  It does not appear a urinalysis was sent at this visit.  He has had moderate improvement in his symptoms with the 2 weeks of Cipro, but he admits he has not been taking it regularly and often only takes 1 a day.  He continues to have some irritative urinary symptoms of nocturia 3-4 times overnight, as well as weak stream and intermittency.  He denies any fevers or chills.  He had a similar episode in 2016 that resolved with Cipro antibiotics.  Urinalysis today benign with 0-5 WBCs, 0 RBCs, no bacteria, nitrite negative, PVR 15 mL.  On exam, the right testicle is 20 cc and descended, moderately tender, no significant scrotal edema and no erythema or induration.  Meatus widely patent.  -Celebrex x14 days for residual pain secondary to likely epididymitis -Scrotal ultrasound, call with results -RTC 6 months with PVR/IPSS -If persistent bothersome urinary symptoms or recurrent infections, consider TRUS/cystoscopy and evaluation of possible outlet procedure with HoLEP   I spent 30 total minutes on the day of the encounter including pre-visit review of the medical record, face-to-face time with the patient, and post visit ordering of labs/imaging/tests.  [name], [facility] [address] [place], [place] [address] [phone]

## 2019-05-22 ENCOUNTER — Ambulatory Visit
Admission: RE | Admit: 2019-05-22 | Discharge: 2019-05-22 | Disposition: A | Discharge status: Home / Self Care | Payer: PPO | Source: Ambulatory Visit | Attending: Urology | Admitting: Urology

## 2019-05-22 ENCOUNTER — Other Ambulatory Visit: Payer: Self-pay

## 2019-05-22 DIAGNOSIS — N433 Hydrocele, unspecified: Secondary | ICD-10-CM | POA: Diagnosis not present

## 2019-05-22 DIAGNOSIS — N5082 Scrotal pain: Secondary | ICD-10-CM | POA: Insufficient documentation

## 2019-05-22 DIAGNOSIS — I861 Scrotal varices: Secondary | ICD-10-CM | POA: Diagnosis not present

## 2019-05-25 ENCOUNTER — Telehealth: Payer: Self-pay | Admitting: *Deleted

## 2019-05-25 NOTE — Telephone Encounter (Addendum)
Patient informed, voiced understanding.   ----- Message from [name], MD sent at 05/22/2019  3:52 PM EDT ----- Normal scrotal US, no explanation for his scrotal pain seen, and no evidence of testicular cancer.  Nickolas Madrid, MD 05/22/2019

## 2019-09-10 DIAGNOSIS — L570 Actinic keratosis: Secondary | ICD-10-CM | POA: Diagnosis not present

## 2019-09-10 DIAGNOSIS — Z85828 Personal history of other malignant neoplasm of skin: Secondary | ICD-10-CM | POA: Diagnosis not present

## 2019-09-10 DIAGNOSIS — D2261 Melanocytic nevi of right upper limb, including shoulder: Secondary | ICD-10-CM | POA: Diagnosis not present

## 2019-09-10 DIAGNOSIS — D2272 Melanocytic nevi of left lower limb, including hip: Secondary | ICD-10-CM | POA: Diagnosis not present

## 2019-09-10 DIAGNOSIS — X32XXXA Exposure to sunlight, initial encounter: Secondary | ICD-10-CM | POA: Diagnosis not present

## 2019-09-10 DIAGNOSIS — D2262 Melanocytic nevi of left upper limb, including shoulder: Secondary | ICD-10-CM | POA: Diagnosis not present

## 2019-09-10 DIAGNOSIS — L821 Other seborrheic keratosis: Secondary | ICD-10-CM | POA: Diagnosis not present

## 2019-09-10 DIAGNOSIS — D225 Melanocytic nevi of trunk: Secondary | ICD-10-CM | POA: Diagnosis not present

## 2019-09-15 ENCOUNTER — Other Ambulatory Visit: Payer: Self-pay

## 2019-11-02 ENCOUNTER — Other Ambulatory Visit: Payer: Self-pay | Admitting: Otolaryngology

## 2019-11-02 DIAGNOSIS — H9202 Otalgia, left ear: Secondary | ICD-10-CM

## 2019-11-02 DIAGNOSIS — D110 Benign neoplasm of parotid gland: Secondary | ICD-10-CM | POA: Diagnosis not present

## 2019-11-02 DIAGNOSIS — H903 Sensorineural hearing loss, bilateral: Secondary | ICD-10-CM | POA: Diagnosis not present

## 2019-11-03 ENCOUNTER — Ambulatory Visit: Payer: PPO | Admitting: Family Medicine

## 2019-11-03 ENCOUNTER — Telehealth: Payer: PPO | Admitting: Physician Assistant

## 2019-11-11 ENCOUNTER — Other Ambulatory Visit: Payer: Self-pay

## 2019-11-11 ENCOUNTER — Ambulatory Visit
Admission: RE | Admit: 2019-11-11 | Discharge: 2019-11-11 | Disposition: A | Discharge status: Home / Self Care | Payer: PPO | Source: Ambulatory Visit | Attending: Otolaryngology | Admitting: Otolaryngology

## 2019-11-11 DIAGNOSIS — Z981 Arthrodesis status: Secondary | ICD-10-CM | POA: Diagnosis not present

## 2019-11-11 DIAGNOSIS — K118 Other diseases of salivary glands: Secondary | ICD-10-CM | POA: Diagnosis not present

## 2019-11-11 DIAGNOSIS — H9202 Otalgia, left ear: Secondary | ICD-10-CM | POA: Insufficient documentation

## 2019-11-11 LAB — POCT I-STAT CREATININE: Creatinine, Ser: 1.1 mg/dL (ref 0.61–1.24)

## 2019-11-11 MED ORDER — IOHEXOL 300 MG/ML  SOLN
75.0000 mL | Freq: Once | INTRAMUSCULAR | Status: AC | PRN
  Administered 2019-11-11: 75 mL via INTRAVENOUS

## 2019-11-19 DIAGNOSIS — M5412 Radiculopathy, cervical region: Secondary | ICD-10-CM | POA: Insufficient documentation

## 2019-11-25 ENCOUNTER — Ambulatory Visit: Payer: Self-pay | Admitting: Urology

## 2019-12-09 ENCOUNTER — Encounter: Payer: Self-pay | Admitting: Urology

## 2019-12-09 ENCOUNTER — Other Ambulatory Visit: Payer: Self-pay

## 2019-12-09 ENCOUNTER — Ambulatory Visit: Payer: PPO | Admitting: Urology

## 2019-12-09 VITALS — BP 123/71 | HR 85 | Ht 68.0 in | Wt 184.4 lb

## 2019-12-09 DIAGNOSIS — N390 Urinary tract infection, site not specified: Secondary | ICD-10-CM

## 2019-12-09 DIAGNOSIS — N400 Enlarged prostate without lower urinary tract symptoms: Secondary | ICD-10-CM | POA: Diagnosis not present

## 2019-12-09 DIAGNOSIS — Z125 Encounter for screening for malignant neoplasm of prostate: Secondary | ICD-10-CM

## 2019-12-09 LAB — BLADDER SCAN AMB NON-IMAGING

## 2019-12-09 NOTE — Patient Instructions (Signed)
Prostate Cancer Screening  Prostate cancer screening is a test that is done to check for the presence of prostate cancer in men. The prostate gland is a walnut-sized gland that is located below the bladder and in front of the rectum in males. The function of the prostate is to add fluid to semen during ejaculation. Prostate cancer is the second most common type of cancer in men. Who should have prostate cancer screening?  Screening recommendations vary based on age and other risk factors. Screening is recommended if:  You are older than age 55. If you are age 55-69, talk with your health care provider about your need for screening and how often screening should be done. Because most prostate cancers are slow growing and will not cause death, screening is generally reserved in this age group for men who have a 10-15-year life expectancy.  You are younger than age 55, and you have these risk factors: ? Being a black male or a male of African descent. ? Having a father, brother, or uncle who has been diagnosed with prostate cancer. The risk is higher if your family member's cancer occurred at an early age. Screening is not recommended if:  You are younger than age 40.  You are between the ages of 40 and 54 and you have no risk factors.  You are 70 years of age or older. At this age, the risks that screening can cause are greater than the benefits that it may provide. If you are at high risk for prostate cancer, your health care provider may recommend that you have screenings more often or that you start screening at a younger age. How is screening for prostate cancer done? The recommended prostate cancer screening test is a blood test called the prostate-specific antigen (PSA) test. PSA is a protein that is made in the prostate. As you age, your prostate naturally produces more PSA. Abnormally high PSA levels may be caused by:  Prostate cancer.  An enlarged prostate that is not caused by cancer  (benign prostatic hyperplasia, BPH). This condition is very common in older men.  A prostate gland infection (prostatitis). Depending on the PSA results, you may need more tests, such as:  A physical exam to check the size of your prostate gland.  Blood and imaging tests.  A procedure to remove tissue samples from your prostate gland for testing (biopsy). What are the benefits of prostate cancer screening?  Screening can help to identify cancer at an early stage, before symptoms start and when the cancer can be treated more easily.  There is a small chance that screening may lower your risk of dying from prostate cancer. The chance is small because prostate cancer is a slow-growing cancer, and most men with prostate cancer die from a different cause. What are the risks of prostate cancer screening? The main risk of prostate cancer screening is diagnosing and treating prostate cancer that would never have caused any symptoms or problems. This is called overdiagnosisand overtreatment. PSA screening cannot tell you if your PSA is high due to cancer or a different cause. A prostate biopsy is the only procedure to diagnose prostate cancer. Even the results of a biopsy may not tell you if your cancer needs to be treated. Slow-growing prostate cancer may not need any treatment other than monitoring, so diagnosing and treating it may cause unnecessary stress or other side effects. A prostate biopsy may also cause:  Infection or fever.  A false negative. This is   a result that shows that you do not have prostate cancer when you actually do have prostate cancer. Questions to ask your health care provider  When should I start prostate cancer screening?  What is my risk for prostate cancer?  How often do I need screening?  What type of screening tests do I need?  How do I get my test results?  What do my results mean?  Do I need treatment? Where to find more information  The American Cancer  Society: www.cancer.org  American Urological Association: www.auanet.org Contact a health care provider if:  You have difficulty urinating.  You have pain when you urinate or ejaculate.  You have blood in your urine or semen.  You have pain in your back or in the area of your prostate. Summary  Prostate cancer is a common type of cancer in men. The prostate gland is located below the bladder and in front of the rectum. This gland adds fluid to semen during ejaculation.  Prostate cancer screening may identify cancer at an early stage, when the cancer can be treated more easily.  The prostate-specific antigen (PSA) test is the recommended screening test for prostate cancer.  Discuss the risks and benefits of prostate cancer screening with your health care provider. If you are age 70 or older, the risks that screening can cause are greater than the benefits that it may provide. This information is not intended to replace advice given to you by your health care provider. Make sure you discuss any questions you have with your health care provider. Document Revised: 09/18/2018 Document Reviewed: 09/18/2018 Elsevier Patient Education  2020 Else[employer] Inc.  

## 2019-12-09 NOTE — Progress Notes (Signed)
   12/09/2019 2:17 PM   Benjamin Gutierrez 29-May-1950 [id number]  Reason for visit: Follow up BPH, recurrent UTI, PSA screening  HPI: Briefly he is a relatively healthy 69 year old male with a family history of prostate cancer in his brother, who we have been following for the above issues.  In terms of his PSA screening, PSA has been stable at ~1 for the last few years, and was most recently 1.1 in December 2020.  We previously reviewed the AUA guidelines regarding recommendations against routine screening in men over age 76, however with his family history and otherwise good health he would like to continue PSA screening every 1 to 2 years at least for another few years.  Regarding his urinary symptoms he is doing very well and denies any significant urinary complaints today aside from nocturia 2 times per night.  IPSS score today is 2, with quality of life mostly satisfied, and PVR is normal at 62 mL today.  He previously was trialed on Flomax in the past and had severe lightheadedness and discontinued this medication.  He has had 2 episodes of epididymitis in the last 5 years, and we discussed that if he were to develop recurrent UTIs we may need to consider outlet procedures in the future.  RTC 1 year with PSA, PVR, IPSS  [name], MD  [facility] [address], [place], [place] [address] [phone]

## 2019-12-17 DIAGNOSIS — M5413 Radiculopathy, cervicothoracic region: Secondary | ICD-10-CM | POA: Diagnosis not present

## 2019-12-21 DIAGNOSIS — M47812 Spondylosis without myelopathy or radiculopathy, cervical region: Secondary | ICD-10-CM | POA: Insufficient documentation

## 2019-12-21 DIAGNOSIS — M5413 Radiculopathy, cervicothoracic region: Secondary | ICD-10-CM | POA: Diagnosis not present

## 2019-12-24 DIAGNOSIS — M5413 Radiculopathy, cervicothoracic region: Secondary | ICD-10-CM | POA: Diagnosis not present

## 2020-01-07 DIAGNOSIS — M5413 Radiculopathy, cervicothoracic region: Secondary | ICD-10-CM | POA: Diagnosis not present

## 2020-01-11 DIAGNOSIS — M5413 Radiculopathy, cervicothoracic region: Secondary | ICD-10-CM | POA: Diagnosis not present

## 2020-01-20 DIAGNOSIS — M5413 Radiculopathy, cervicothoracic region: Secondary | ICD-10-CM | POA: Diagnosis not present

## 2020-01-25 DIAGNOSIS — M5413 Radiculopathy, cervicothoracic region: Secondary | ICD-10-CM | POA: Diagnosis not present

## 2020-01-27 DIAGNOSIS — M5413 Radiculopathy, cervicothoracic region: Secondary | ICD-10-CM | POA: Diagnosis not present

## 2020-02-02 ENCOUNTER — Telehealth: Payer: Self-pay | Admitting: Family Medicine

## 2020-02-02 DIAGNOSIS — M5413 Radiculopathy, cervicothoracic region: Secondary | ICD-10-CM | POA: Diagnosis not present

## 2020-02-02 NOTE — Telephone Encounter (Signed)
Copied from [place] [id number]. Topic: Medicare AWV >> Feb 02, 2020 12:47 PM Cher Nakai R wrote: Reason for CRM:  Left message for patient to call back and schedule Medicare Annual Wellness Visit (AWV) in office.   If not able to come in office, please offer to do virtually.   Last AWV 10/18/2017  Please schedule at anytime with [facility] Health Advisor.  If any questions, please contact me at [phone]

## 2020-02-04 DIAGNOSIS — M5413 Radiculopathy, cervicothoracic region: Secondary | ICD-10-CM | POA: Diagnosis not present

## 2020-02-08 ENCOUNTER — Ambulatory Visit: Payer: PPO | Admitting: Urology

## 2020-02-08 DIAGNOSIS — M5413 Radiculopathy, cervicothoracic region: Secondary | ICD-10-CM | POA: Diagnosis not present

## 2020-02-10 DIAGNOSIS — M5413 Radiculopathy, cervicothoracic region: Secondary | ICD-10-CM | POA: Diagnosis not present

## 2020-02-25 ENCOUNTER — Ambulatory Visit (INDEPENDENT_AMBULATORY_CARE_PROVIDER_SITE_OTHER): Payer: PPO | Admitting: Family Medicine

## 2020-02-25 ENCOUNTER — Other Ambulatory Visit: Payer: Self-pay

## 2020-02-25 VITALS — BP 136/79 | HR 65 | Temp 98.1°F | Wt 189.0 lb

## 2020-02-25 DIAGNOSIS — R3912 Poor urinary stream: Secondary | ICD-10-CM

## 2020-02-25 DIAGNOSIS — M19042 Primary osteoarthritis, left hand: Secondary | ICD-10-CM | POA: Diagnosis not present

## 2020-02-25 DIAGNOSIS — Z981 Arthrodesis status: Secondary | ICD-10-CM

## 2020-02-25 DIAGNOSIS — R12 Heartburn: Secondary | ICD-10-CM | POA: Diagnosis not present

## 2020-02-25 DIAGNOSIS — Z0000 Encounter for general adult medical examination without abnormal findings: Secondary | ICD-10-CM

## 2020-02-25 DIAGNOSIS — G250 Essential tremor: Secondary | ICD-10-CM

## 2020-02-25 DIAGNOSIS — N401 Enlarged prostate with lower urinary tract symptoms: Secondary | ICD-10-CM | POA: Diagnosis not present

## 2020-02-25 DIAGNOSIS — M19041 Primary osteoarthritis, right hand: Secondary | ICD-10-CM | POA: Diagnosis not present

## 2020-02-25 NOTE — Progress Notes (Signed)
Complete physical exam   Patient: Benjamin Gutierrez   DOB: Sep 11, 1950   70 y.o. Male  MRN: [id number] Visit Date: 02/25/2020  Today's healthcare provider: Dortha Kern, PA-C   No chief complaint on file.  Subjective    Benjamin Gutierrez is a 70 y.o. male who presents today for a complete physical exam.  He reports consuming a general diet.  Exercises 3 days a week at the hospital.  He generally feels well. He reports sleeping well. He does not have additional problems to discuss today.     Past Medical History:  Diagnosis Date   Acid reflux 08/06/2014   Anxiety 08/06/2014   Arthritis    Blood in feces 08/06/2014   Brachial neuritis 01/02/2008   Cancer (HCC)    forehead   Decreased libido 08/06/2014   Dizziness and giddiness 08/06/2014   ED (erectile dysfunction) of organic origin 08/06/2014   GERD (gastroesophageal reflux disease)    H/O arthrodesis 08/06/2014   Leg varices 08/06/2014   Past Surgical History:  Procedure Laterality Date   CARDIAC CATHETERIZATION  1993   CERVICAL SPINE SURGERY  2009   radiculopathy from past fusion and arthritic changes   COLONOSCOPY  2013   ESOPHAGOGASTRODUODENOSCOPY ENDOSCOPY     EYE SURGERY  01/02/2018   right eye lid mole removed   LAMINECTOMY  2005   PAROTIDECTOMY Left 12/13/2016   Procedure: TOTAL PAROTIDECTOMY WITH FACIAL NERVE MONITORING;  Surgeon: Bud Face, MD;  Location: [facility] ORS;  Service: ENT;  Laterality: Left;   Social History   Socioeconomic History   Marital status: Married    Spouse name: Darel Hong   Number of children: 2   Years of education: 12   Highest education level: High school graduate  Occupational History   Occupation: Retired  Tobacco Use   Smoking status: Never Smoker   Smokeless tobacco: Never Used  Building services engineer Use: Never used  Substance and Sexual Activity   Alcohol use: Not Currently    Alcohol/week: 0.0 standard drinks   Drug use: No   Sexual activity: Yes    Birth  control/protection: None  Other Topics Concern   Not on file  Social History Narrative   Not on file   Social Determinants of Health   Financial Resource Strain: Not on file  Food Insecurity: Not on file  Transportation Needs: Not on file  Physical Activity: Not on file  Stress: Not on file  Social Connections: Not on file  Intimate Partner Violence: Not on file   Family Status  Relation Name Status   Mother  Deceased   Father  Deceased   Brother  Alive   Daughter  Alive   Son  Alive   Mat Uncle  Deceased   PGM  Deceased   Family History  Problem Relation Age of Onset   Esophageal cancer Mother    Alcohol abuse Father    Prostate cancer Brother    Parkinson's disease Maternal Uncle    Dementia Paternal Grandmother    Allergies  Allergen Reactions   Betadine [Povidone Iodine] Itching   Fentanyl Other (See Comments)    Pain patch and after passed out and admitted to the hospital   Penicillins Rash    Patient Care Team: Sabah Zucco, Maryjean Morn as PCP - General (Physician Assistant) Imagene Riches, MD as Referring Physician (Ophthalmology) Hildred Laser, MD as Consulting Physician (Urology)   Medications: Outpatient Medications Prior to Visit  Medication  Sig   acetaminophen (TYLENOL) 500 MG tablet Take 500 mg by mouth every 6 (six) hours as needed.   DEXILANT 60 MG capsule TAKE 1 CAPSULE BY MOUTH ONCE DAILY   ibuprofen (ADVIL,MOTRIN) 200 MG tablet Take 200 mg by mouth every 6 (six) hours as needed.   metoprolol tartrate (LOPRESSOR) 25 MG tablet TAKE 1 TABLET BY MOUTH TWICE A DAY   naproxen (EC NAPROSYN) 500 MG EC tablet Take 500 mg by mouth as needed.    celecoxib (CELEBREX) 100 MG capsule Take 1 capsule (100 mg total) by mouth daily.   [DISCONTINUED] ciprofloxacin (CIPRO) 500 MG tablet Take 1 tablet (500 mg total) by mouth every 12 (twelve) hours. (Patient not taking: Reported on 12/09/2019)   No facility-administered medications prior to visit.     Review of Systems  Constitutional: Negative.   HENT: Negative.   Eyes: Negative.   Respiratory: Negative.   Cardiovascular: Negative.   Gastrointestinal: Negative.   Endocrine: Negative.   Genitourinary: Negative.   Musculoskeletal: Negative.   Skin: Negative.   Allergic/Immunologic: Negative.   Neurological: Negative.   Hematological: Negative.   Psychiatric/Behavioral: Negative.        Objective    BP 136/79 (BP Location: Right Arm, Patient Position: Sitting, Cuff Size: Normal)   Pulse 65   Temp 98.1 F (36.7 C) (Oral)   Wt 189 lb (85.7 kg)   SpO2 97%   BMI 28.74 kg/m     Physical Exam Constitutional:      Appearance: Normal appearance. He is normal weight.  HENT:     Head: Normocephalic and atraumatic.     Right Ear: Tympanic membrane, ear canal and external ear normal.     Left Ear: Tympanic membrane, ear canal and external ear normal.     Nose: Nose normal.     Mouth/Throat:     Mouth: Mucous membranes are moist.     Pharynx: Oropharynx is clear.  Eyes:     Extraocular Movements: Extraocular movements intact.     Conjunctiva/sclera: Conjunctivae normal.     Pupils: Pupils are equal, round, and reactive to light.  Cardiovascular:     Rate and Rhythm: Normal rate and regular rhythm.     Pulses: Normal pulses.     Heart sounds: Normal heart sounds.  Pulmonary:     Effort: Pulmonary effort is normal.     Breath sounds: Normal breath sounds.  Abdominal:     General: Abdomen is flat. Bowel sounds are normal.     Palpations: Abdomen is soft.  Musculoskeletal:        General: Normal range of motion.     Cervical back: Normal range of motion and neck supple.  Skin:    General: Skin is warm and dry.  Neurological:     General: No focal deficit present.     Mental Status: He is alert and oriented to person, place, and time. Mental status is at baseline.  Psychiatric:        Mood and Affect: Mood normal.        Behavior: Behavior normal.        Thought  Content: Thought content normal.        Judgment: Judgment normal.      Last depression screening scores PHQ 2/9 Scores 02/25/2020 01/14/2018 11/14/2017  PHQ - 2 Score 0 0 0  PHQ- 9 Score - - 0   Last fall risk screening Fall Risk  02/25/2020  Falls in the past year? 0  Comment -  Number falls in past yr: 0  Injury with Fall? 0   Last Audit-C alcohol use screening Alcohol Use Disorder Test (AUDIT) 02/25/2020  1. How often do you have a drink containing alcohol? 0  2. How many drinks containing alcohol do you have on a typical day when you are drinking? 0  3. How often do you have six or more drinks on one occasion? 0  AUDIT-C Score 0  Alcohol Brief Interventions/Follow-up AUDIT Score <7 follow-up not indicated   A score of 3 or more in women, and 4 or more in men indicates increased risk for alcohol abuse, EXCEPT if all of the points are from question 1   No results found for any visits on 02/25/20.  Assessment & Plan    Routine Health Maintenance and Physical Exam  Exercise Activities and Dietary recommendations Goals   Continue regular exercise by walking 3-4 days a week.     Immunization History  Administered Date(s) Administered   H1N1 01/02/2008   Influenza, High Dose Seasonal PF 11/14/2017   Influenza-Unspecified 11/22/2015, 12/04/2016   Pneumococcal Conjugate-13 05/28/2014   Pneumococcal Polysaccharide-23 11/22/2015   Td 11/22/2004   Tdap 10/10/2010   Zoster 09/01/2010, 12/25/2012   Zoster Recombinat (Shingrix) 08/28/2017    Health Maintenance  Topic Date Due   COVID-19 Vaccine (1) Never done   INFLUENZA VACCINE  09/20/2019   TETANUS/TDAP  10/09/2020   COLONOSCOPY (Pts 45-68yrs Insurance coverage will need to be confirmed)  06/17/2024   Hepatitis C Screening  Completed   PNA vac Low Risk Adult  Completed    Discussed health benefits of physical activity, and encouraged him to engage in regular exercise appropriate for his age and condition.  1. Annual  physical exam  - CBC with Differential/Platelet - Comprehensive metabolic panel - Lipid panel - TSH  2. Hx of fusion of cervical spine   3. Chronic heartburn  - CBC with Differential/Platelet  4. Benign prostatic hyperplasia with weak urinary stream   5. Osteoarthritis of both hands, unspecified osteoarthritis type  - CBC with Differential/Platelet  6. Benign familial tremor  - CBC with Differential/Platelet - Comprehensive metabolic panel - TSH    No follow-ups on file.     I, Yeng Perz, PA-C, have reviewed all documentation for this visit. The documentation on 02/25/20 for the exam, diagnosis, procedures, and orders are all accurate and complete.  Am the supervising physician for Fannie Knee, PA and I am signing off on this unfinished note with no further clinical information Wilhemena Durie., MD   Vernie Murders, PA-C  [facility] [phone] (phone) [phone] (fax)  [place]

## 2020-02-26 LAB — COMPREHENSIVE METABOLIC PANEL
ALT: 23 IU/L (ref 0–44)
AST: 22 IU/L (ref 0–40)
Albumin/Globulin Ratio: 2 (ref 1.2–2.2)
Albumin: 4.4 g/dL (ref 3.7–4.7)
Alkaline Phosphatase: 58 IU/L (ref 44–121)
BUN/Creatinine Ratio: 12 (ref 10–24)
BUN: 12 mg/dL (ref 8–27)
Bilirubin Total: 1.1 mg/dL (ref 0.0–1.2)
CO2: 24 mmol/L (ref 20–29)
Calcium: 9.5 mg/dL (ref 8.6–10.2)
Chloride: 104 mmol/L (ref 96–106)
Creatinine, Ser: 1.01 mg/dL (ref 0.76–1.27)
GFR calc Af Amer: 86 mL/min/{1.73_m2} (ref >59)
GFR calc non Af Amer: 74 mL/min/{1.73_m2} (ref >59)
Globulin, Total: 2.2 g/dL (ref 1.5–4.5)
Glucose: 91 mg/dL (ref 65–99)
Potassium: 4.7 mmol/L (ref 3.5–5.2)
Sodium: 141 mmol/L (ref 134–144)
Total Protein: 6.6 g/dL (ref 6.0–8.5)

## 2020-02-26 LAB — LIPID PANEL
Chol/HDL Ratio: 3 ratio (ref 0.0–5.0)
Cholesterol, Total: 155 mg/dL (ref 100–199)
HDL: 51 mg/dL (ref >39)
LDL Chol Calc (NIH): 88 mg/dL (ref 0–99)
Triglycerides: 83 mg/dL (ref 0–149)
VLDL Cholesterol Cal: 16 mg/dL (ref 5–40)

## 2020-02-26 LAB — CBC WITH DIFFERENTIAL/PLATELET
Basophils Absolute: 0 10*3/uL (ref 0.0–0.2)
Basos: 0 %
EOS (ABSOLUTE): 0.1 10*3/uL (ref 0.0–0.4)
Eos: 1 %
Hematocrit: 42.4 % (ref 37.5–51.0)
Hemoglobin: 14.9 g/dL (ref 13.0–17.7)
Immature Grans (Abs): 0 10*3/uL (ref 0.0–0.1)
Immature Granulocytes: 0 %
Lymphocytes Absolute: 2 10*3/uL (ref 0.7–3.1)
Lymphs: 30 %
MCH: 31 pg (ref 26.6–33.0)
MCHC: 35.1 g/dL (ref 31.5–35.7)
MCV: 88 fL (ref 79–97)
Monocytes Absolute: 0.6 10*3/uL (ref 0.1–0.9)
Monocytes: 9 %
Neutrophils Absolute: 3.9 10*3/uL (ref 1.4–7.0)
Neutrophils: 60 %
Platelets: 173 10*3/uL (ref 150–450)
RBC: 4.81 x10E6/uL (ref 4.14–5.80)
RDW: 12.3 % (ref 11.6–15.4)
WBC: 6.7 10*3/uL (ref 3.4–10.8)

## 2020-02-26 LAB — TSH: TSH: 2.32 u[IU]/mL (ref 0.450–4.500)

## 2020-03-18 ENCOUNTER — Other Ambulatory Visit: Payer: Self-pay | Admitting: Family Medicine

## 2020-03-18 DIAGNOSIS — R251 Tremor, unspecified: Secondary | ICD-10-CM

## 2020-03-18 DIAGNOSIS — R000 Tachycardia, unspecified: Secondary | ICD-10-CM

## 2020-03-18 DIAGNOSIS — R12 Heartburn: Secondary | ICD-10-CM

## 2020-03-19 ENCOUNTER — Other Ambulatory Visit: Payer: Self-pay | Admitting: Family Medicine

## 2020-03-19 DIAGNOSIS — R000 Tachycardia, unspecified: Secondary | ICD-10-CM

## 2020-03-19 DIAGNOSIS — R251 Tremor, unspecified: Secondary | ICD-10-CM

## 2020-03-20 NOTE — Telephone Encounter (Signed)
Requested medications are due for refill today.  no  Requested medications are on the active medications list.  yes  Last refill. 03/19/2020  Future visit scheduled.    Notes to clinic.  Pt wants RX converted to 90 day supply.

## 2020-03-21 DIAGNOSIS — X32XXXA Exposure to sunlight, initial encounter: Secondary | ICD-10-CM | POA: Diagnosis not present

## 2020-03-21 DIAGNOSIS — L570 Actinic keratosis: Secondary | ICD-10-CM | POA: Diagnosis not present

## 2020-03-21 DIAGNOSIS — D0439 Carcinoma in situ of skin of other parts of face: Secondary | ICD-10-CM | POA: Diagnosis not present

## 2020-03-21 DIAGNOSIS — D485 Neoplasm of uncertain behavior of skin: Secondary | ICD-10-CM | POA: Diagnosis not present

## 2020-03-30 DIAGNOSIS — M47812 Spondylosis without myelopathy or radiculopathy, cervical region: Secondary | ICD-10-CM | POA: Diagnosis not present

## 2020-04-20 DIAGNOSIS — D0439 Carcinoma in situ of skin of other parts of face: Secondary | ICD-10-CM | POA: Diagnosis not present

## 2020-05-16 NOTE — Progress Notes (Addendum)
Subjective:   Benjamin Gutierrez is a 70 y.o. male who presents for Medicare Annual/Subsequent preventive examination.  I connected with Allie Bossier today by telephone and verified that I am speaking with the correct person using two identifiers. Location patient: home Location provider: work Persons participating in the virtual visit: patient, provider.   I discussed the limitations, risks, security and privacy concerns of performing an evaluation and management service by telephone and the availability of in person appointments. I also discussed with the patient that there may be a patient responsible charge related to this service. The patient expressed understanding and verbally consented to this telephonic visit.    Interactive audio and video telecommunications were attempted between this provider and patient, however failed, due to patient having technical difficulties OR patient did not have access to video capability.  We continued and completed visit with audio only.   Review of Systems    N/A  Cardiac Risk Factors include: advanced age (>43men, >90 women);male gender;hypertension     Objective:    There were no vitals filed for this visit. There is no height or weight on file to calculate BMI.  Advanced Directives 05/17/2020 10/18/2017 12/13/2016 12/13/2016 12/13/2016 12/07/2016 09/10/2016  Does Patient Have a Medical Advance Directive? Yes Yes No No No No Yes  Type of Paramedic of [place];Living will [place];Living will - - - - Press photographer;Living will  Copy of [place] in Chart? No - copy requested No - copy requested - - - - -  Would patient like information on creating a medical advance directive? - - No - Patient declined No - Patient declined No - Patient declined Yes (MAU/Ambulatory/Procedural Areas - Information given) -    Current Medications (verified) Outpatient Encounter  Medications as of 05/17/2020  Medication Sig   acetaminophen (TYLENOL) 500 MG tablet Take 500 mg by mouth every 6 (six) hours as needed.   DEXILANT 60 MG capsule TAKE 1 CAPSULE BY MOUTH ONCE DAILY   ibuprofen (ADVIL,MOTRIN) 200 MG tablet Take 200 mg by mouth every 6 (six) hours as needed.   metoprolol tartrate (LOPRESSOR) 25 MG tablet TAKE 1 TABLET BY MOUTH TWICE A DAY   naproxen (EC NAPROSYN) 500 MG EC tablet Take 500 mg by mouth as needed.    celecoxib (CELEBREX) 100 MG capsule Take 1 capsule (100 mg total) by mouth daily. (Patient not taking: Reported on 05/17/2020)   No facility-administered encounter medications on file as of 05/17/2020.    Allergies (verified) Betadine [povidone iodine], Fentanyl, and Penicillins   History: Past Medical History:  Diagnosis Date   Acid reflux 08/06/2014   Anxiety 08/06/2014   Arthritis    Blood in feces 08/06/2014   Brachial neuritis 01/02/2008   Cancer ([place])    forehead   Decreased libido 08/06/2014   Dizziness and giddiness 08/06/2014   ED (erectile dysfunction) of organic origin 08/06/2014   GERD (gastroesophageal reflux disease)    H/O arthrodesis 08/06/2014   Leg varices 08/06/2014   Past Surgical History:  Procedure Laterality Date   [place]  2009   radiculopathy from past fusion and arthritic changes   COLONOSCOPY  2013   ESOPHAGOGASTRODUODENOSCOPY ENDOSCOPY     EYE SURGERY  01/02/2018   right eye lid mole removed   LAMINECTOMY  2005   PAROTIDECTOMY Left 12/13/2016   Procedure: TOTAL PAROTIDECTOMY WITH FACIAL NERVE MONITORING;  Surgeon: Carloyn Manner,  MD;  Location: [facility] ORS;  Service: ENT;  Laterality: Left;   Family History  Problem Relation Age of Onset   Esophageal cancer Mother    Alcohol abuse Father    Prostate cancer Brother    Parkinson's disease Maternal Uncle    Dementia Paternal Grandmother    Social History   Socioeconomic History   Marital status: Married     Spouse name: Bethena Roys   Number of children: 2   Years of education: 12   Highest education level: High school graduate  Occupational History   Occupation: Retired  Tobacco Use   Smoking status: Never Smoker   Smokeless tobacco: Never Used  Scientific laboratory technician Use: Never used  Substance and Sexual Activity   Alcohol use: Not Currently    Alcohol/week: 0.0 standard drinks   Drug use: No   Sexual activity: Yes    Birth control/protection: None  Other Topics Concern   Not on file  Social History Narrative   Not on file   Social Determinants of Health   Financial Resource Strain: Low Risk    Difficulty of Paying Living Expenses: Not hard at all  Food Insecurity: No Food Insecurity   Worried About Charity fundraiser in the Last Year: Never true   [place] in the Last Year: Never true  Transportation Needs: No Transportation Needs   Lack of Transportation (Medical): No   Lack of Transportation (Non-Medical): No  Physical Activity: Insufficiently Active   Days of Exercise per Week: 2 days   Minutes of Exercise per Session: 20 min  Stress: No Stress Concern Present   Feeling of Stress : Not at all  Social Connections: Moderately Integrated   Frequency of Communication with Friends and Family: More than three times a week   Frequency of Social Gatherings with Friends and Family: More than three times a week   Attends Religious Services: More than 4 times per year   Active Member of [employer] or Organizations: No   Attends Music therapist: Never   Marital Status: Married    Tobacco Counseling Counseling given: Not Answered   Clinical Intake:  Pre-visit preparation completed: Yes  Pain : No/denies pain     Nutritional Risks: None Diabetes: No  How often do you need to have someone help you when you read instructions, pamphlets, or other written materials from your doctor or pharmacy?: 1 - Never  Diabetic? No  Interpreter Needed?: No  Information  entered by :: [facility], LPN   Activities of Daily Living In your present state of health, do you have any difficulty performing the following activities: 05/17/2020 02/25/2020  Hearing? Y N  Comment Due to tinnitus in both ears. -  Vision? N N  Difficulty concentrating or making decisions? N N  Walking or climbing stairs? N N  Dressing or bathing? N N  Doing errands, shopping? N N  Preparing Food and eating ? N -  Using the Toilet? N -  In the past six months, have you accidently leaked urine? N -  Do you have problems with loss of bowel control? N -  Managing your Medications? N -  Managing your Finances? N -  Housekeeping or managing your Housekeeping? N -  Some recent data might be hidden    Patient Care Team: Chrismon, Driscilla Grammes as PCP - General (Physician Assistant) Karle Starch, MD as Referring Physician (Ophthalmology) [name], MD as Consulting Physician (Urology) Brasington,  Nila Nephew, MD as Referring Physician (Ophthalmology) Earnie Larsson, MD as Consulting Physician (Neurosurgery) Dasher, Rayvon Char, MD (Dermatology)  Indicate any recent Medical Services you may have received from other than [facility] providers in the past year (date may be approximate).     Assessment:   This is a routine wellness examination for [place].  Hearing/Vision screen No exam data present  Dietary issues and exercise activities discussed: Current Exercise Habits: Structured exercise class, Type of exercise: treadmill;Other - see comments (rides a stationary bike), Time (Minutes): 40, Frequency (Times/Week): 2 (to 3 days), Weekly Exercise (Minutes/Week): 80, Intensity: Mild, Exercise limited by: None identified  Goals      DIET - INCREASE WATER INTAKE     Recommend to drink at least 6-8 8oz glasses of water per day.       Depression Screen PHQ 2/9 Scores 05/17/2020 02/25/2020 01/14/2018 11/14/2017 10/18/2017 10/18/2017 09/10/2016  PHQ - 2 Score 0 0 0 0 0 0 0  PHQ- 9 Score - - - 0 0  - -    Fall Risk Fall Risk  05/17/2020 02/25/2020 09/15/2019 01/14/2018 11/14/2017  Falls in the past year? 0 0 0 0 No  Comment - - Emmi Telephone Survey: data to providers prior to load - -  Number falls in past yr: 0 0 - - -  Injury with Fall? 0 0 - - -    FALL RISK PREVENTION PERTAINING TO THE HOME:  Any stairs in or around the home? Yes  If so, are there any without handrails? No  Home free of loose throw rugs in walkways, pet beds, electrical cords, etc? Yes  Adequate lighting in your home to reduce risk of falls? Yes   ASSISTIVE DEVICES UTILIZED TO PREVENT FALLS:  Life alert? No  Use of a cane, walker or w/c? No  Grab bars in the bathroom? No  Shower chair or bench in shower? No  Elevated toilet seat or a handicapped toilet? Yes    Cognitive Function: Normal cognitive status assessed by observation by this Nurse Health Advisor. No abnormalities found.       6CIT Screen 10/18/2017  What Year? 0 points  What month? 0 points  What time? 0 points  Count back from 20 0 points  Months in reverse 0 points  Repeat phrase 0 points  Total Score 0    Immunizations Immunization History  Administered Date(s) Administered   H1N1 01/02/2008   Influenza, High Dose Seasonal PF 11/14/2017, 10/21/2019   Influenza-Unspecified 11/22/2015, 12/04/2016   Moderna Sars-Covid-2 Vaccination 04/03/2019, 05/02/2019, 01/07/2020   Pneumococcal Conjugate-13 05/28/2014   Pneumococcal Polysaccharide-23 11/22/2015   Td 11/22/2004   Tdap 10/10/2010   Zoster 09/01/2010, 12/25/2012   Zoster Recombinat (Shingrix) 08/28/2017    TDAP status: Up to date  Flu Vaccine status: Up to date  Pneumococcal vaccine status: Up to date  Covid-19 vaccine status: Completed vaccines  Qualifies for Shingles Vaccine? Yes   Zostavax completed Yes   Shingrix Completed?: Yes per pt  Screening Tests Health Maintenance  Topic Date Due   COLONOSCOPY (Pts 45-12yrs Insurance coverage will need to be confirmed)   06/18/2019   TETANUS/TDAP  10/09/2020   INFLUENZA VACCINE  Completed   COVID-19 Vaccine  Completed   Hepatitis C Screening  Completed   PNA vac Low Risk Adult  Completed   HPV VACCINES  Aged Out    Health Maintenance  Health Maintenance Due  Topic Date Due   COLONOSCOPY (Pts 45-82yrs Insurance coverage will need to be  confirmed)  06/18/2019    Colorectal cancer screening: Referral to GI placed today. Pt aware the office will call re: appt.  Lung Cancer Screening: (Low Dose CT Chest recommended if Age 61-80 years, 30 pack-year currently smoking OR have quit w/in 15years.) does not qualify.   Additional Screening:  Hepatitis C Screening: Up to date  Vision Screening: Recommended annual ophthalmology exams for early detection of glaucoma and other disorders of the eye. Is the patient up to date with their annual eye exam?  Yes  Who is the provider or what is the name of the office in which the patient attends annual eye exams? Dr Wallace Going @ [place] If pt is not established with a provider, would they like to be referred to a provider to establish care? No .   Dental Screening: Recommended annual dental exams for proper oral hygiene  Community Resource Referral / Chronic Care Management: CRR required this visit?  No   CCM required this visit?  No      Plan:     I have personally reviewed and noted the following in the patient's chart:   Medical and social history Use of alcohol, tobacco or illicit drugs  Current medications and supplements Functional ability and status Nutritional status Physical activity Advanced directives List of other physicians Hospitalizations, surgeries, and ER visits in previous 12 months Vitals Screenings to include cognitive, depression, and falls Referrals and appointments  In addition, I have reviewed and discussed with patient certain preventive protocols, quality metrics, and best practice recommendations. A written personalized care  plan for preventive services as well as general preventive health recommendations were provided to patient.     Francesca Strome [place], [place]   [id number]/00/9233   Nurse Notes: Colonoscopy referral placed today.   Reviewed note of Nurse Health Advisor's screening. Agree with documentation and recommendations.

## 2020-05-17 ENCOUNTER — Other Ambulatory Visit: Payer: Self-pay

## 2020-05-17 ENCOUNTER — Ambulatory Visit (INDEPENDENT_AMBULATORY_CARE_PROVIDER_SITE_OTHER): Payer: PPO

## 2020-05-17 DIAGNOSIS — Z0000 Encounter for general adult medical examination without abnormal findings: Secondary | ICD-10-CM

## 2020-05-17 DIAGNOSIS — Z1211 Encounter for screening for malignant neoplasm of colon: Secondary | ICD-10-CM

## 2020-05-23 ENCOUNTER — Telehealth (INDEPENDENT_AMBULATORY_CARE_PROVIDER_SITE_OTHER): Payer: Self-pay | Admitting: Gastroenterology

## 2020-05-23 ENCOUNTER — Other Ambulatory Visit: Payer: Self-pay

## 2020-05-23 DIAGNOSIS — Z86010 Personal history of colonic polyps: Secondary | ICD-10-CM

## 2020-05-23 MED ORDER — NA SULFATE-K SULFATE-MG SULF 17.5-3.13-1.6 GM/177ML PO SOLN: 1.0000 | Freq: Once | ORAL | 0 refills | Status: AC

## 2020-05-23 NOTE — Progress Notes (Signed)
Gastroenterology Pre-Procedure Review  Request Date: Tuesday 06/07/20 Requesting Physician: Dr. Allen Norris  PATIENT REVIEW QUESTIONS: The patient responded to the following health history questions as indicated:    1. Are you having any GI issues? no 2. Do you have a personal history of Polyps? yes (tubular adenoma 06/18/14) 3. Do you have a family history of Colon Cancer or Polyps? uncle may have had colon cancer or colon polyps 4. Diabetes Mellitus? no 5. Joint replacements in the past 12 months?no 6. Major health problems in the past 3 months?no 7. Any artificial heart valves, MVP, or defibrillator?no    MEDICATIONS & ALLERGIES:    Patient reports the following regarding taking any anticoagulation/antiplatelet therapy:   Plavix, Coumadin, Eliquis, Xarelto, Lovenox, Pradaxa, Brilinta, or Effient? no Aspirin? no  Patient confirms/reports the following medications:  Current Outpatient Medications  Medication Sig Dispense Refill  . acetaminophen (TYLENOL) 500 MG tablet Take 500 mg by mouth every 6 (six) hours as needed.    Marland Kitchen DEXILANT 60 MG capsule TAKE 1 CAPSULE BY MOUTH ONCE DAILY 90 capsule 1  . naproxen (EC NAPROSYN) 500 MG EC tablet Take 500 mg by mouth as needed.     Marland Kitchen ibuprofen (ADVIL,MOTRIN) 200 MG tablet Take 200 mg by mouth every 6 (six) hours as needed. (Patient not taking: Reported on 05/23/2020)    . metoprolol tartrate (LOPRESSOR) 25 MG tablet TAKE 1 TABLET BY MOUTH TWICE A DAY (Patient not taking: Reported on 05/23/2020) 240 tablet 1   No current facility-administered medications for this visit.    Patient confirms/reports the following allergies:  Allergies  Allergen Reactions  . Betadine [Povidone Iodine] Itching  . Fentanyl Other (See Comments)    Pain patch and after passed out and admitted to the hospital  . Penicillins Rash    No orders of the defined types were placed in this encounter.   AUTHORIZATION INFORMATION Primary Insurance: 1D#: Group #:  Secondary  Insurance: 1D#: Group #:  SCHEDULE INFORMATION: Date: Tuesday 06/07/20 Time: Location: [place]

## 2020-06-06 ENCOUNTER — Encounter: Payer: Self-pay | Admitting: Gastroenterology

## 2020-06-07 ENCOUNTER — Encounter: Payer: Self-pay | Admitting: Gastroenterology

## 2020-06-07 ENCOUNTER — Ambulatory Visit: Payer: PPO | Admitting: Registered Nurse

## 2020-06-07 ENCOUNTER — Ambulatory Visit
Admission: RE | Admit: 2020-06-07 | Discharge: 2020-06-07 | Disposition: A | Discharge status: Home / Self Care | Payer: PPO | Attending: Gastroenterology | Admitting: Gastroenterology

## 2020-06-07 ENCOUNTER — Encounter
Admission: RE | Disposition: A | Discharge status: Home / Self Care | Payer: Self-pay | Source: Home / Self Care | Attending: Gastroenterology

## 2020-06-07 DIAGNOSIS — K621 Rectal polyp: Secondary | ICD-10-CM | POA: Diagnosis not present

## 2020-06-07 DIAGNOSIS — Z1211 Encounter for screening for malignant neoplasm of colon: Secondary | ICD-10-CM | POA: Diagnosis not present

## 2020-06-07 DIAGNOSIS — Z880 Allergy status to penicillin: Secondary | ICD-10-CM | POA: Diagnosis not present

## 2020-06-07 DIAGNOSIS — Z860100 Personal history of colon polyps, unspecified: Secondary | ICD-10-CM

## 2020-06-07 DIAGNOSIS — Z885 Allergy status to narcotic agent status: Secondary | ICD-10-CM | POA: Diagnosis not present

## 2020-06-07 DIAGNOSIS — D122 Benign neoplasm of ascending colon: Secondary | ICD-10-CM | POA: Diagnosis not present

## 2020-06-07 DIAGNOSIS — Z888 Allergy status to other drugs, medicaments and biological substances status: Secondary | ICD-10-CM | POA: Insufficient documentation

## 2020-06-07 DIAGNOSIS — Z79899 Other long term (current) drug therapy: Secondary | ICD-10-CM | POA: Diagnosis not present

## 2020-06-07 DIAGNOSIS — K635 Polyp of colon: Secondary | ICD-10-CM | POA: Diagnosis not present

## 2020-06-07 HISTORY — PX: COLONOSCOPY WITH PROPOFOL: SHX5780

## 2020-06-07 SURGERY — COLONOSCOPY WITH PROPOFOL
Anesthesia: General

## 2020-06-07 MED ORDER — SODIUM CHLORIDE 0.9 % IV SOLN: INTRAVENOUS | Status: DC

## 2020-06-07 MED ORDER — PROPOFOL 500 MG/50ML IV EMUL
INTRAVENOUS | Status: DC | PRN
  Administered 2020-06-07: 150 ug/kg/min via INTRAVENOUS

## 2020-06-07 MED ORDER — LIDOCAINE HCL (PF) 2 % IJ SOLN
INTRAMUSCULAR | Status: AC
  Filled 2020-06-07: qty 20

## 2020-06-07 MED ORDER — PHENYLEPHRINE HCL (PRESSORS) 10 MG/ML IV SOLN
INTRAVENOUS | Status: AC
  Filled 2020-06-07: qty 1

## 2020-06-07 MED ORDER — PROPOFOL 10 MG/ML IV BOLUS
INTRAVENOUS | Status: DC | PRN
  Administered 2020-06-07: 80 mg via INTRAVENOUS

## 2020-06-07 MED ORDER — PROPOFOL 500 MG/50ML IV EMUL
INTRAVENOUS | Status: AC
  Filled 2020-06-07: qty 200

## 2020-06-07 MED ORDER — LIDOCAINE HCL (CARDIAC) PF 100 MG/5ML IV SOSY
PREFILLED_SYRINGE | INTRAVENOUS | Status: DC | PRN
  Administered 2020-06-07: 100 mg via INTRAVENOUS

## 2020-06-07 NOTE — Anesthesia Preprocedure Evaluation (Signed)
Anesthesia Evaluation  Patient identified by MRN, date of birth, ID band Patient awake    Reviewed: Allergy & Precautions, H&P , NPO status , Patient's Chart, lab work & pertinent test results  History of Anesthesia Complications Negative for: history of anesthetic complications  Airway Mallampati: II  TM Distance: >3 FB     Dental  (+) Teeth Intact   Pulmonary neg pulmonary ROS, neg sleep apnea, neg COPD,    breath sounds clear to auscultation       Cardiovascular (-) angina(-) Past MI and (-) Cardiac Stents negative cardio ROS  (-) dysrhythmias  Rhythm:regular Rate:Normal     Neuro/Psych PSYCHIATRIC DISORDERS Anxiety radiculopathy negative neurological ROS     GI/Hepatic Neg liver ROS, GERD  Controlled,  Endo/Other  negative endocrine ROS  Renal/GU negative Renal ROS  negative genitourinary   Musculoskeletal   Abdominal   Peds  Hematology negative hematology ROS (+)   Anesthesia Other Findings Past Medical History: 08/06/2014: Acid reflux 08/06/2014: Anxiety No date: Arthritis 08/06/2014: Blood in feces 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 No date: Cancer ([place])     Comment:  forehead 08/06/2014: Decreased libido 08/06/2014: Dizziness and giddiness 08/06/2014: ED (erectile dysfunction) of organic origin No date: GERD (gastroesophageal reflux disease) 08/06/2014: H/O arthrodesis 08/06/2014: Leg varices  Past Surgical History: 1993: CARDIAC CATHETERIZATION 2009: CERVICAL SPINE SURGERY     Comment:  radiculopathy from past fusion and arthritic changes 2013: COLONOSCOPY No date: ESOPHAGOGASTRODUODENOSCOPY ENDOSCOPY 01/02/2018: EYE SURGERY     Comment:  right eye lid mole removed 2005: LAMINECTOMY 12/13/2016: PAROTIDECTOMY; Left     Comment:  Procedure: TOTAL PAROTIDECTOMY WITH FACIAL NERVE               MONITORING;  Surgeon: Carloyn Manner, MD;  Location:               [facility] ORS;  Service: ENT;  Laterality:  Left;  BMI    Body Mass Index: 28.13 kg/m      Reproductive/Obstetrics negative OB ROS                             Anesthesia Physical Anesthesia Plan  ASA: II  Anesthesia Plan: General   Post-op Pain Management:    Induction:   PONV Risk Score and Plan: Propofol infusion and TIVA  Airway Management Planned: Nasal Cannula  Additional Equipment:   Intra-op Plan:   Post-operative Plan:   Informed Consent: I have reviewed the patients History and Physical, chart, labs and discussed the procedure including the risks, benefits and alternatives for the proposed anesthesia with the patient or authorized representative who has indicated his/her understanding and acceptance.     Dental Advisory Given  Plan Discussed with: Anesthesiologist, CRNA and Surgeon  Anesthesia Plan Comments:         Anesthesia Quick Evaluation

## 2020-06-07 NOTE — Transfer of Care (Signed)
Immediate Anesthesia Transfer of Care Note  Patient: Benjamin Gutierrez  Procedure(s) Performed: COLONOSCOPY WITH PROPOFOL (N/A )  Patient Location: Endoscopy Unit  Anesthesia Type:General  Level of Consciousness: drowsy  Airway & Oxygen Therapy: Patient Spontanous Breathing  Post-op Assessment: Report given to RN and Post -op Vital signs reviewed and stable  Post vital signs: Reviewed and stable  Last Vitals:  Vitals Value Taken Time  BP 98/70 06/07/20 0846  Temp 36.1 C 06/07/20 0845  Pulse 85 06/07/20 0846  Resp 20 06/07/20 0846  SpO2 92 % 06/07/20 0846  Vitals shown include unvalidated device data.  Last Pain:  Vitals:   06/07/20 0845  TempSrc: Temporal  PainSc: Asleep         Complications: No complications documented.

## 2020-06-07 NOTE — Op Note (Signed)
[facility] Gastroenterology Patient Name: Benjamin Gutierrez Procedure Date: 06/07/2020 8:24 AM MRN: [id number] Account #: [id number] Date of Birth: [birth year] Admit Type: Outpatient Age: 70 Room: [facility] ENDO ROOM 4 Gender: Male Note Status: Finalized Procedure:             Colonoscopy Indications:           High risk colon cancer surveillance: Personal history                         of colonic polyps Providers:             Lucilla Lame MD, MD Referring MD:          Vickki Muff. Chrismon, MD (Referring MD) Medicines:             Propofol per Anesthesia Complications:         No immediate complications. Procedure:             Pre-Anesthesia Assessment:                        - Prior to the procedure, a History and Physical was                         performed, and patient medications and allergies were                         reviewed. The patient's tolerance of previous                         anesthesia was also reviewed. The risks and benefits                         of the procedure and the sedation options and risks                         were discussed with the patient. All questions were                         answered, and informed consent was obtained. Prior                         Anticoagulants: The patient has taken no previous                         anticoagulant or antiplatelet agents. ASA Grade                         Assessment: II - A patient with mild systemic disease.                         After reviewing the risks and benefits, the patient                         was deemed in satisfactory condition to undergo the                         procedure.  After obtaining informed consent, the colonoscope was                         passed under direct vision. Throughout the procedure,                         the patient's blood pressure, pulse, and oxygen                         saturations were monitored continuously. The                          Colonoscope was introduced through the anus and                         advanced to the the cecum, identified by appendiceal                         orifice and ileocecal valve. The colonoscopy was                         performed without difficulty. The patient tolerated                         the procedure well. The quality of the bowel                         preparation was excellent. Findings:      The perianal and digital rectal examinations were normal.      A 5 mm polyp was found in the ascending colon. The polyp was sessile.       The polyp was removed with a cold snare. Resection and retrieval were       complete.      A 2 mm polyp was found in the rectum. The polyp was sessile. The polyp       was removed with a cold snare. Resection and retrieval were complete. Impression:            - One 5 mm polyp in the ascending colon, removed with                         a cold snare. Resected and retrieved.                        - One 2 mm polyp in the rectum, removed with a cold                         snare. Resected and retrieved. Recommendation:        - Discharge patient to home.                        - Resume previous diet.                        - Continue present medications.                        - Await pathology results.                        -  Repeat colonoscopy in 7 years for surveillance. Procedure Code(s):     --- Professional ---                        [phone], Colonoscopy, flexible; with removal of                         tumor(s), polyp(s), or other lesion(s) by snare                         technique Diagnosis Code(s):     --- Professional ---                        Z86.010, Personal history of colonic polyps                        K63.5, Polyp of colon CPT copyright 2019 American Medical Association. All rights reserved. The codes documented in this report are preliminary and upon coder review may  be revised to meet current compliance  requirements. Lucilla Lame MD, MD 06/07/2020 8:44:40 AM This report has been signed electronically. Number of Addenda: 0 Note Initiated On: 06/07/2020 8:24 AM Scope Withdrawal Time: 0 hours 9 minutes 6 seconds  Total Procedure Duration: 0 hours 11 minutes 8 seconds  Estimated Blood Loss:  Estimated blood loss: none.      [facility]

## 2020-06-07 NOTE — H&P (Signed)
Lucilla Lame, MD [address] [place], [place] [address] Phone:[phone] Fax : [phone]  Primary Care Physician:  Margo Common, PA-C Primary Gastroenterologist:  Dr. Allen Norris  Pre-Procedure History & Physical: HPI:  Benjamin Gutierrez is a 70 y.o. male is here for an colonoscopy.   Past Medical History:  Diagnosis Date  . Acid reflux 08/06/2014  . Anxiety 08/06/2014  . Arthritis   . Blood in feces 08/06/2014  . 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 Decreased libido 08/06/2014  . Dizziness and giddiness 08/06/2014  . ED (erectile dysfunction) of organic origin 08/06/2014  . GERD (gastroesophageal reflux disease)   . H/O arthrodesis 08/06/2014  . Leg varices 08/06/2014    Past Surgical History:  Procedure Laterality Date  . CARDIAC CATHETERIZATION  1993  . CERVICAL SPINE SURGERY  2009   radiculopathy from past fusion and arthritic changes  . COLONOSCOPY  2013  . ESOPHAGOGASTRODUODENOSCOPY ENDOSCOPY    . EYE SURGERY  01/02/2018   right eye lid mole removed  . LAMINECTOMY  2005  . PAROTIDECTOMY Left 12/13/2016   Procedure: TOTAL PAROTIDECTOMY WITH FACIAL NERVE MONITORING;  Surgeon: Carloyn Manner, MD;  Location: [facility] ORS;  Service: ENT;  Laterality: Left;    Prior to Admission medications   Medication Sig Start Date End Date Taking? Authorizing Provider  acetaminophen (TYLENOL) 500 MG tablet Take 500 mg by mouth every 6 (six) hours as needed.   Yes [provider]  DEXILANT 60 MG capsule TAKE 1 CAPSULE BY MOUTH ONCE DAILY 03/18/20  Yes Chrismon, Vickki Muff, PA-C  ibuprofen (ADVIL,MOTRIN) 200 MG tablet Take 200 mg by mouth every 6 (six) hours as needed. Patient not taking: Reported on 05/23/2020    [provider]  metoprolol tartrate (LOPRESSOR) 25 MG tablet TAKE 1 TABLET BY MOUTH TWICE A DAY Patient not taking: Reported on 05/23/2020 03/21/20   Chrismon, Vickki Muff, PA-C  naproxen (EC NAPROSYN) 500 MG EC tablet Take 500  mg by mouth as needed.  05/02/15   [provider]    Allergies as of 05/24/2020 - Review Complete 05/23/2020  Allergen Reaction Noted  . Betadine [povidone iodine] Itching 08/06/2014  . Fentanyl Other (See Comments) 11/15/2016  . Penicillins Rash 08/06/2014    Family History  Problem Relation Age of Onset  . Esophageal cancer Mother   . Alcohol abuse Father   . Prostate cancer Brother   . Parkinson's disease Maternal Uncle   . Dementia Paternal Grandmother     Social History   Socioeconomic History  . Marital status: Married    Spouse name: Bethena Roys  . Number of children: 2  . Years of education: 44  . Highest education level: High school graduate  Occupational History  . Occupation: Retired  Tobacco Use  . Smoking status: Never Smoker  . Smokeless tobacco: Never Used  Vaping Use  . Vaping Use: Never used  Substance and Sexual Activity  . Alcohol use: Not Currently    Alcohol/week: 0.0 standard drinks  . Drug use: No  . Sexual activity: Yes    Birth control/protection: None  Other Topics Concern  . Not on file  Social History Narrative  . Not on file   Social Determinants of Health   Financial Resource Strain: Low Risk   . Difficulty of Paying Living Expenses: Not hard at all  Food Insecurity: No Food Insecurity  . Worried About Charity fundraiser in the Last Year:  Never true  . Ran Out of Food in the Last Year: Never true  Transportation Needs: No Transportation Needs  . Lack of Transportation (Medical): No  . Lack of Transportation (Non-Medical): No  Physical Activity: Insufficiently Active  . Days of Exercise per Week: 2 days  . Minutes of Exercise per Session: 20 min  Stress: No Stress Concern Present  . Feeling of Stress : Not at all  Social Connections: Moderately Integrated  . Frequency of Communication with Friends and Family: More than three times a week  . Frequency of Social Gatherings with Friends and Family: More than three times a week   . Attends Religious Services: More than 4 times per year  . Active Member of Clubs or Organizations: No  . Attends Archivist Meetings: Never  . Marital Status: Married  Human resources officer Violence: Not At Risk  . Fear of Current or Ex-Partner: No  . Emotionally Abused: No  . Physically Abused: No  . Sexually Abused: No    Review of Systems: See HPI, otherwise negative ROS  Physical Exam: BP (!) 137/95   Pulse (!) 109   Temp (!) 97.5 F (36.4 C) (Temporal)   Resp 18   Ht 5\' 8"  (1.727 m)   Wt 83.9 kg   SpO2 96%   BMI 28.13 kg/m  General:   Alert,  pleasant and cooperative in NAD Head:  Normocephalic and atraumatic. Neck:  Supple; no masses or thyromegaly. Lungs:  Clear throughout to auscultation.    Heart:  Regular rate and rhythm. Abdomen:  Soft, nontender and nondistended. Normal bowel sounds, without guarding, and without rebound.   Neurologic:  Alert and  oriented x4;  grossly normal neurologically.  Impression/Plan: Benjamin Gutierrez is here for an colonoscopy to be performed for a history of adenomatous polyps on 05/2014  Risks, benefits, limitations, and alternatives regarding  colonoscopy have been reviewed with the patient.  Questions have been answered.  All parties agreeable.   Lucilla Lame, MD  06/07/2020, 8:16 AM

## 2020-06-07 NOTE — Anesthesia Postprocedure Evaluation (Signed)
Anesthesia Post Note  Patient: Benjamin Gutierrez  Procedure(s) Performed: COLONOSCOPY WITH PROPOFOL (N/A )  Patient location during evaluation: PACU Anesthesia Type: General Level of consciousness: awake and alert Pain management: pain level controlled Vital Signs Assessment: post-procedure vital signs reviewed and stable Respiratory status: spontaneous breathing, nonlabored ventilation and respiratory function stable Cardiovascular status: blood pressure returned to baseline and stable Postop Assessment: no apparent nausea or vomiting Anesthetic complications: no   No complications documented.   Last Vitals:  Vitals:   06/07/20 0855 06/07/20 0905  BP: 104/89   Pulse:    Resp: (!) 21   Temp:    SpO2:  94%    Last Pain:  Vitals:   06/07/20 0905  TempSrc:   PainSc: 0-No pain                 Tera Mater

## 2020-06-08 ENCOUNTER — Encounter: Payer: Self-pay | Admitting: Gastroenterology

## 2020-06-08 LAB — SURGICAL PATHOLOGY

## 2020-09-05 DIAGNOSIS — D2262 Melanocytic nevi of left upper limb, including shoulder: Secondary | ICD-10-CM | POA: Diagnosis not present

## 2020-09-05 DIAGNOSIS — Z85828 Personal history of other malignant neoplasm of skin: Secondary | ICD-10-CM | POA: Diagnosis not present

## 2020-09-05 DIAGNOSIS — X32XXXA Exposure to sunlight, initial encounter: Secondary | ICD-10-CM | POA: Diagnosis not present

## 2020-09-05 DIAGNOSIS — L570 Actinic keratosis: Secondary | ICD-10-CM | POA: Diagnosis not present

## 2020-09-05 DIAGNOSIS — D225 Melanocytic nevi of trunk: Secondary | ICD-10-CM | POA: Diagnosis not present

## 2020-09-05 DIAGNOSIS — D2271 Melanocytic nevi of right lower limb, including hip: Secondary | ICD-10-CM | POA: Diagnosis not present

## 2020-09-05 DIAGNOSIS — D2261 Melanocytic nevi of right upper limb, including shoulder: Secondary | ICD-10-CM | POA: Diagnosis not present

## 2020-09-28 DIAGNOSIS — M47812 Spondylosis without myelopathy or radiculopathy, cervical region: Secondary | ICD-10-CM | POA: Diagnosis not present

## 2020-10-03 IMAGING — MR MR CERVICAL SPINE W/O CM
4 of 5 series · 18 of 48 positions shown · IV contrast (Yes)
Comparison: 04/27/2007

CLINICAL DATA: Left-sided neck pain. Weakness in the left arm.
Ongoing for 1 month.

EXAM:
MRI CERVICAL SPINE WITHOUT CONTRAST
TECHNIQUE: Multiplanar, multisequence MR imaging of the cervical spine was
performed. No intravenous contrast was administered.

[Series 2: T1 · sagittal · 3.0mm · 0.43mm/px · 3 of 12 slices shown]
[im 1/12]
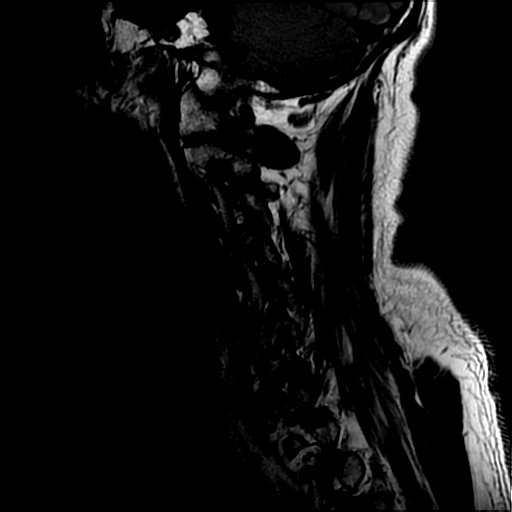
[im 8/12]
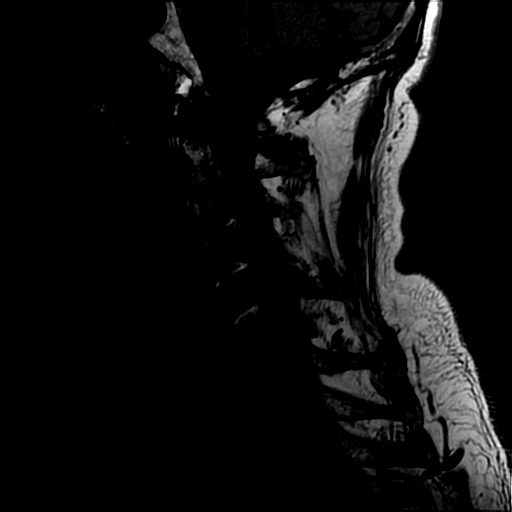
[im 12/12]
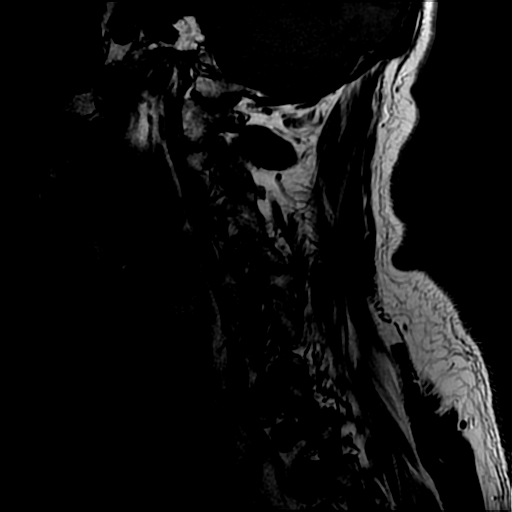

[Series 3: sag ir · sagittal · 3.0mm · 0.43mm/px · 3 of 12 slices shown]
[im 1/12]
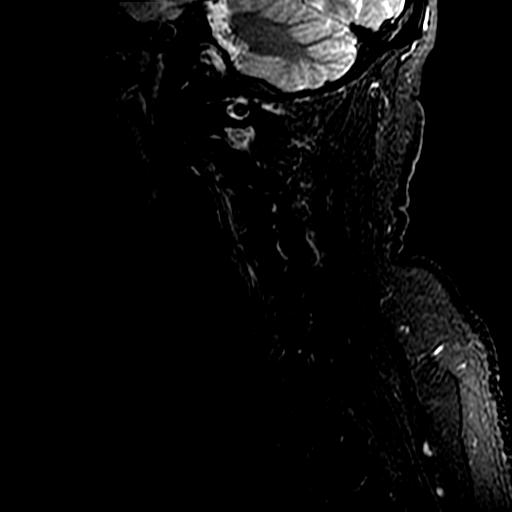
[im 8/12]
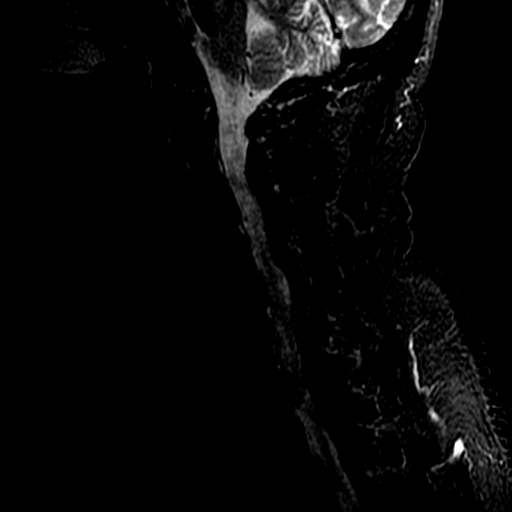
[im 12/12]
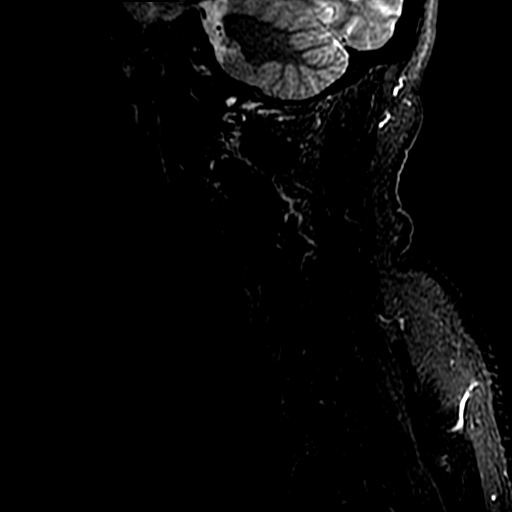

[Series 4: T2 post-contrast · sagittal · 3.0mm · 0.43mm/px · 5 of 12 slices shown]
[im 1/12]
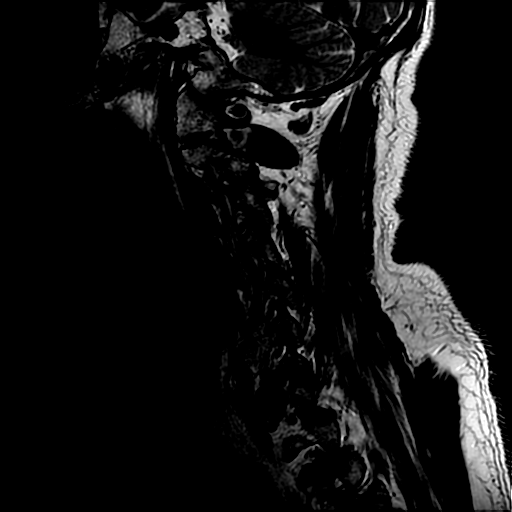
[im 3/12]
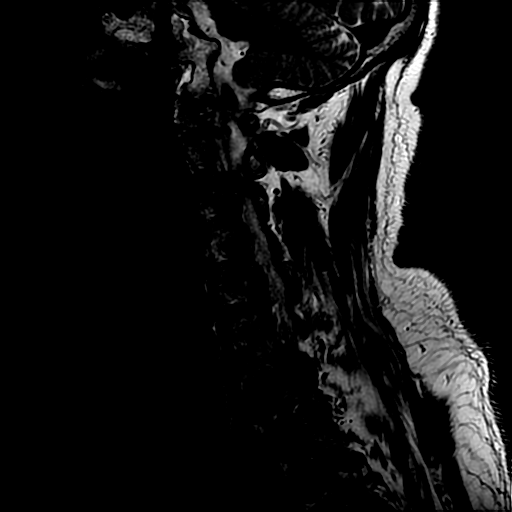
[im 6/12]
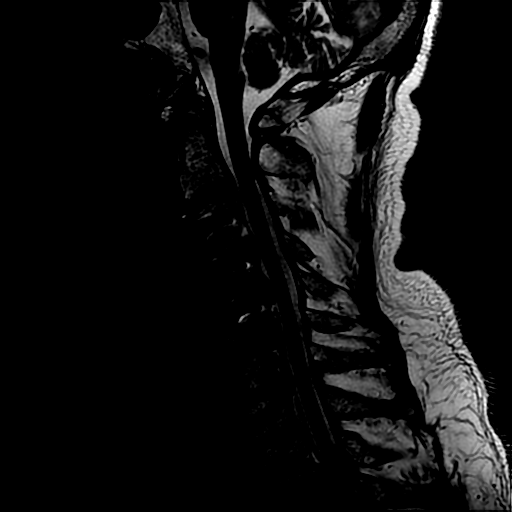
[im 9/12]
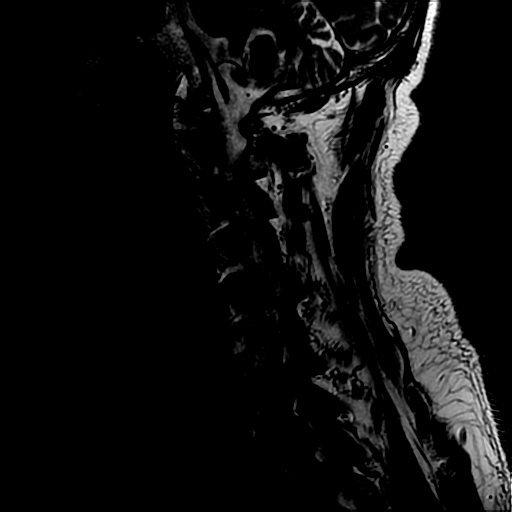
[im 12/12]
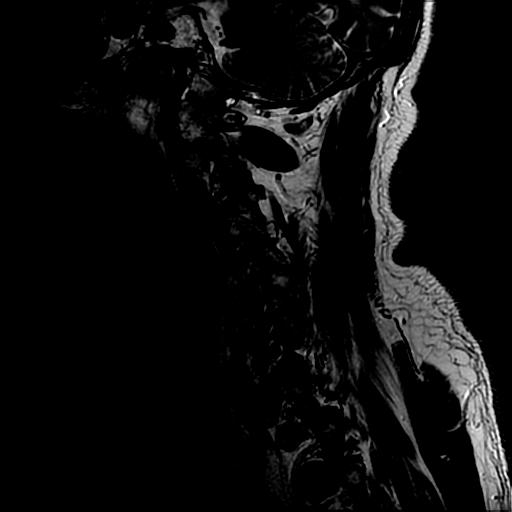

[Series 6: T2 · axial · 3.0mm · 0.37mm/px · z∈[-76,+71]mm · 7 of 54 slices shown]
[im 3/54]
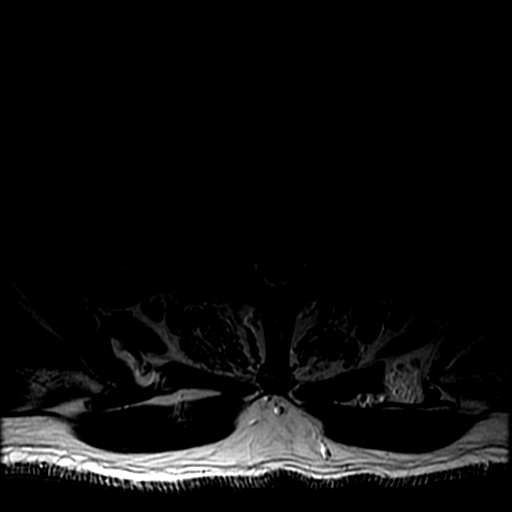
[im 8/54]
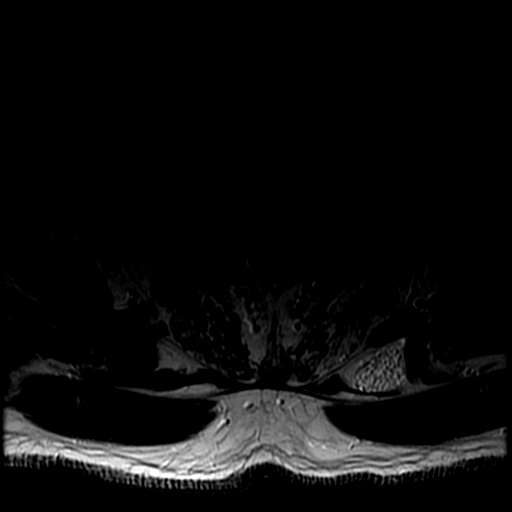
[im 16/54]
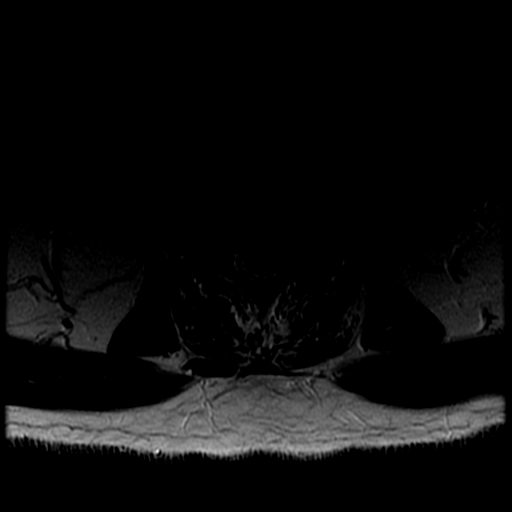
[im 24/54]
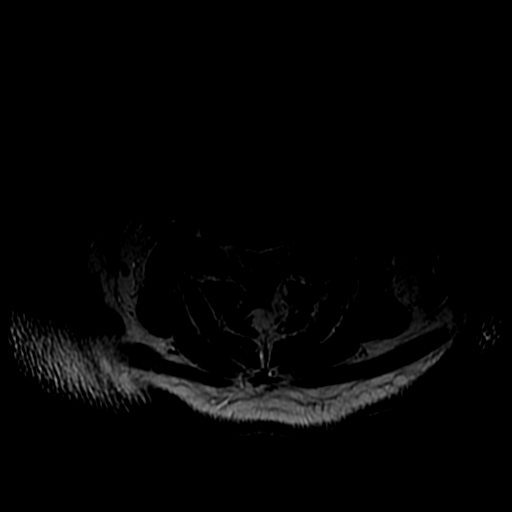
[im 27/54]
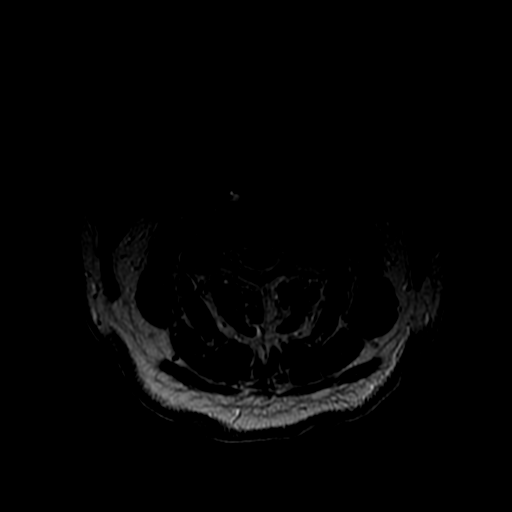
[im 30/54]
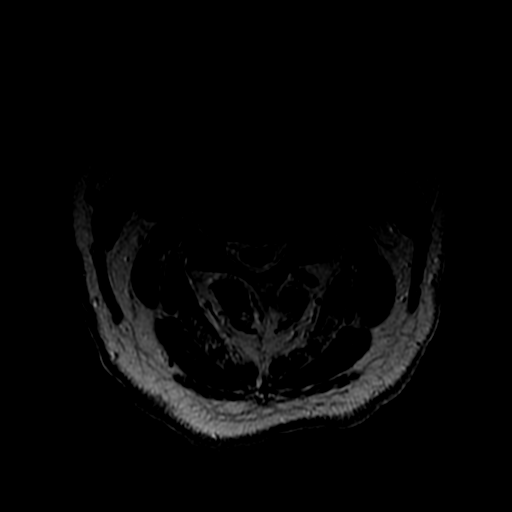
[im 46/54]
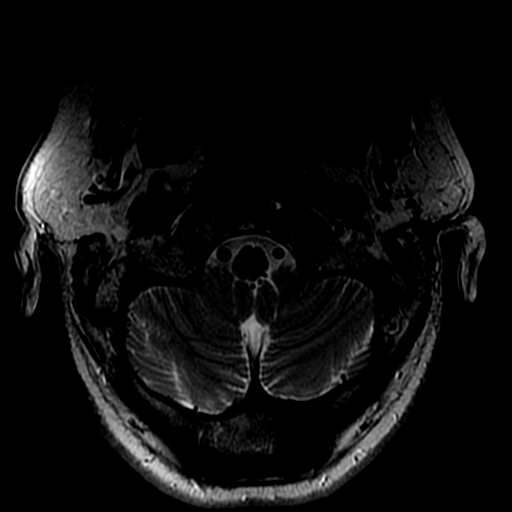

[18 of 48 positions shown; findings below may reference images not displayed]

FINDINGS: Alignment: Physiologic.

Vertebrae: No fracture, evidence of discitis, or bone lesion.

Cord: Normal signal and morphology.

Posterior Fossa, vertebral arteries, paraspinal tissues: Posterior
fossa demonstrates no focal abnormality. Vertebral artery flow voids
are maintained. Paraspinal soft tissues are unremarkable.

Disc levels:

Discs: Anterior cervical fusion from C3 through C7. degenerative
disc disease with disc height loss at C7-T1.

C2-3: Mild broad-based disc bulge. Moderate right and mild left
facet arthropathy. Mild right foraminal stenosis. No left foraminal
stenosis. No central canal stenosis.

C3-4: Interbody fusion. No right foraminal stenosis. Moderate-severe
left foraminal stenosis. Moderate left facet arthropathy. No central
canal stenosis.

C4-5: Interbody fusion. No neural foraminal stenosis. No central
canal stenosis.

C5-6: Interbody fusion. No neural foraminal stenosis. No central
canal stenosis.

C6-7: Interbody fusion. No neural foraminal stenosis. No central
canal stenosis.

C7-T1: Broad-based disc bulge. Bilateral vertebral degenerative
changes. Severe bilateral foraminal stenosis. No central canal
stenosis.

T2-3: Broad-based disc bulge. Bilateral facet arthropathy. Severe
right foraminal stenosis. Mild left foraminal stenosis.
IMPRESSION: 1. At C7-T1 there is a broad-based disc bulge. Bilateral vertebral
degenerative changes. Severe bilateral foraminal stenosis.
2. Anterior cervical fusion from C3 through C7. moderate-severe left
foraminal stenosis at C3-4.
3. At C2-3 there is a mild broad-based disc bulge. Moderate right
and mild left facet arthropathy. Mild right foraminal stenosis. No
left foraminal stenosis.
4.

## 2020-10-21 DIAGNOSIS — X32XXXA Exposure to sunlight, initial encounter: Secondary | ICD-10-CM | POA: Diagnosis not present

## 2020-10-21 DIAGNOSIS — D485 Neoplasm of uncertain behavior of skin: Secondary | ICD-10-CM | POA: Diagnosis not present

## 2020-10-21 DIAGNOSIS — L570 Actinic keratosis: Secondary | ICD-10-CM | POA: Diagnosis not present

## 2020-11-14 ENCOUNTER — Other Ambulatory Visit: Payer: Self-pay | Admitting: Family Medicine

## 2020-11-14 DIAGNOSIS — R12 Heartburn: Secondary | ICD-10-CM

## 2020-12-02 ENCOUNTER — Ambulatory Visit: Payer: Self-pay

## 2020-12-02 ENCOUNTER — Telehealth: Payer: Self-pay

## 2020-12-02 NOTE — Telephone Encounter (Signed)
Patient states that he spoke with clinical staff a hour ago and was triaged, he states that jaw pain has been improving as the day has gone on. Patient encouraged to go to urgent care this week if pain returns or worsens. Patient understood. KW

## 2020-12-02 NOTE — Telephone Encounter (Signed)
Copied from [place] [id number]. Topic: Appointment Scheduling - Scheduling Inquiry for Clinic >> Dec 02, 2020  8:25 AM Alanda Slim E wrote: Reason for CRM: Pt asked to be seen today about soreness in his neck and jaw line area on the left side of his face / he thinks he may have an infection / he states it gets worse by the hr / please advise

## 2020-12-02 NOTE — Telephone Encounter (Signed)
Pt. Reports several days ago started having left jaw and neck pain. No tooth pain. No fever. Noticed some drainage from left eye as well. Would like to be worked in if possible today. Please advise.    Reason for Disposition  Face pain present > 24 hours  Answer Assessment - Initial Assessment Questions 1. ONSET: "When did the pain start?" (e.g., minutes, hours, days)     Several days ago 2. ONSET: "Does the pain come and go, or has it been constant since it started?" (e.g., constant, intermittent, fleeting)     Comes and goes 3. SEVERITY: "How bad is the pain?"   (Scale 1-10; mild, moderate or severe)   - MILD (1-3): doesn't interfere with normal activities    - MODERATE (4-7): interferes with normal activities or awakens from sleep    - SEVERE (8-10): excruciating pain, unable to do any normal activities      Moderate 4. LOCATION: "Where does it hurt?"      Left jaw and face 5. RASH: "Is there any redness, rash, or swelling of the face?"     No 6. FEVER: "Do you have a fever?" If Yes, ask: "What is it, how was it measured, and when did it start?"      No 7. OTHER SYMPTOMS: "Do you have any other symptoms?" (e.g., fever, toothache, nasal discharge, nasal congestion, clicking sensation in jaw joint)     No 8. PREGNANCY: "Is there any chance you are pregnant?" "When was your last menstrual period?"     N/a  Protocols used: Face Pain-A-AH

## 2020-12-02 NOTE — Telephone Encounter (Signed)
FYI  Patient reports pain  on and off  for several days. Reports pain is better today. Patient denies any chest pain, shortness of breath, pain or numbness radiating to arms. Patient reports some cough and he did get a flu vaccine on Wednesday. Patient advised to go to ED or urgent care if symptoms are worsening or change. Patient schedule for 12/06/20.

## 2020-12-06 ENCOUNTER — Ambulatory Visit (INDEPENDENT_AMBULATORY_CARE_PROVIDER_SITE_OTHER): Payer: PPO | Admitting: Family Medicine

## 2020-12-06 ENCOUNTER — Other Ambulatory Visit: Payer: PPO

## 2020-12-06 ENCOUNTER — Encounter: Payer: Self-pay | Admitting: Family Medicine

## 2020-12-06 ENCOUNTER — Other Ambulatory Visit: Payer: Self-pay

## 2020-12-06 VITALS — BP 125/85 | HR 88 | Temp 97.3°F | Resp 16 | Wt 184.2 lb

## 2020-12-06 DIAGNOSIS — H0015 Chalazion left lower eyelid: Secondary | ICD-10-CM | POA: Diagnosis not present

## 2020-12-06 DIAGNOSIS — N400 Enlarged prostate without lower urinary tract symptoms: Secondary | ICD-10-CM

## 2020-12-06 DIAGNOSIS — I4510 Unspecified right bundle-branch block: Secondary | ICD-10-CM | POA: Diagnosis not present

## 2020-12-06 DIAGNOSIS — R6884 Jaw pain: Secondary | ICD-10-CM | POA: Insufficient documentation

## 2020-12-06 MED ORDER — METAXALONE 800 MG PO TABS: 800.0000 mg | ORAL_TABLET | Freq: Three times a day (TID) | ORAL | 0 refills | Status: AC

## 2020-12-06 MED ORDER — PREDNISONE 20 MG PO TABS: 20.0000 mg | ORAL_TABLET | Freq: Every day | ORAL | 0 refills | Status: DC

## 2020-12-06 MED ORDER — ERYTHROMYCIN 5 MG/GM OP OINT: 1.0000 "application " | TOPICAL_OINTMENT | Freq: Every day | OPHTHALMIC | 0 refills | Status: DC

## 2020-12-06 NOTE — Assessment & Plan Note (Addendum)
Trial of 14 day muscle relaxant Encouraged to use dental guard if available  Pt encouraged to sleep upright if able to decrease swelling Encouraged to use flonase 2x/day- pt current report of use 1x/day

## 2020-12-06 NOTE — Assessment & Plan Note (Signed)
Chronic; request for medication refill

## 2020-12-06 NOTE — Progress Notes (Signed)
Established patient visit   Patient: Benjamin Gutierrez   DOB: 02/05/1951   70 y.o. Male  MRN: [id number] Visit Date: 12/06/2020  Today's healthcare provider: Gwyneth Sprout, FNP   Chief Complaint  Patient presents with   Neck Pain   Subjective    Neck Pain  This is a new problem. The current episode started in the past 7 days. The problem has been unchanged. The pain is associated with nothing. The pain is present in the left side. The symptoms are aggravated by position. Associated symptoms include headaches. Pertinent negatives include no chest pain, fever, leg pain, numbness, pain with swallowing, paresis, photophobia, syncope, tingling, trouble swallowing, visual change, weakness or weight loss. Associated symptoms comments: Jaw pain. He has tried NSAIDs (Naproxen) for the symptoms. The treatment provided mild relief.     Medications: Outpatient Medications Prior to Visit  Medication Sig   acetaminophen (TYLENOL) 500 MG tablet Take 500 mg by mouth every 6 (six) hours as needed.   DEXILANT 60 MG capsule TAKE 1 CAPSULE BY MOUTH ONCE DAILY   ibuprofen (ADVIL,MOTRIN) 200 MG tablet Take 200 mg by mouth every 6 (six) hours as needed.   metoprolol tartrate (LOPRESSOR) 25 MG tablet TAKE 1 TABLET BY MOUTH TWICE A DAY   naproxen (EC NAPROSYN) 500 MG EC tablet Take 500 mg by mouth as needed.    No facility-administered medications prior to visit.    Review of Systems  Constitutional:  Negative for fever and weight loss.  HENT:  Negative for trouble swallowing.   Eyes:  Negative for photophobia.  Cardiovascular:  Negative for chest pain and syncope.  Musculoskeletal:  Positive for neck pain.  Neurological:  Positive for headaches. Negative for tingling, weakness and numbness.      Objective    BP 125/85   Pulse 88   Temp (!) 97.3 F (36.3 C) (Oral)   Resp 16   Wt 184 lb 3.2 oz (83.6 kg)   SpO2 98%   BMI 28.01 kg/m  {Show previous vital signs  (optional):23777}  Physical Exam Vitals and nursing note reviewed.  Constitutional:      Appearance: Normal appearance. He is overweight.  HENT:     Head: Normocephalic and atraumatic.     Right Ear: Tympanic membrane, ear canal and external ear normal. There is no impacted cerumen.     Left Ear: Tympanic membrane, ear canal and external ear normal. There is no impacted cerumen.     Nose: Nose normal. No congestion or rhinorrhea.     Mouth/Throat:     Mouth: Mucous membranes are moist.     Pharynx: Oropharynx is clear. Posterior oropharyngeal erythema present. No oropharyngeal exudate.  Eyes:     Pupils: Pupils are equal, round, and reactive to light.  Neck:     Thyroid: No thyroid mass, thyromegaly or thyroid tenderness.  Cardiovascular:     Rate and Rhythm: Normal rate and regular rhythm.     Pulses: Normal pulses.     Heart sounds: Normal heart sounds.  Pulmonary:     Effort: Pulmonary effort is normal. No respiratory distress.     Breath sounds: Normal breath sounds. No stridor. No wheezing, rhonchi or rales.  Chest:     Chest wall: No tenderness.  Musculoskeletal:        General: Normal range of motion.     Cervical back: Normal range of motion and neck supple. Muscular tenderness present.  Lymphadenopathy:  Cervical: Cervical adenopathy present.     Right cervical: Superficial cervical adenopathy, deep cervical adenopathy and posterior cervical adenopathy present.     Left cervical: Superficial cervical adenopathy, deep cervical adenopathy and posterior cervical adenopathy present.  Skin:    General: Skin is warm and dry.     Capillary Refill: Capillary refill takes less than 2 seconds.  Neurological:     General: No focal deficit present.     Mental Status: He is alert and oriented to person, place, and time. Mental status is at baseline.  Psychiatric:        Mood and Affect: Mood normal.        Behavior: Behavior normal.        Thought Content: Thought content  normal.        Judgment: Judgment normal.     No results found for any visits on 12/06/20.  Assessment & Plan     Problem List Items Addressed This Visit       Cardiovascular and Mediastinum   Bundle branch block, right    New complete R BBB- referral to cards      Relevant Orders   Ambulatory referral to Cardiology     Musculoskeletal and Integument   Chalazion left lower eyelid    Chronic; request for medication refill      Relevant Medications   erythromycin ophthalmic ointment     Other   Jaw pain, non-TMJ - Primary    Trial of 14 day muscle relaxant Encouraged to use dental guard if available  Pt encouraged to sleep upright if able to decrease swelling Encouraged to use flonase 2x/day- pt current report of use 1x/day      Relevant Orders   EKG 12-Lead (Completed)     Return if symptoms worsen or fail to improve.     Vonna Kotyk, FNP, have reviewed all documentation for this visit. The documentation on 12/06/20 for the exam, diagnosis, procedures, and orders are all accurate and complete.    Gwyneth Sprout, [place] [phone] (phone) [phone] (fax)  [place]

## 2020-12-06 NOTE — Assessment & Plan Note (Signed)
New complete R BBB- referral to cards

## 2020-12-07 LAB — PSA: Prostate Specific Ag, Serum: 1 ng/mL (ref 0.0–4.0)

## 2020-12-08 ENCOUNTER — Encounter: Payer: Self-pay | Admitting: Urology

## 2020-12-08 ENCOUNTER — Other Ambulatory Visit: Payer: Self-pay

## 2020-12-08 ENCOUNTER — Ambulatory Visit: Payer: PPO | Admitting: Urology

## 2020-12-08 VITALS — BP 126/76 | HR 93 | Ht 68.0 in | Wt 184.0 lb

## 2020-12-08 DIAGNOSIS — Z125 Encounter for screening for malignant neoplasm of prostate: Secondary | ICD-10-CM | POA: Diagnosis not present

## 2020-12-08 DIAGNOSIS — N400 Enlarged prostate without lower urinary tract symptoms: Secondary | ICD-10-CM | POA: Diagnosis not present

## 2020-12-08 LAB — BLADDER SCAN AMB NON-IMAGING

## 2020-12-08 NOTE — Progress Notes (Signed)
   12/08/2020 1:59 PM   Benjamin Gutierrez 05-Jan-1951 [id number]  Reason for visit: Follow up BPH, recurrent UTI, PSA screening   HPI: He is a relatively healthy 70 year old male with a family history of prostate cancer in his brother, who we have been following for the above issues.  In terms of his PSA screening, PSA has been stable at ~1 for the last few years, and was most recently 1 in October 2022.  We previously reviewed the AUA guidelines regarding recommendations against routine screening in men over age 73, however with his family history and otherwise good health he would like to continue PSA screening every 1 to 2 years at least for another few years.  Regarding his urinary symptoms he is doing very well and denies any significant urinary complaints today aside from occasional weak stream.  PVR is normal at 36 mL.  He previously was trialed on Flomax in the past and had severe lightheadedness and discontinued this medication.  He has had 2 episodes of epididymitis in the last 5 years, and we discussed that if he were to develop recurrent UTIs we may need to consider cystoscopy or outlet procedures in the future.   RTC 1 year with PSA, PVR  [name], MD  [facility] [address] [place], [place] [address] [phone]

## 2020-12-08 NOTE — Patient Instructions (Signed)
Prostate Cancer Screening Prostate cancer screening is a test that is done to check for the presence of prostate cancer in men. The prostate gland is a walnut-sized gland that is located below the bladder and in front of the rectum in males. The function of the prostate is to add fluid to semen during ejaculation. Prostate cancer is the second most common type of cancer in men. Who should have prostate cancer screening? Screening recommendations vary based on age and other risk factors. Screening is recommended if: You are older than age 40. If you are age 1-69, talk with your health care provider about your need for screening and how often screening should be done. Because most prostate cancers are slow growing and will not cause death, screening is generally reserved in this age group for men who have a 10-15-year life expectancy. You are younger than age 36, and you have these risk factors: Being a [place] male or a male of African descent. Having a father, brother, or uncle who has been diagnosed with prostate cancer. The risk is higher if your family member's cancer occurred at an early age. Screening is not recommended if: You are younger than age 74. You are between the ages of 20 and 65 and you have no risk factors. You are 41 years of age or older. At this age, the risks that screening can cause are greater than the benefits that it may provide. If you are at high risk for prostate cancer, your health care provider may recommend that you have screenings more often or that you start screening at a younger age. How is screening for prostate cancer done? The recommended prostate cancer screening test is a blood test called the prostate-specific antigen (PSA) test. PSA is a protein that is made in the prostate. As you age, your prostate naturally produces more PSA. Abnormally high PSA levels may be caused by: Prostate cancer. An enlarged prostate that is not caused by cancer (benign prostatic  hyperplasia, BPH). This condition is very common in older men. A prostate gland infection (prostatitis). Depending on the PSA results, you may need more tests, such as: A physical exam to check the size of your prostate gland. Blood and imaging tests. A procedure to remove tissue samples from your prostate gland for testing (biopsy). What are the benefits of prostate cancer screening? Screening can help to identify cancer at an early stage, before symptoms start and when the cancer can be treated more easily. There is a small chance that screening may lower your risk of dying from prostate cancer. The chance is small because prostate cancer is a slow-growing cancer, and most men with prostate cancer die from a different cause. What are the risks of prostate cancer screening? The main risk of prostate cancer screening is diagnosing and treating prostate cancer that would never have caused any symptoms or problems. This is called overdiagnosisand overtreatment. PSA screening cannot tell you if your PSA is high due to cancer or a different cause. A prostate biopsy is the only procedure to diagnose prostate cancer. Even the results of a biopsy may not tell you if your cancer needs to be treated. Slow-growing prostate cancer may not need any treatment other than monitoring, so diagnosing and treating it may cause unnecessary stress or other side effects. Questions to ask your health care provider When should I start prostate cancer screening? What is my risk for prostate cancer? How often do I need screening? What type of screening tests do  I need? How do I get my test results? What do my results mean? Do I need treatment? Where to find more information The American Cancer Society: www.cancer.org American Urological Association: www.auanet.org Contact a health care provider if: You have difficulty urinating. You have pain when you urinate or ejaculate. You have blood in your urine or semen. You  have pain in your back or in the area of your prostate. Summary Prostate cancer is a common type of cancer in men. The prostate gland is located below the bladder and in front of the rectum. This gland adds fluid to semen during ejaculation. Prostate cancer screening may identify cancer at an early stage, when the cancer can be treated more easily. The prostate-specific antigen (PSA) test is the recommended screening test for prostate cancer. Discuss the risks and benefits of prostate cancer screening with your health care provider. If you are age 48 or older, the risks that screening can cause are greater than the benefits that it may provide. This information is not intended to replace advice given to you by your health care provider. Make sure you discuss any questions you have with your health care provider. Document Revised: 04/08/2020 Document Reviewed: 09/18/2018 Elsevier Patient Education  [place].

## 2020-12-09 DIAGNOSIS — X32XXXA Exposure to sunlight, initial encounter: Secondary | ICD-10-CM | POA: Diagnosis not present

## 2020-12-09 DIAGNOSIS — L570 Actinic keratosis: Secondary | ICD-10-CM | POA: Diagnosis not present

## 2021-01-01 NOTE — Progress Notes (Signed)
Cardiology Office Note  Date:  01/02/2021   ID:  Benjamin Gutierrez, DOB [birth year], MRN [id number]  PCP:  Margo Common, PA-C (Inactive)   Chief Complaint  Patient presents with   New Patient (Initial Visit)    Ref by Tally Joe, NP for RBBB, cardiac hx-[place] 2018. Patient c/o shortness of breath with exertion. Medications reviewed by the patient verbally.     HPI:  Benjamin Gutierrez is a 70 year old gentleman with past medical history of 3 neck surgeries, [place] neurosurgery Non smoking hx No diabetes tremor Referred by Tally Joe for RBBB, SOB  GEN reports he is active, denies any symptoms of chest pain on exertion Seen for preop eval by Dr. 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  PSH:    Past Surgical History:  Procedure Laterality Date   [place] SURGERY  2009   radiculopathy from past fusion and arthritic changes   COLONOSCOPY  2013   COLONOSCOPY WITH PROPOFOL N/A 06/07/2020   Procedure: COLONOSCOPY WITH PROPOFOL;  Surgeon: Lucilla Lame, MD;  [facility] END[facility]COPY;  Service: Endoscopy;  Laterality: N/A;   ESOPHAGOGASTRODUODENOSCOPY ENDOSCOPY     EYE SURGERY  01/02/2018   right eye lid mole  removed   LAMINECTOMY  2005   PAROTIDECTOMY Left 12/13/2016   Procedure: TOTAL PAROTIDECTOMY WITH FACIAL NERVE MONITORING;  Surgeon: Carloyn Manner, MD;  Location: [facility] ORS;  Service: ENT;  Laterality: Left;    Current Outpatient Medications  Medication Sig Dispense Refill   acetaminophen (TYLENOL) 500 MG tablet Take 500 mg by mouth every 6 (six) hours as needed.     DEXILANT 60 MG capsule TAKE 1 CAPSULE BY MOUTH ONCE DAILY 90 capsule 1   ibuprofen (ADVIL,MOTRIN) 200 MG tablet Take 200 mg by mouth every 6 (six) hours as needed.     naproxen (EC NAPROSYN) 500 MG EC tablet Take 500 mg by mouth as needed.      erythromycin ophthalmic ointment Place 1 application into the left eye at bedtime. (Patient not taking: Reported on 01/02/2021) 3.5 g 0   metoprolol tartrate (LOPRESSOR) 25 MG tablet TAKE 1 TABLET BY MOUTH TWICE A DAY (Patient not taking: Reported on 01/02/2021) 240 tablet 1   predniSONE (DELTASONE) 20 MG tablet Take 1 tablet (20 mg total) by mouth daily with breakfast. (Patient not taking: Reported on 01/02/2021) 5 tablet 0   No current facility-administered medications for this visit.     Allergies:   Betadine [povidone iodine], Fentanyl, and Penicillins   Social History:  The patient  reports that he has never smoked. He has never used smokeless tobacco. He reports that he does not currently use alcohol. He reports that he does not use drugs.   Family History:   family history includes Alcohol abuse in his father; Dementia in his  paternal grandmother; Esophageal cancer in his mother; Parkinson's disease in his maternal uncle; Prostate cancer in his brother.    Review of Systems: Review of Systems  Constitutional: Negative.   HENT: Negative.    Respiratory: Negative.    Cardiovascular: Negative.   Gastrointestinal: Negative.   Musculoskeletal: Negative.   Neurological: Negative.   Psychiatric/Behavioral: Negative.    All other systems reviewed and are  negative.   PHYSICAL EXAM: VS:  BP 130/80 (BP Location: Right Arm, Patient Position: Sitting, Cuff Size: Normal)   Pulse 80   Ht 5\' 8"  (1.727 m)   Wt 186 lb 2 oz (84.4 kg)   SpO2 98%   BMI 28.30 kg/m  , BMI Body mass index is 28.3 kg/m. GEN: Well nourished, well developed, in no acute distress HEENT: normal Neck: no JVD, carotid bruits, or masses Cardiac: RRR; no murmurs, rubs, or gallops,no edema  Respiratory:  clear to auscultation bilaterally, normal work of breathing GI: soft, nontender, nondistended, + BS MS: no deformity or atrophy Skin: warm and dry, no rash Neuro:  Strength and sensation are intact Psych: euthymic mood, full affect  Recent Labs: 02/25/2020: ALT 23; BUN 12; Creatinine, Ser 1.01; Hemoglobin 14.9; Platelets 173; Potassium 4.7; Sodium 141; TSH 2.320    Lipid Panel Lab Results  Component Value Date   CHOL 155 02/25/2020   HDL 51 02/25/2020   LDLCALC 88 02/25/2020   TRIG 83 02/25/2020      Wt Readings from Last 3 Encounters:  01/02/21 186 lb 2 oz (84.4 kg)  12/08/20 184 lb (83.5 kg)  12/06/20 184 lb 3.2 oz (83.6 kg)       ASSESSMENT AND PLAN:  Problem List Items Addressed This Visit   None Visit Diagnoses     SOB (shortness of breath)    -  Primary   Tremor       Abnormal EKG       RBBB          RBBB/ABN EKG Benign finding in the setting of No anginal sx,  active, goes hunting, exercises with his wife No further work-up at this time For anginal symptoms, recommended he call our office for further work-up  Preventive care Nonsmoker, no diabetes, good cholesterol  Rare SOB,  1 episode shortness of breath when dragging heavy branches on his property Likely overexerted himself though unable to exclude exercise-induced asthma Has mild symptoms with overexertion such as long walks when he is hunting Likely conditioning  Tremor Off metoprolol, concerned it may have contributed to shortness of breath episode when  dragging heavy  branches   Total encounter time more than 45 minutes  Greater than 50% was spent in counseling and coordination of care with the patient    Signed, Esmond Plants, M.D., Ph.D. [place], [place]

## 2021-01-02 ENCOUNTER — Other Ambulatory Visit: Payer: Self-pay

## 2021-01-02 ENCOUNTER — Encounter: Payer: Self-pay | Admitting: Cardiovascular Disease

## 2021-01-02 ENCOUNTER — Ambulatory Visit: Payer: PPO | Admitting: Cardiovascular Disease

## 2021-01-02 VITALS — BP 130/80 | HR 80 | Ht 68.0 in | Wt 186.1 lb

## 2021-01-02 DIAGNOSIS — R0602 Shortness of breath: Secondary | ICD-10-CM | POA: Diagnosis not present

## 2021-01-02 DIAGNOSIS — I4510 Unspecified right bundle-branch block: Secondary | ICD-10-CM | POA: Diagnosis not present

## 2021-01-02 DIAGNOSIS — R251 Tremor, unspecified: Secondary | ICD-10-CM | POA: Diagnosis not present

## 2021-01-02 DIAGNOSIS — R9431 Abnormal electrocardiogram [ECG] [EKG]: Secondary | ICD-10-CM | POA: Diagnosis not present

## 2021-01-02 NOTE — Patient Instructions (Addendum)
Medication Instructions:  No changes  If you need a refill on your cardiac medications before your next appointment, please call your pharmacy.   Lab work: No new labs needed  Testing/Procedures: No new testing needed  Follow-Up: At [facility], you and your health needs are our priority.  As part of our continuing mission to provide you with exceptional heart care, we have created designated Provider Care Teams.  These Care Teams include your primary Cardiologist (physician) and Advanced Practice Providers (APPs -  Physician Assistants and Nurse Practitioners) who all work together to provide you with the care you need, when you need it.  You will need a follow up appointment as needed  Providers on your designated Care Team:   Christopher Berge, NP Ryan Dunn, PA-C Cadence Furth, PA-C  COVID-19 Vaccine Information can be found at: [email or web address] For questions related to vaccine distribution or appointments, please email [email or web address] or call [phone].    

## 2021-02-16 ENCOUNTER — Encounter: Payer: Self-pay | Admitting: Urology

## 2021-02-28 ENCOUNTER — Encounter: Payer: Self-pay | Admitting: Family Medicine

## 2021-02-28 ENCOUNTER — Other Ambulatory Visit: Payer: Self-pay

## 2021-02-28 ENCOUNTER — Ambulatory Visit (INDEPENDENT_AMBULATORY_CARE_PROVIDER_SITE_OTHER): Payer: PPO | Admitting: Family Medicine

## 2021-02-28 VITALS — BP 142/82 | HR 87 | Resp 96 | Wt 188.8 lb

## 2021-02-28 DIAGNOSIS — J00 Acute nasopharyngitis [common cold]: Secondary | ICD-10-CM | POA: Insufficient documentation

## 2021-02-28 DIAGNOSIS — G250 Essential tremor: Secondary | ICD-10-CM | POA: Diagnosis not present

## 2021-02-28 DIAGNOSIS — R000 Tachycardia, unspecified: Secondary | ICD-10-CM | POA: Diagnosis not present

## 2021-02-28 DIAGNOSIS — R251 Tremor, unspecified: Secondary | ICD-10-CM | POA: Diagnosis not present

## 2021-02-28 DIAGNOSIS — H938X3 Other specified disorders of ear, bilateral: Secondary | ICD-10-CM | POA: Diagnosis not present

## 2021-02-28 DIAGNOSIS — I10 Essential (primary) hypertension: Secondary | ICD-10-CM | POA: Diagnosis not present

## 2021-02-28 DIAGNOSIS — Z125 Encounter for screening for malignant neoplasm of prostate: Secondary | ICD-10-CM

## 2021-02-28 DIAGNOSIS — Z13220 Encounter for screening for lipoid disorders: Secondary | ICD-10-CM | POA: Diagnosis not present

## 2021-02-28 DIAGNOSIS — Z131 Encounter for screening for diabetes mellitus: Secondary | ICD-10-CM | POA: Diagnosis not present

## 2021-02-28 DIAGNOSIS — Z136 Encounter for screening for cardiovascular disorders: Secondary | ICD-10-CM

## 2021-02-28 DIAGNOSIS — Z0000 Encounter for general adult medical examination without abnormal findings: Secondary | ICD-10-CM | POA: Diagnosis not present

## 2021-02-28 DIAGNOSIS — I4510 Unspecified right bundle-branch block: Secondary | ICD-10-CM | POA: Diagnosis not present

## 2021-02-28 LAB — POCT INFLUENZA A/B
Influenza A, POC: NEGATIVE
Influenza B, POC: NEGATIVE

## 2021-02-28 MED ORDER — HYDROCHLOROTHIAZIDE 25 MG PO TABS: 25.0000 mg | ORAL_TABLET | Freq: Every day | ORAL | 11 refills | Status: DC

## 2021-02-28 MED ORDER — FLUTICASONE PROPIONATE 50 MCG/ACT NA SUSP: 2.0000 | Freq: Every day | NASAL | 6 refills | Status: DC

## 2021-02-28 MED ORDER — METOPROLOL TARTRATE 25 MG PO TABS: 25.0000 mg | ORAL_TABLET | Freq: Two times a day (BID) | ORAL | 3 refills | Status: DC

## 2021-02-28 NOTE — Assessment & Plan Note (Signed)
UTD on dental Needs eye exam- no complaints Things to do to keep yourself healthy  - Exercise at least 30-45 minutes a day, 3-4 days a week.  - Eat a low-fat diet with lots of fruits and vegetables, up to 7-9 servings per day.  - Seatbelts can save your life. Wear them always.  - Smoke detectors on every level of your home, check batteries every year.  - Eye Doctor - have an eye exam every 1-2 years  - Safe sex - if you may be exposed to STDs, use a condom.  - Alcohol -  If you drink, do it moderately, less than 2 drinks per day.  - [place]. Choose someone to speak for you if you are not able.  - Depression is common in our stressful world.If you're feeling down or losing interest in things you normally enjoy, please come in for a visit.  - Violence - If anyone is threatening or hurting you, please call immediately.

## 2021-02-28 NOTE — Assessment & Plan Note (Signed)
Recommend increase in diet of healthier fat choices- low fat meats, oils that are not solid at room temperature, nuts, seeds, fish- cod, halibut, salmon, and avocado. Exercise can also increase this number.  Supplemental omega 3's can be taken as well but are not as helpful as dietary/exercise changes.  

## 2021-02-28 NOTE — Progress Notes (Signed)
Complete physical exam   Patient: Benjamin Gutierrez   DOB: [birth year]   71 y.o. Male  MRN: [id number] Visit Date: 02/28/2021  Today's healthcare provider: Gwyneth Sprout, FNP   Chief Complaint  Patient presents with   Annual Exam   Subjective     HPI  Benjamin Gutierrez is a 71 y.o. male who presents today for a complete physical exam.  He reports consuming a general diet. Home exercise routine includes stretching and walking. He generally feels well. He reports sleeping fairly well. He does have additional problems to discuss today, patient reports congestion and runny nose that has caused difficulty at night sleeping, he states that he has been trying otc Mucinex.   Past Medical History:  Diagnosis Date   Acid reflux 08/06/2014   Anxiety 08/06/2014   Arthritis    Blood in feces 08/06/2014   Brachial neuritis 01/02/2008   Cancer ([place])    forehead   Decreased libido 08/06/2014   Dizziness and giddiness 08/06/2014   ED (erectile dysfunction) of organic origin 08/06/2014   GERD (gastroesophageal reflux disease)    H/O arthrodesis 08/06/2014   Leg varices 08/06/2014   Past Surgical History:  Procedure Laterality Date   [place]  2009   radiculopathy from past fusion and arthritic changes   COLONOSCOPY  2013   COLONOSCOPY WITH PROPOFOL N/A 06/07/2020   Procedure: COLONOSCOPY WITH PROPOFOL;  Surgeon: Lucilla Lame, MD;  Location: [facility] ENDOSCOPY;  Service: Endoscopy;  Laterality: N/A;   ESOPHAGOGASTRODUODENOSCOPY ENDOSCOPY     EYE SURGERY  01/02/2018   right eye lid mole removed   LAMINECTOMY  2005   PAROTIDECTOMY Left 12/13/2016   Procedure: TOTAL PAROTIDECTOMY WITH FACIAL NERVE MONITORING;  Surgeon: Carloyn Manner, MD;  Location: [facility] ORS;  Service: ENT;  Laterality: Left;   Social History   Socioeconomic History   Marital status: Married    Spouse name: Benjamin Gutierrez   Number of children: 2   Years of education: 12   Highest  education level: High school graduate  Occupational History   Occupation: Retired  Tobacco Use   Smoking status: Never   Smokeless tobacco: Never  Scientific laboratory technician Use: Never used  Substance and Sexual Activity   Alcohol use: Not Currently    Alcohol/week: 0.0 standard drinks   Drug use: No   Sexual activity: Yes    Birth control/protection: None  Other Topics Concern   Not on file  Social History Narrative   Not on file   Social Determinants of Health   Financial Resource Strain: Low Risk    Difficulty of Paying Living Expenses: Not hard at all  Food Insecurity: No Food Insecurity   Worried About Charity fundraiser in the Last Year: Never true   [place] in the Last Year: Never true  Transportation Needs: No Transportation Needs   Lack of Transportation (Medical): No   Lack of Transportation (Non-Medical): No  Physical Activity: Insufficiently Active   Days of Exercise per Week: 2 days   Minutes of Exercise per Session: 20 min  Stress: No Stress Concern Present   Feeling of Stress : Not at all  Social Connections: Moderately Integrated   Frequency of Communication with Friends and Family: More than three times a week   Frequency of Social Gatherings with Friends and Family: More than three times a week   Attends Religious Services: More than 4 times  per year   Active Member of Clubs or Organizations: No   Attends Archivist Meetings: Never   Marital Status: Married  Human resources officer Violence: Not At Risk   Fear of Current or Ex-Partner: No   Emotionally Abused: No   Physically Abused: No   Sexually Abused: No   Family Status  Relation Name Status   Mother  Deceased   Father  Deceased   Brother  Alive   Daughter  Alive   Son  Alive   Mat Uncle  Deceased   PGM  Deceased   Family History  Problem Relation Age of Onset   Esophageal cancer Mother    Alcohol abuse Father    Prostate cancer Brother    Parkinson's disease Maternal Uncle     Dementia Paternal Grandmother    Allergies  Allergen Reactions   Betadine [Povidone Iodine] Itching   Fentanyl Other (See Comments)    Pain patch and after passed out and admitted to the hospital   Penicillins Rash    Patient Care Team: Benjamin Sprout, FNP as PCP - General (Family Medicine) Karle Starch, MD as Referring Physician (Ophthalmology) [name], MD as Consulting Physician (Urology) Leandrew Koyanagi, MD as Referring Physician (Ophthalmology) Earnie Larsson, MD as Consulting Physician (Neurosurgery) Dasher, Rayvon Char, MD (Dermatology)   Medications: Outpatient Medications Prior to Visit  Medication Sig   acetaminophen (TYLENOL) 500 MG tablet Take 500 mg by mouth every 6 (six) hours as needed.   DEXILANT 60 MG capsule TAKE 1 CAPSULE BY MOUTH ONCE DAILY   ibuprofen (ADVIL,MOTRIN) 200 MG tablet Take 200 mg by mouth every 6 (six) hours as needed.   naproxen (EC NAPROSYN) 500 MG EC tablet Take 500 mg by mouth as needed.    [DISCONTINUED] metoprolol tartrate (LOPRESSOR) 25 MG tablet TAKE 1 TABLET BY MOUTH TWICE A DAY   [DISCONTINUED] erythromycin ophthalmic ointment Place 1 application into the left eye at bedtime. (Patient not taking: Reported on 01/02/2021)   [DISCONTINUED] predniSONE (DELTASONE) 20 MG tablet Take 1 tablet (20 mg total) by mouth daily with breakfast. (Patient not taking: Reported on 01/02/2021)   No facility-administered medications prior to visit.    Review of Systems  HENT:  Positive for congestion, postnasal drip, rhinorrhea, sinus pressure and sneezing.   All other systems reviewed and are negative.    Objective    BP (!) 142/82    Pulse 87    Resp (!) 96    Wt 188 lb 12.8 oz (85.6 kg)    BMI 28.71 kg/m    Physical Exam Vitals and nursing note reviewed.  Constitutional:      General: He is awake. He is not in acute distress.    Appearance: Normal appearance. He is well-developed and well-groomed. He is not ill-appearing, toxic-appearing  or diaphoretic.  HENT:     Head: Normocephalic and atraumatic.     Jaw: There is normal jaw occlusion. No trismus, tenderness, swelling or pain on movement.     Salivary Glands: Right salivary gland is not diffusely enlarged or tender. Left salivary gland is not diffusely enlarged or tender.     Right Ear: Hearing, tympanic membrane, ear canal and external ear normal. There is no impacted cerumen.     Left Ear: Hearing, tympanic membrane, ear canal and external ear normal. There is no impacted cerumen.     Nose: Congestion present. No rhinorrhea.     Right Turbinates: Not enlarged, swollen or pale.  Left Turbinates: Not enlarged, swollen or pale.     Right Sinus: No maxillary sinus tenderness or frontal sinus tenderness.     Left Sinus: No maxillary sinus tenderness or frontal sinus tenderness.     Mouth/Throat:     Lips: Pink.     Mouth: Mucous membranes are moist. No injury, lacerations, oral lesions or angioedema.     Pharynx: Oropharynx is clear. Uvula midline. No pharyngeal swelling, oropharyngeal exudate or posterior oropharyngeal erythema.     Tonsils: No tonsillar exudate or tonsillar abscesses.  Eyes:     General: Lids are normal. Vision grossly intact. Gaze aligned appropriately.        Right eye: No discharge.        Left eye: No discharge.     Extraocular Movements: Extraocular movements intact.     Conjunctiva/sclera: Conjunctivae normal.     Pupils: Pupils are equal, round, and reactive to light.  Neck:     Thyroid: No thyroid mass, thyromegaly or thyroid tenderness.     Vascular: No carotid bruit.     Trachea: Trachea normal. No tracheal tenderness.  Cardiovascular:     Rate and Rhythm: Normal rate and regular rhythm.     Pulses: Normal pulses.          Carotid pulses are 2+ on the right side and 2+ on the left side.      Radial pulses are 2+ on the right side and 2+ on the left side.       Femoral pulses are 2+ on the right side and 2+ on the left side.       Popliteal pulses are 2+ on the right side and 2+ on the left side.       Dorsalis pedis pulses are 2+ on the right side and 2+ on the left side.       Posterior tibial pulses are 2+ on the right side and 2+ on the left side.     Heart sounds: Normal heart sounds, S1 normal and S2 normal. No murmur heard.   No friction rub. No gallop.  Pulmonary:     Effort: Pulmonary effort is normal. No respiratory distress.     Breath sounds: Normal breath sounds and air entry. No stridor. No wheezing, rhonchi or rales.  Chest:     Chest wall: No tenderness.  Abdominal:     General: Abdomen is flat. Bowel sounds are normal. There is no distension.     Palpations: Abdomen is soft. There is no mass.     Tenderness: There is no abdominal tenderness. There is no guarding or rebound.     Hernia: No hernia is present.  Genitourinary:    Comments: Exam deferred; denies complaints Musculoskeletal:        General: No swelling, tenderness, deformity or signs of injury. Normal range of motion.     Cervical back: Normal range of motion and neck supple. No rigidity or tenderness.     Right lower leg: No edema.     Left lower leg: No edema.  Lymphadenopathy:     Cervical: No cervical adenopathy.     Right cervical: No superficial, deep or posterior cervical adenopathy.    Left cervical: No superficial, deep or posterior cervical adenopathy.  Skin:    General: Skin is warm and dry.     Capillary Refill: Capillary refill takes less than 2 seconds.     Coloration: Skin is not jaundiced or pale.     Findings: No  bruising, erythema, lesion or rash.  Neurological:     General: No focal deficit present.     Mental Status: He is alert and oriented to person, place, and time. Mental status is at baseline.     GCS: GCS eye subscore is 4. GCS verbal subscore is 5. GCS motor subscore is 6.     Sensory: Sensation is intact. No sensory deficit.     Motor: Motor function is intact. No weakness.     Coordination:  Coordination is intact.     Gait: Gait is intact.  Psychiatric:        Attention and Perception: Attention and perception normal.        Mood and Affect: Mood and affect normal.        Speech: Speech normal.        Behavior: Behavior normal. Behavior is cooperative.        Thought Content: Thought content normal.        Cognition and Memory: Cognition normal.        Judgment: Judgment normal.     Last depression screening scores PHQ 2/9 Scores 02/28/2021 05/17/2020 02/25/2020  PHQ - 2 Score 0 0 0  PHQ- 9 Score 0 - -   Last fall risk screening Fall Risk  05/17/2020  Falls in the past year? 0  Comment -  Number falls in past yr: 0  Injury with Fall? 0   Last Audit-C alcohol use screening Alcohol Use Disorder Test (AUDIT) 02/28/2021  1. How often do you have a drink containing alcohol? 0  2. How many drinks containing alcohol do you have on a typical day when you are drinking? 0  3. How often do you have six or more drinks on one occasion? 0  AUDIT-C Score 0  Alcohol Brief Interventions/Follow-up -   A score of 3 or more in women, and 4 or more in men indicates increased risk for alcohol abuse, EXCEPT if all of the points are from question 1   Results for orders placed or performed in visit on 02/28/21  POCT Influenza A/B  Result Value Ref Range   Influenza A, POC Negative Negative   Influenza B, POC Negative Negative    Assessment & Plan    Routine Health Maintenance and Physical Exam  Exercise Activities and Dietary recommendations  Goals      DIET - INCREASE WATER INTAKE     Recommend to drink at least 6-8 8oz glasses of water per day.        Immunization History  Administered Date(s) Administered   H1N1 01/02/2008   Influenza, High Dose Seasonal PF 11/14/2017, 10/21/2019, 11/30/2020   Influenza-Unspecified 11/22/2015, 12/04/2016, 11/30/2020   Moderna Sars-Covid-2 Vaccination 04/03/2019, 05/02/2019, 01/07/2020   Pneumococcal Conjugate-13 05/28/2014    Pneumococcal Polysaccharide-23 11/22/2015   Td 11/22/2004   Tdap 10/10/2010   Zoster Recombinat (Shingrix) 08/28/2017   Zoster, Live 09/01/2010, 12/25/2012    Health Maintenance  Topic Date Due   Zoster Vaccines- Shingrix (2 of 2) 10/23/2017   COVID-19 Vaccine (4 - Booster for Moderna series) 03/03/2020   TETANUS/TDAP  10/09/2020   COLONOSCOPY (Pts 45-37yrs Insurance coverage will need to be confirmed)  06/08/2027   Pneumonia Vaccine 70+ Years old  Completed   INFLUENZA VACCINE  Completed   Hepatitis C Screening  Completed   HPV VACCINES  Aged Out    Discussed health benefits of physical activity, and encouraged him to engage in regular exercise appropriate for his age and condition.  Problem List Items Addressed This Visit       Cardiovascular and Mediastinum   Bundle branch block, right    Completed Cards referral; holding referral to EP at this time Continue BB for tremor No further electrical concerns       Relevant Medications   metoprolol tartrate (LOPRESSOR) 25 MG tablet   hydrochlorothiazide (HYDRODIURIL) 25 MG tablet   Primary hypertension    Chronic, elevated- likely d/t congestion and lack of sleep RTC in 1 month to check BP with nursing staff to ensure BP has come down      Relevant Medications   metoprolol tartrate (LOPRESSOR) 25 MG tablet   hydrochlorothiazide (HYDRODIURIL) 25 MG tablet     Respiratory   Acute nasopharyngitis    Discussed likely viral etiology Negative flu swab today in office Has completed two, negative COVID swabs No sick contacts No hx of allergies       Relevant Orders   POCT Influenza A/B (Completed)     Nervous and Auditory   Benign essential tremor    Continue BB to assist; refused additional work up with neurology at this time Does not impact ADLs        Other   Congestion of both ears    Normal exam; encourage nasal steroids to assist with decrease inflammation If failure to respond- can later do prednisone  burst  Advised use of Abx after >14 days with associated symptoms      Relevant Medications   fluticasone (FLONASE) 50 MCG/ACT nasal spray   hydrochlorothiazide (HYDRODIURIL) 25 MG tablet   Other Relevant Orders   POCT Influenza A/B (Completed)   Encounter for lipid screening for cardiovascular disease    Recommend increase in diet of healthier fat choices- low fat meats, oils that are not solid at room temperature, nuts, seeds, fish- cod, halibut, salmon, and avocado. Exercise can also increase this number.  Supplemental omega 3's can be taken as well but are not as helpful as dietary/exercise changes.       Relevant Orders   Lipid panel   Tachycardia    Stable with use of BB- HR in 80s Denies palpitations       Relevant Medications   metoprolol tartrate (LOPRESSOR) 25 MG tablet   Tremor of both hands    Chronic, stable Plan to refer to neuro when pt allows      Relevant Medications   metoprolol tartrate (LOPRESSOR) 25 MG tablet   Screening for prostate cancer    1 void/night; failed flomax trial with urology in past Repeat PSA      Relevant Orders   PSA   Screening for diabetes mellitus    Continue to recommend balanced, lower carb meals. Smaller meal size, adding snacks. Choosing water as drink of choice and increasing purposeful exercise.       Relevant Orders   Hemoglobin A1c   Annual physical exam - Primary    UTD on dental Needs eye exam- no complaints Things to do to keep yourself healthy  - Exercise at least 30-45 minutes a day, 3-4 days a week.  - Eat a low-fat diet with lots of fruits and vegetables, up to 7-9 servings per day.  - Seatbelts can save your life. Wear them always.  - Smoke detectors on every level of your home, check batteries every year.  - Eye Doctor - have an eye exam every 1-2 years  - Safe sex - if you may be exposed to STDs, use a  condom.  - Alcohol -  If you drink, do it moderately, less than 2 drinks per day.  - [place]. Choose someone to speak for you if you are not able.  - Depression is common in our stressful world.If you're feeling down or losing interest in things you normally enjoy, please come in for a visit.  - Violence - If anyone is threatening or hurting you, please call immediately.        Relevant Orders   CBC with Differential/Platelet   Comprehensive metabolic panel     Return in about 4 weeks (around 03/28/2021) for blood pressure check, nurse follow up.     Vonna Kotyk, FNP, have reviewed all documentation for this visit. The documentation on 02/28/21 for the exam, diagnosis, procedures, and orders are all accurate and complete.    Benjamin Gutierrez, [place] [phone] (phone) [phone] (fax)  [place]

## 2021-02-28 NOTE — Patient Instructions (Signed)
Saw palmetto- natural medication for BPH, benign enlarged prostate

## 2021-02-28 NOTE — Assessment & Plan Note (Signed)
Discussed likely viral etiology Negative flu swab today in office Has completed two, negative COVID swabs No sick contacts No hx of allergies

## 2021-02-28 NOTE — Assessment & Plan Note (Signed)
Completed Cards referral; holding referral to EP at this time Continue BB for tremor No further electrical concerns

## 2021-02-28 NOTE — Assessment & Plan Note (Signed)
Chronic, elevated- likely d/t congestion and lack of sleep RTC in 1 month to check BP with nursing staff to ensure BP has come down

## 2021-02-28 NOTE — Assessment & Plan Note (Signed)
Continue BB to assist; refused additional work up with neurology at this time Does not impact ADLs

## 2021-02-28 NOTE — Assessment & Plan Note (Signed)
Continue to recommend balanced, lower carb meals. Smaller meal size, adding snacks. Choosing water as drink of choice and increasing purposeful exercise.  

## 2021-02-28 NOTE — Assessment & Plan Note (Signed)
Stable with use of BB- HR in 80s Denies palpitations

## 2021-02-28 NOTE — Assessment & Plan Note (Signed)
Normal exam; encourage nasal steroids to assist with decrease inflammation If failure to respond- can later do prednisone burst  Advised use of Abx after >14 days with associated symptoms

## 2021-02-28 NOTE — Assessment & Plan Note (Signed)
Chronic, stable Plan to refer to neuro when pt allows

## 2021-02-28 NOTE — Assessment & Plan Note (Signed)
1 void/night; failed flomax trial with urology in past Repeat PSA

## 2021-03-01 LAB — CBC WITH DIFFERENTIAL/PLATELET
Basophils Absolute: 0.1 10*3/uL (ref 0.0–0.2)
Basos: 1 %
EOS (ABSOLUTE): 0.1 10*3/uL (ref 0.0–0.4)
Eos: 2 %
Hematocrit: 45.3 % (ref 37.5–51.0)
Hemoglobin: 15.6 g/dL (ref 13.0–17.7)
Immature Grans (Abs): 0 10*3/uL (ref 0.0–0.1)
Immature Granulocytes: 0 %
Lymphocytes Absolute: 2.4 10*3/uL (ref 0.7–3.1)
Lymphs: 31 %
MCH: 30.4 pg (ref 26.6–33.0)
MCHC: 34.4 g/dL (ref 31.5–35.7)
MCV: 88 fL (ref 79–97)
Monocytes Absolute: 0.9 10*3/uL (ref 0.1–0.9)
Monocytes: 11 %
Neutrophils Absolute: 4.4 10*3/uL (ref 1.4–7.0)
Neutrophils: 55 %
Platelets: 186 10*3/uL (ref 150–450)
RBC: 5.14 x10E6/uL (ref 4.14–5.80)
RDW: 12.5 % (ref 11.6–15.4)
WBC: 7.9 10*3/uL (ref 3.4–10.8)

## 2021-03-01 LAB — LIPID PANEL
Chol/HDL Ratio: 3.2 ratio (ref 0.0–5.0)
Cholesterol, Total: 154 mg/dL (ref 100–199)
HDL: 48 mg/dL (ref >39)
LDL Chol Calc (NIH): 93 mg/dL (ref 0–99)
Triglycerides: 68 mg/dL (ref 0–149)
VLDL Cholesterol Cal: 13 mg/dL (ref 5–40)

## 2021-03-01 LAB — COMPREHENSIVE METABOLIC PANEL
ALT: 17 IU/L (ref 0–44)
AST: 20 IU/L (ref 0–40)
Albumin/Globulin Ratio: 1.8 (ref 1.2–2.2)
Albumin: 4.4 g/dL (ref 3.7–4.7)
Alkaline Phosphatase: 69 IU/L (ref 44–121)
BUN/Creatinine Ratio: 12 (ref 10–24)
BUN: 14 mg/dL (ref 8–27)
Bilirubin Total: 0.7 mg/dL (ref 0.0–1.2)
CO2: 27 mmol/L (ref 20–29)
Calcium: 9.3 mg/dL (ref 8.6–10.2)
Chloride: 104 mmol/L (ref 96–106)
Creatinine, Ser: 1.13 mg/dL (ref 0.76–1.27)
Globulin, Total: 2.4 g/dL (ref 1.5–4.5)
Glucose: 97 mg/dL (ref 70–99)
Potassium: 4.4 mmol/L (ref 3.5–5.2)
Sodium: 142 mmol/L (ref 134–144)
Total Protein: 6.8 g/dL (ref 6.0–8.5)
eGFR: 69 mL/min/{1.73_m2} (ref >59)

## 2021-03-01 LAB — PSA: Prostate Specific Ag, Serum: 1 ng/mL (ref 0.0–4.0)

## 2021-03-01 LAB — HEMOGLOBIN A1C
Est. average glucose Bld gHb Est-mCnc: 111 mg/dL
Hgb A1c MFr Bld: 5.5 % (ref 4.8–5.6)

## 2021-03-31 ENCOUNTER — Encounter: Payer: Self-pay | Admitting: *Deleted

## 2021-03-31 ENCOUNTER — Encounter: Payer: Self-pay | Admitting: Family Medicine

## 2021-04-27 DIAGNOSIS — L570 Actinic keratosis: Secondary | ICD-10-CM | POA: Diagnosis not present

## 2021-05-08 DIAGNOSIS — D225 Melanocytic nevi of trunk: Secondary | ICD-10-CM | POA: Diagnosis not present

## 2021-05-08 DIAGNOSIS — Z85828 Personal history of other malignant neoplasm of skin: Secondary | ICD-10-CM | POA: Diagnosis not present

## 2021-05-08 DIAGNOSIS — L570 Actinic keratosis: Secondary | ICD-10-CM | POA: Diagnosis not present

## 2021-05-08 DIAGNOSIS — D2262 Melanocytic nevi of left upper limb, including shoulder: Secondary | ICD-10-CM | POA: Diagnosis not present

## 2021-05-08 DIAGNOSIS — L814 Other melanin hyperpigmentation: Secondary | ICD-10-CM | POA: Diagnosis not present

## 2021-05-08 DIAGNOSIS — D2261 Melanocytic nevi of right upper limb, including shoulder: Secondary | ICD-10-CM | POA: Diagnosis not present

## 2021-05-23 ENCOUNTER — Ambulatory Visit (INDEPENDENT_AMBULATORY_CARE_PROVIDER_SITE_OTHER): Payer: PPO

## 2021-05-23 VITALS — BP 130/90 | HR 61 | Temp 98.2°F | Ht 68.0 in | Wt 192.7 lb

## 2021-05-23 DIAGNOSIS — Z0000 Encounter for general adult medical examination without abnormal findings: Secondary | ICD-10-CM

## 2021-05-23 NOTE — Progress Notes (Signed)
? ?Subjective:  ? Benjamin Gutierrez is a 71 y.o. male who presents for Medicare Annual/Subsequent preventive examination. ? ?Review of Systems    ? ?  ? ?   ?Objective:  ?  ?Today's Vitals  ? 05/23/21 1056  ?BP: 130/90  ?Pulse: 61  ?Temp: 98.2 ?F (36.8 ?C)  ?TempSrc: Skin  ?SpO2: 96%  ?Weight: 192 lb 11.2 oz (87.4 kg)  ?Height: '5\' 8"'$  (1.727 m)  ? ?Body mass index is 29.3 kg/m?. ? ? ?  06/07/2020  ?  7:54 AM 05/17/2020  ?  1:44 PM 10/18/2017  ? 10:41 AM 12/13/2016  ?  1:31 PM 12/13/2016  ?  1:25 PM 12/13/2016  ?  6:20 AM 12/07/2016  ? 11:18 AM  ?Advanced Directives  ?Does Patient Have a Medical Advance Directive? Yes Yes Yes No No No No  ?Type of Paramedic of [place];Living will [place];Living will [place];Living will      ?Copy of [place] in Chart? No - copy requested No - copy requested No - copy requested      ?Would patient like information on creating a medical advance directive?    No - Patient declined No - Patient declined No - Patient declined Yes (MAU/Ambulatory/Procedural Areas - Information given)  ? ? ?Current Medications (verified) ?Outpatient Encounter Medications as of 05/23/2021  ?Medication Sig  ? acetaminophen (TYLENOL) 500 MG tablet Take 500 mg by mouth every 6 (six) hours as needed.  ? DEXILANT 60 MG capsule TAKE 1 CAPSULE BY MOUTH ONCE DAILY  ? fluticasone (FLONASE) 50 MCG/ACT nasal spray Place 2 sprays into both nostrils daily.  ? hydrochlorothiazide (HYDRODIURIL) 25 MG tablet Take 1 tablet (25 mg total) by mouth daily.  ? ibuprofen (ADVIL,MOTRIN) 200 MG tablet Take 200 mg by mouth every 6 (six) hours as needed.  ? metoprolol tartrate (LOPRESSOR) 25 MG tablet Take 1 tablet (25 mg total) by mouth 2 (two) times daily.  ? naproxen (EC NAPROSYN) 500 MG EC tablet Take 500 mg by mouth as needed.   ? ?No facility-administered encounter medications on file as of 05/23/2021.  ? ? ?Allergies (verified) ?Betadine [povidone  iodine], Fentanyl, and Penicillins  ? ?History: ?Past Medical History:  ?Diagnosis Date  ? Acid reflux 08/06/2014  ? Anxiety 08/06/2014  ? Arthritis   ? Blood in feces 08/06/2014  ? Brachial neuritis 01/02/2008  ? [facility])   ? forehead  ? Decreased libido 08/06/2014  ? Dizziness and giddiness 08/06/2014  ? ED (erectile dysfunction) of organic origin 08/06/2014  ? GERD (gastroesophageal reflux disease)   ? H/O arthrodesis 08/06/2014  ? Leg varices 08/06/2014  ? ?Past Surgical History:  ?Procedure Laterality Date  ? CARDIAC CATHETERIZATION  1993  ? CERVICAL SPINE SURGERY  2009  ? radiculopathy from past fusion and arthritic changes  ? COLONOSCOPY  2013  ? COLONOSCOPY WITH PROPOFOL N/A 06/07/2020  ? Procedure: COLONOSCOPY WITH PROPOFOL;  Surgeon: Lucilla Lame, MD;  Location: [facility] ENDOSCOPY;  Service: Endoscopy;  Laterality: N/A;  ? ESOPHAGOGASTRODUODENOSCOPY ENDOSCOPY    ? EYE SURGERY  01/02/2018  ? right eye lid mole removed  ? LAMINECTOMY  2005  ? PAROTIDECTOMY Left 12/13/2016  ? Procedure: TOTAL PAROTIDECTOMY WITH FACIAL NERVE MONITORING;  Surgeon: Carloyn Manner, MD;  Location: [facility] ORS;  Service: ENT;  Laterality: Left;  ? ?Family History  ?Problem Relation Age of Onset  ? Esophageal cancer Mother   ? Alcohol abuse Father   ?  Prostate cancer Brother   ? Parkinson's disease Maternal Uncle   ? Dementia Paternal Grandmother   ? ?Social History  ? ?Socioeconomic History  ? Marital status: Married  ?  Spouse name: Bethena Roys  ? Number of children: 2  ? Years of education: 33  ? Highest education level: High school graduate  ?Occupational History  ? Occupation: Retired  ?Tobacco Use  ? Smoking status: Never  ? Smokeless tobacco: Never  ?Vaping Use  ? Vaping Use: Never used  ?Substance and Sexual Activity  ? Alcohol use: Not Currently  ?  Alcohol/week: 0.0 standard drinks  ? Drug use: No  ? Sexual activity: Yes  ?  Birth control/protection: None  ?Other Topics Concern  ? Not on file  ?Social History Narrative  ? Not on file   ? ?Social Determinants of Health  ? ?Financial Resource Strain: Not on file  ?Food Insecurity: Not on file  ?Transportation Needs: Not on file  ?Physical Activity: Not on file  ?Stress: Not on file  ?Social Connections: Not on file  ? ? ?Tobacco Counseling ?Counseling given: Not Answered ? ? ?Clinical Intake: ? ?Pre-visit preparation completed: Yes ? ?Pain : No/denies pain ? ?  ? ?Nutritional Risks: None ?Diabetes: No ? ?How often do you need to have someone help you when you read instructions, pamphlets, or other written materials from your doctor or pharmacy?: 1 - Never ? ?Diabetic?no ? ?Interpreter Needed?: No ? ?Information entered by :: Kirke Shaggy, LPN ? ? ?Activities of Daily Living ? ?  05/22/2021  ? 12:32 PM 02/28/2021  ?  9:37 AM  ?In your present state of health, do you have any difficulty performing the following activities:  ?Hearing? 0 1  ?Vision? 0 0  ?Difficulty concentrating or making decisions? 0 0  ?Walking or climbing stairs? 0 0  ?Dressing or bathing? 0 0  ?Doing errands, shopping? 0 0  ?Preparing Food and eating ? N   ?Using the Toilet? N   ?In the past six months, have you accidently leaked urine? N   ?Do you have problems with loss of bowel control? N   ?Managing your Medications? N   ?Managing your Finances? N   ?Housekeeping or managing your Housekeeping? N   ? ? ?Patient Care Team: ?Gwyneth Sprout, FNP as PCP - General (Family Medicine) ?Karle Starch, MD as Referring Physician (Ophthalmology) ?[name], MD as Consulting Physician (Urology) ?Leandrew Koyanagi, MD as Referring Physician (Ophthalmology) ?Earnie Larsson, MD as Consulting Physician (Neurosurgery) ?Dasher, Rayvon Char, MD (Dermatology) ? ?Indicate any recent Medical Services you may have received from other than [facility] providers in the past year (date may be approximate). ? ?   ?Assessment:  ? This is a routine wellness examination for Abdulhamid. ? ?Hearing/Vision screen ?No results found. ? ?Dietary issues and exercise  activities discussed: ?  ? ? Goals Addressed   ?None ?  ? ?Depression Screen ? ?  02/28/2021  ?  9:37 AM 05/17/2020  ?  1:42 PM 02/25/2020  ?  9:46 AM 01/14/2018  ?  8:15 AM 11/14/2017  ?  9:41 AM 10/18/2017  ? 10:52 AM 10/18/2017  ? 10:41 AM  ?PHQ 2/9 Scores  ?PHQ - 2 Score 0 0 0 0 0 0 0  ?PHQ- 9 Score 0    0 0   ?  ?Fall Risk ? ?  05/22/2021  ? 12:32 PM 05/17/2020  ?  1:45 PM 02/25/2020  ?  9:46 AM 09/15/2019  ?  2:06 PM 01/14/2018  ?  8:15 AM  ?Fall Risk   ?Falls in the past year? 0 0 0 0 0  ?Comment    [facility] Telephone Survey: data to providers prior to load   ?Number falls in past yr:  0 0    ?Injury with Fall?  0 0    ? ? ?FALL RISK PREVENTION PERTAINING TO THE HOME: ? ?Any stairs in or around the home? Yes  ?If so, are there any without handrails? No  ?Home free of loose throw rugs in walkways, pet beds, electrical cords, etc? Yes  ?Adequate lighting in your home to reduce risk of falls? Yes  ? ?ASSISTIVE DEVICES UTILIZED TO PREVENT FALLS: ? ?Life alert? No  ?Use of a cane, walker or w/c? No  ?Grab bars in the bathroom? No  ?Shower chair or bench in shower? No  ?Elevated toilet seat or a handicapped toilet? Yes  ? ?TIMED UP AND GO: ? ?Was the test performed? Yes .  ?Length of time to ambulate 10 feet: 4 sec.  ? ?Gait steady and fast without use of assistive device ? ?Cognitive Function: ?  ?  ? ?  10/18/2017  ? 10:45 AM  ?6CIT Screen  ?What Year? 0 points  ?What month? 0 points  ?What time? 0 points  ?Count back from 20 0 points  ?Months in reverse 0 points  ?Repeat phrase 0 points  ?Total Score 0 points  ? ? ?Immunizations ?Immunization History  ?Administered Date(s) Administered  ? H1N1 01/02/2008  ? Influenza, High Dose Seasonal PF 11/14/2017, 10/21/2019, 11/30/2020  ? Influenza-Unspecified 11/22/2015, 12/04/2016, 11/30/2020  ? Moderna Sars-Covid-2 Vaccination 04/03/2019, 05/02/2019, 01/07/2020  ? Pneumococcal Conjugate-13 05/28/2014  ? Pneumococcal Polysaccharide-23 11/22/2015  ? Td 11/22/2004  ? Tdap 10/10/2010  ?  Zoster Recombinat (Shingrix) 08/28/2017  ? Zoster, Live 09/01/2010, 12/25/2012  ? ? ?TDAP status: Due, Education has been provided regarding the importance of this vaccine. Advised may receive this vaccine at local ph

## 2021-05-23 NOTE — Patient Instructions (Addendum)
Benjamin Gutierrez , ?Thank you for taking time to come for your Medicare Wellness Visit. I appreciate your ongoing commitment to your health goals. Please review the following plan we discussed and let me know if I can assist you in the future.  ? ?Screening recommendations/referrals: ?Colonoscopy: 06/07/20 ?Recommended yearly ophthalmology/optometry visit for glaucoma screening and checkup ?Recommended yearly dental visit for hygiene and checkup ? ?Vaccinations: ?Influenza vaccine: 11/30/20 ?Pneumococcal vaccine: 11/22/15 ?Tdap vaccine: 10/10/10, due ?Shingles vaccine: Zostavax 12/25/12       Shingrix 08/28/17  needs 2nd shot ?Covid-19: 04/03/19, 05/02/19, 01/07/20, 03/20/21 ? ?Advanced directives: no ? ?Conditions/risks identified: none ? ?Next appointment: Follow up in one year for your annual wellness visit. - 05/29/22 @ 2pm in person ? ?Preventive Care 4 Years and Older, Male ?Preventive care refers to lifestyle choices and visits with your health care provider that can promote health and wellness. ?What does preventive care include? ?A yearly physical exam. This is also called an annual well check. ?Dental exams once or twice a year. ?Routine eye exams. Ask your health care provider how often you should have your eyes checked. ?Personal lifestyle choices, including: ?Daily care of your teeth and gums. ?Regular physical activity. ?Eating a healthy diet. ?Avoiding tobacco and drug use. ?Limiting alcohol use. ?Practicing safe sex. ?Taking low doses of aspirin every day. ?Taking vitamin and mineral supplements as recommended by your health care provider. ?What happens during an annual well check? ?The services and screenings done by your health care provider during your annual well check will depend on your age, overall health, lifestyle risk factors, and family history of disease. ?Counseling  ?Your health care provider may ask you questions about your: ?Alcohol use. ?Tobacco use. ?Drug use. ?Emotional well-being. ?Home and  relationship well-being. ?Sexual activity. ?Eating habits. ?History of falls. ?Memory and ability to understand (cognition). ?Work and work Statistician. ?Screening  ?You may have the following tests or measurements: ?Height, weight, and BMI. ?Blood pressure. ?Lipid and cholesterol levels. These may be checked every 5 years, or more frequently if you are over 21 years old. ?Skin check. ?Lung cancer screening. You may have this screening every year starting at age 83 if you have a 30-pack-year history of smoking and currently smoke or have quit within the past 15 years. ?Fecal occult blood test (FOBT) of the stool. You may have this test every year starting at age 79. ?Flexible sigmoidoscopy or colonoscopy. You may have a sigmoidoscopy every 5 years or a colonoscopy every 10 years starting at age 58. ?Prostate cancer screening. Recommendations will vary depending on your family history and other risks. ?Hepatitis C blood test. ?Hepatitis B blood test. ?Sexually transmitted disease (STD) testing. ?Diabetes screening. This is done by checking your blood sugar (glucose) after you have not eaten for a while (fasting). You may have this done every 1-3 years. ?Abdominal aortic aneurysm (AAA) screening. You may need this if you are a current or former smoker. ?Osteoporosis. You may be screened starting at age 76 if you are at high risk. ?Talk with your health care provider about your test results, treatment options, and if necessary, the need for more tests. ?Vaccines  ?Your health care provider may recommend certain vaccines, such as: ?Influenza vaccine. This is recommended every year. ?Tetanus, diphtheria, and acellular pertussis (Tdap, Td) vaccine. You may need a Td booster every 10 years. ?Zoster vaccine. You may need this after age 2. ?Pneumococcal 13-valent conjugate (PCV13) vaccine. One dose is recommended after age 28. ?Pneumococcal polysaccharide (PPSV23)  vaccine. One dose is recommended after age 73. ?Talk to your  health care provider about which screenings and vaccines you need and how often you need them. ?This information is not intended to replace advice given to you by your health care provider. Make sure you discuss any questions you have with your health care provider. ?Document Released: 03/04/2015 Document Revised: 10/26/2015 Document Reviewed: 12/07/2014 ?Elsevier Interactive Patient Education ? 2017 [place]. ? ?Fall Prevention in the Home ?Falls can cause injuries. They can happen to people of all ages. There are many things you can do to make your home safe and to help prevent falls. ?What can I do on the outside of my home? ?Regularly fix the edges of walkways and driveways and fix any cracks. ?Remove anything that might make you trip as you walk through a door, such as a raised step or threshold. ?Trim any bushes or trees on the path to your home. ?Use bright outdoor lighting. ?Clear any walking paths of anything that might make someone trip, such as rocks or tools. ?Regularly check to see if handrails are loose or broken. Make sure that both sides of any steps have handrails. ?Any raised decks and porches should have guardrails on the edges. ?Have any leaves, snow, or ice cleared regularly. ?Use sand or salt on walking paths during winter. ?Clean up any spills in your garage right away. This includes oil or grease spills. ?What can I do in the bathroom? ?Use night lights. ?Install grab bars by the toilet and in the tub and shower. Do not use towel bars as grab bars. ?Use non-skid mats or decals in the tub or shower. ?If you need to sit down in the shower, use a plastic, non-slip stool. ?Keep the floor dry. Clean up any water that spills on the floor as soon as it happens. ?Remove soap buildup in the tub or shower regularly. ?Attach bath mats securely with double-sided non-slip rug tape. ?Do not have throw rugs and other things on the floor that can make you trip. ?What can I do in the bedroom? ?Use night  lights. ?Make sure that you have a light by your bed that is easy to reach. ?Do not use any sheets or blankets that are too big for your bed. They should not hang down onto the floor. ?Have a firm chair that has side arms. You can use this for support while you get dressed. ?Do not have throw rugs and other things on the floor that can make you trip. ?What can I do in the kitchen? ?Clean up any spills right away. ?Avoid walking on wet floors. ?Keep items that you use a lot in easy-to-reach places. ?If you need to reach something above you, use a strong step stool that has a grab bar. ?Keep electrical cords out of the way. ?Do not use floor polish or wax that makes floors slippery. If you must use wax, use non-skid floor wax. ?Do not have throw rugs and other things on the floor that can make you trip. ?What can I do with my stairs? ?Do not leave any items on the stairs. ?Make sure that there are handrails on both sides of the stairs and use them. Fix handrails that are broken or loose. Make sure that handrails are as long as the stairways. ?Check any carpeting to make sure that it is firmly attached to the stairs. Fix any carpet that is loose or worn. ?Avoid having throw rugs at the top or bottom  of the stairs. If you do have throw rugs, attach them to the floor with carpet tape. ?Make sure that you have a light switch at the top of the stairs and the bottom of the stairs. If you do not have them, ask someone to add them for you. ?What else can I do to help prevent falls? ?Wear shoes that: ?Do not have high heels. ?Have rubber bottoms. ?Are comfortable and fit you well. ?Are closed at the toe. Do not wear sandals. ?If you use a stepladder: ?Make sure that it is fully opened. Do not climb a closed stepladder. ?Make sure that both sides of the stepladder are locked into place. ?Ask someone to hold it for you, if possible. ?Clearly mark and make sure that you can see: ?Any grab bars or handrails. ?First and last  steps. ?Where the edge of each step is. ?Use tools that help you move around (mobility aids) if they are needed. These include: ?Canes. ?Walkers. ?Scooters. ?Crutches. ?Turn on the lights when you go into a dar

## 2021-06-26 ENCOUNTER — Telehealth: Payer: Self-pay

## 2021-06-26 NOTE — Telephone Encounter (Signed)
Copied from [place]. Topic: General - Other ?>> Jun 26, 2021 12:47 PM Yvette Rack wrote: ?Reason for CRM: Lokesh with [employer] stated he will be sending in a fax and asked that his call be returned to [phone] Option# 2 ?

## 2021-06-27 NOTE — Telephone Encounter (Signed)
Have you seen a fax on this patient? ?

## 2021-06-28 NOTE — Telephone Encounter (Signed)
Forms received, filled out and faxed back. ?

## 2021-06-28 NOTE — Telephone Encounter (Signed)
I returned their call. I was advised that Dexilant requires a PA. A PA has been started, but there are additional question that need to be answered. Per patient insurance, a prior auth form with the additional questions has been faxed to our office under Dr. Marlan Palau name. I checked with Dr. Marlan Palau team and was informed that they did not have this form. I requested that patients insurance re-fax form to office. Awaiting form. ?

## 2021-06-29 ENCOUNTER — Encounter: Payer: Self-pay | Admitting: Family Medicine

## 2021-07-02 ENCOUNTER — Encounter: Payer: Self-pay | Admitting: Family Medicine

## 2021-07-03 ENCOUNTER — Other Ambulatory Visit: Payer: Self-pay

## 2021-07-03 DIAGNOSIS — R12 Heartburn: Secondary | ICD-10-CM

## 2021-07-03 MED ORDER — DEXLANSOPRAZOLE 60 MG PO CPDR: 1.0000 | DELAYED_RELEASE_CAPSULE | Freq: Every day | ORAL | 1 refills | Status: DC

## 2021-12-05 ENCOUNTER — Other Ambulatory Visit: Payer: PPO

## 2021-12-05 DIAGNOSIS — N400 Enlarged prostate without lower urinary tract symptoms: Secondary | ICD-10-CM

## 2021-12-06 LAB — PSA: Prostate Specific Ag, Serum: 1.1 ng/mL (ref 0.0–4.0)

## 2021-12-07 ENCOUNTER — Ambulatory Visit: Payer: PPO | Admitting: Urology

## 2021-12-07 VITALS — BP 120/73 | HR 67 | Ht 68.0 in | Wt 189.4 lb

## 2021-12-07 DIAGNOSIS — R351 Nocturia: Secondary | ICD-10-CM | POA: Diagnosis not present

## 2021-12-07 DIAGNOSIS — N401 Enlarged prostate with lower urinary tract symptoms: Secondary | ICD-10-CM

## 2021-12-07 DIAGNOSIS — Z125 Encounter for screening for malignant neoplasm of prostate: Secondary | ICD-10-CM | POA: Diagnosis not present

## 2021-12-07 DIAGNOSIS — N400 Enlarged prostate without lower urinary tract symptoms: Secondary | ICD-10-CM

## 2021-12-07 NOTE — Progress Notes (Signed)
   12/07/2021 2:52 PM   Benjamin Gutierrez [birth year] [id number]  Reason for visit: Follow up BPH, history recurrent UTI, PSA screening   HPI: He is a relatively healthy 71 year old male with a family history of prostate cancer in his brother, who we have been following for the above issues.  In terms of his PSA screening, PSA has been stable at ~1 for the last few years, and was most recently 1.1 in October 2023.  We previously reviewed the AUA guidelines regarding recommendations against routine screening in men over age 44, however with his family history and otherwise good health he would like to continue PSA screening every 1 to 2 years at least for another few years.    Regarding his urinary symptoms, he denies any problems during the day, but has nocturia 3-4 times overnight.  He previously was trialed on Flomax with severe lightheadedness/dizziness and stopped that medication.  He denies any infections or epididymitis in the last year.  PVR last year was normal.  We reviewed behavioral strategies regarding nocturia including minimizing fluids in the evening and double voiding prior to bedtime, I also recommended considering sleep apnea evaluation.    RTC 1 year with PSA prior, PVR  [name], MD  [facility] [address] [place], [place] [address] [phone]

## 2021-12-07 NOTE — Patient Instructions (Signed)
Nocturia refers to the need to wake up during the night to urinate, which can disrupt your sleep and impact your overall well-being. Fortunately, there are several strategies you can employ to help prevent or manage nocturia. It's important to consult with your healthcare provider before making any significant changes to your routine. Here are some helpful strategies to consider:  Limit Fluid Intake Before Bed: Avoid drinking large amounts of fluids in the evening, especially within a few hours of bedtime. Consume most of your daily fluid intake earlier in the day to reduce the need to urinate at night.  Monitor Your Diet: Limit your intake of caffeine and alcohol, as these substances can increase urine production and irritate the bladder.  Avoid diet, "zero calorie," and artificially sweetened drinks, especially sodas, in the afternoon or evening. Be mindful of consuming foods and drinks with high water content before bedtime, such as watermelon and herbal teas.  Time Your Medications: If you're taking medications that contribute to increased urination, consult your healthcare provider about adjusting the timing of these medications to minimize their impact during the night.  Practice Double Voiding: Before going to bed, make an effort to empty your bladder twice within a short period. This can help reduce the amount of urine left in your bladder before sleep.  Bladder Training: Gradually increase the time between bathroom visits during the day to train your bladder to hold larger volumes of urine. Over time, this can help reduce the frequency of nighttime awakenings to urinate.  Elevate Your Legs During the Day: Elevating your legs during the day can help minimize fluid retention in your lower extremities, which might reduce nighttime urination.  Pelvic Floor Exercises: Strengthening your pelvic floor muscles through Kegel exercises can help improve bladder control and potentially reduce  the urge to urinate at night.  Create a Relaxing Bedtime Routine: Stress and anxiety can exacerbate nocturia. Engage in calming activities before bed, such as reading, listening to soothing music, or practicing relaxation techniques.  Stay Active: Engage in regular physical activity, but avoid intense exercise close to bedtime, as this can increase your body's demand for fluids.  Maintain a Healthy Weight: Excess weight can compress the bladder and contribute to bladder and urinary issues. Aim to achieve and maintain a healthy weight through a balanced diet and regular exercise.  Remember that every individual is unique, and the effectiveness of these strategies may vary. It's important to work with your healthcare provider to develop a plan that suits your specific needs and addresses any underlying causes of nocturia.   Sleep Apnea Sleep apnea is a condition in which breathing pauses or becomes shallow during sleep. People with sleep apnea usually snore loudly. They may have times when they gasp and stop breathing for 10 seconds or more during sleep. This may happen many times during the night. Sleep apnea disrupts your sleep and keeps your body from getting the rest that it needs. This condition can increase your risk of certain health problems, including: Heart attack. Stroke. Obesity. Type 2 diabetes. Heart failure. Irregular heartbeat. High blood pressure. The goal of treatment is to help you breathe normally again. What are the causes?  The most common cause of sleep apnea is a collapsed or blocked airway. There are three kinds of sleep apnea: Obstructive sleep apnea. This kind is caused by a blocked or collapsed airway. Central sleep apnea. This kind happens when the part of the brain that controls breathing does not send the correct signals to  the muscles that control breathing. Mixed sleep apnea. This is a combination of obstructive and central sleep apnea. What increases  the risk? You are more likely to develop this condition if you: Are overweight. Smoke. Have a smaller than normal airway. Are older. Are male. Drink alcohol. Take sedatives or tranquilizers. Have a family history of sleep apnea. Have a tongue or tonsils that are larger than normal. What are the signs or symptoms? Symptoms of this condition include: Trouble staying asleep. Loud snoring. Morning headaches. Waking up gasping. Dry mouth or sore throat in the morning. Daytime sleepiness and tiredness. If you have daytime fatigue because of sleep apnea, you may be more likely to have: Trouble concentrating. Forgetfulness. Irritability or mood swings. Personality changes. Feelings of depression. Sexual dysfunction. This may include loss of interest if you are male, or erectile dysfunction if you are male. How is this diagnosed? This condition may be diagnosed with: A medical history. A physical exam. A series of tests that are done while you are sleeping (sleep study). These tests are usually done in a sleep lab, but they may also be done at home. How is this treated? Treatment for this condition aims to restore normal breathing and to ease symptoms during sleep. It may involve managing health issues that can affect breathing, such as high blood pressure or obesity. Treatment may include: Sleeping on your side. Using a decongestant if you have nasal congestion. Avoiding the use of depressants, including alcohol, sedatives, and narcotics. Losing weight if you are overweight. Making changes to your diet. Quitting smoking. Using a device to open your airway while you sleep, such as: An oral appliance. This is a custom-made mouthpiece that shifts your lower jaw forward. A continuous positive airway pressure (CPAP) device. This device blows air through a mask when you breathe out (exhale). A nasal expiratory positive airway pressure (EPAP) device. This device has valves that you put  into each nostril. A bi-level positive airway pressure (BIPAP) device. This device blows air through a mask when you breathe in (inhale) and breathe out (exhale). Having surgery if other treatments do not work. During surgery, excess tissue is removed to create a wider airway. Follow these instructions at home: Lifestyle Make any lifestyle changes that your health care provider recommends. Eat a healthy, well-balanced diet. Take steps to lose weight if you are overweight. Avoid using depressants, including alcohol, sedatives, and narcotics. Do not use any products that contain nicotine or tobacco. These products include cigarettes, chewing tobacco, and vaping devices, such as e-cigarettes. If you need help quitting, ask your health care provider. General instructions Take over-the-counter and prescription medicines only as told by your health care provider. If you were given a device to open your airway while you sleep, use it only as told by your health care provider. If you are having surgery, make sure to tell your health care provider you have sleep apnea. You may need to bring your device with you. Keep all follow-up visits. This is important. Contact a health care provider if: The device that you received to open your airway during sleep is uncomfortable or does not seem to be working. Your symptoms do not improve. Your symptoms get worse. Get help right away if: You develop: Chest pain. Shortness of breath. Discomfort in your back, arms, or stomach. You have: Trouble speaking. Weakness on one side of your body. Drooping in your face. These symptoms may represent a serious problem that is an emergency. Do not wait to  see if the symptoms will go away. Get medical help right away. Call your local emergency services (911 in the [place] Do not drive yourself to the hospital. Summary Sleep apnea is a condition in which breathing pauses or becomes shallow during sleep. The most common  cause is a collapsed or blocked airway. The goal of treatment is to restore normal breathing and to ease symptoms during sleep. This information is not intended to replace advice given to you by your health care provider. Make sure you discuss any questions you have with your health care provider. Document Revised: 09/14/2020 Document Reviewed: 01/15/2020 Elsevier Patient Education  [place].

## 2021-12-19 DIAGNOSIS — H04123 Dry eye syndrome of bilateral lacrimal glands: Secondary | ICD-10-CM | POA: Diagnosis not present

## 2021-12-22 ENCOUNTER — Encounter: Payer: Self-pay | Admitting: Pulmonary Disease

## 2021-12-22 ENCOUNTER — Ambulatory Visit (INDEPENDENT_AMBULATORY_CARE_PROVIDER_SITE_OTHER): Payer: PPO | Admitting: Pulmonary Disease

## 2021-12-22 VITALS — BP 112/74 | HR 73 | Temp 97.7°F | Ht 68.0 in | Wt 190.0 lb

## 2021-12-22 DIAGNOSIS — R0683 Snoring: Secondary | ICD-10-CM | POA: Diagnosis not present

## 2021-12-22 NOTE — Addendum Note (Signed)
Addended by: Dessie Coma on: 12/22/2021 02:43 PM   Modules accepted: Orders

## 2021-12-22 NOTE — Progress Notes (Signed)
Benjamin Gutierrez    [id number]    11/13/1950  Primary Care Physician:Payne, Jaci Standard, FNP  Referring Physician: Gwyneth Sprout, [address] [place],  [place] [address]  Chief complaint:   Nonrestrictive sleep, multiple awakenings  HPI:  Patient being seen for concern about his sleeping  Wakes up a few times to go to the bathroom, wakes up about every 3 hours  Usually goes to bed between 10 and 11 Falls asleep in about 30 minutes 2-3 awakenings Final wake up time between 9 and 10 AM  Admits to snoring, dryness of his mouth in the morning, admits to headaches in the mornings Occasional night sweats but this are rare Memory is good  Remembers that his parents snored  He does have some sleepiness during the day States that his wife tells him that anytime he sits not doing anything will fall asleep  Does not drink too late into the evening  Never smoker   Outpatient Encounter Medications as of 12/22/2021  Medication Sig   acetaminophen (TYLENOL) 500 MG tablet Take 500 mg by mouth every 6 (six) hours as needed.   dexlansoprazole (DEXILANT) 60 MG capsule Take 1 capsule (60 mg total) by mouth daily.   fluticasone (FLONASE) 50 MCG/ACT nasal spray Place 2 sprays into both nostrils daily.   hydrochlorothiazide (HYDRODIURIL) 25 MG tablet Take 1 tablet (25 mg total) by mouth daily.   ibuprofen (ADVIL,MOTRIN) 200 MG tablet Take 200 mg by mouth every 6 (six) hours as needed.   metoprolol tartrate (LOPRESSOR) 25 MG tablet Take 1 tablet (25 mg total) by mouth 2 (two) times daily.   naproxen (EC NAPROSYN) 500 MG EC tablet Take 500 mg by mouth as needed.    No facility-administered encounter medications on file as of 12/22/2021.    Allergies as of 12/22/2021 - Review Complete 12/07/2021  Allergen Reaction Noted   Betadine [povidone iodine] Itching 08/06/2014   Fentanyl Other (See Comments) 11/15/2016   Penicillins Rash 08/06/2014    Past Medical History:   Diagnosis Date   Acid reflux 08/06/2014   Anxiety 08/06/2014   Arthritis    Blood in feces 08/06/2014   Brachial neuritis 01/02/2008   Cancer ([employer])    forehead   Decreased libido 08/06/2014   Dizziness and giddiness 08/06/2014   ED (erectile dysfunction) of organic origin 08/06/2014   GERD (gastroesophageal reflux disease)    H/O arthrodesis 08/06/2014   Leg varices 08/06/2014    Past Surgical History:  Procedure Laterality Date   [place]  2009   radiculopathy from past fusion and arthritic changes   COLONOSCOPY  2013   COLONOSCOPY WITH PROPOFOL N/A 06/07/2020   Procedure: COLONOSCOPY WITH PROPOFOL;  Surgeon: Lucilla Lame, MD;  [facility] END[facility];  Service: Endoscopy;  Laterality: N/A;   ESOPHAGOGASTRODUODENOSCOPY ENDOSCOPY     EYE SURGERY  01/02/2018   right eye lid mole removed   LAMINECTOMY  2005   PAROTIDECTOMY Left 12/13/2016   Procedure: TOTAL PAROTIDECTOMY WITH FACIAL NERVE MONITORING;  Surgeon: Carloyn Manner, MD;  Location: [facility] ORS;  Service: ENT;  Laterality: Left;    Family History  Problem Relation Age of Onset   Esophageal cancer Mother    Alcohol abuse Father    Prostate cancer Brother    Parkinson's disease Maternal Uncle    Dementia Paternal Grandmother     Social History   Socioeconomic History   Marital status:  Married    Spouse name: Bethena Roys   Number of children: 2   Years of education: 12   Highest education level: High school graduate  Occupational History   Occupation: Retired  Tobacco Use   Smoking status: Never   Smokeless tobacco: Never  Vaping Use   Vaping Use: Never used  Substance and Sexual Activity   Alcohol use: Not Currently    Alcohol/week: 0.0 standard drinks of alcohol   Drug use: No   Sexual activity: Yes    Birth control/protection: None  Other Topics Concern   Not on file  Social History Narrative   Not on file   Social Determinants of Health   Financial Resource  Strain: Low Risk  (05/17/2020)   Overall Financial Resource Strain (CARDIA)    Difficulty of Paying Living Expenses: Not hard at all  Food Insecurity: No Food Insecurity (05/23/2021)   Hunger Vital Sign    Worried About Running Out of Food in the Last Year: Never true    [place] in the Last Year: Never true  Transportation Needs: No Transportation Needs (05/23/2021)   PRAPARE - Hydrologist (Medical): No    Lack of Transportation (Non-Medical): No  Physical Activity: Insufficiently Active (05/23/2021)   Exercise Vital Sign    Days of Exercise per Week: 3 days    Minutes of Exercise per Session: 30 min  Stress: No Stress Concern Present (05/23/2021)   [place]    Feeling of Stress : Not at all  Social Connections: Moderately Integrated (05/23/2021)   Social Connection and Isolation Panel [NHANES]    Frequency of Communication with Friends and Family: More than three times a week    Frequency of Social Gatherings with Friends and Family: Once a week    Attends Religious Services: More than 4 times per year    Active Member of [employer] or Organizations: No    Attends Archivist Meetings: Never    Marital Status: Married  Human resources officer Violence: Not At Risk (05/23/2021)   Humiliation, Afraid, Rape, and Kick questionnaire    Fear of Current or Ex-Partner: No    Emotionally Abused: No    Physically Abused: No    Sexually Abused: No    Review of Systems  Psychiatric/Behavioral:  Positive for sleep disturbance.     There were no vitals filed for this visit.   Physical Exam Constitutional:      Appearance: Normal appearance.  HENT:     Head: Normocephalic.     Mouth/Throat:     Mouth: Mucous membranes are moist.     Comments: Mallampati 3, crowded oropharynx Cardiovascular:     Rate and Rhythm: Normal rate and regular rhythm.     Heart sounds: No murmur heard.    No friction  rub.  Pulmonary:     Effort: No respiratory distress.     Breath sounds: No stridor. No wheezing or rhonchi.  Musculoskeletal:     Cervical back: No rigidity or tenderness.  Neurological:     Mental Status: He is alert.  Psychiatric:        Mood and Affect: Mood normal.       12/22/2021    2:00 PM  Results of the Epworth flowsheet  Sitting and reading 1  Watching TV 1  Sitting, inactive in a public place (e.g. a theatre or a meeting) 0  As a passenger in a  car for an hour without a break 1  Lying down to rest in the afternoon when circumstances permit 1  Sitting and talking to someone 0  Sitting quietly after a lunch without alcohol 0  In a car, while stopped for a few minutes in traffic 0  Total score 4     Data Reviewed: No previous sleep study available  Assessment:  Moderate poverty of significant obstructive sleep apnea  Snoring  Nonrestrictive sleep  Pathophysiology of sleep disordered breathing discussed with the patient Treatment options for sleep disordered breathing discussed with the patient  Plan/Recommendations: We will schedule the patient for home sleep study  Graded exercise as tolerated  Encouraged to give [place] a call with any significant concerns  Tentative follow-up in 3 to 4 months   Sherrilyn Rist MD  Pulmonary and Critical Care 12/22/2021, 2:09 PM  CC: Gwyneth Sprout, FNP

## 2021-12-22 NOTE — Patient Instructions (Signed)
We will schedule you for a home sleep study  Graded exercise as tolerated  We will update you with results as soon as reviewed  Living With Sleep Apnea Sleep apnea is a condition in which breathing pauses or becomes shallow during sleep. Sleep apnea is most commonly caused by a collapsed or blocked airway. People with sleep apnea usually snore loudly. They may have times when they gasp and stop breathing for 10 seconds or more during sleep. This may happen many times during the night. The breaks in breathing also interrupt the deep sleep that you need to feel rested. Even if you do not completely wake up from the gaps in breathing, your sleep may not be restful and you feel tired during the day. You may also have a headache in the morning and low energy during the day, and you may feel anxious or depressed. How can sleep apnea affect me? Sleep apnea increases your chances of extreme tiredness during the day (daytime fatigue). It can also increase your risk for health conditions, such as: Heart attack. Stroke. Obesity. Type 2 diabetes. Heart failure. Irregular heartbeat. High blood pressure. If you have daytime fatigue as a result of sleep apnea, you may be more likely to: Perform poorly at school or work. Fall asleep while driving. Have difficulty with attention. Develop depression or anxiety. Have sexual dysfunction. What actions can I take to manage sleep apnea? Sleep apnea treatment  If you were given a device to open your airway while you sleep, use it only as told by your health care provider. You may be given: An oral appliance. This is a custom-made mouthpiece that shifts your lower jaw forward. A continuous positive airway pressure (CPAP) device. This device blows air through a mask when you breathe out (exhale). A nasal expiratory positive airway pressure (EPAP) device. This device has valves that you put into each nostril. A bi-level positive airway pressure (BIPAP) device.  This device blows air through a mask when you breathe in (inhale) and breathe out (exhale). You may need surgery if other treatments do not work for you. Sleep habits Go to sleep and wake up at the same time every day. This helps set your internal clock (circadian rhythm) for sleeping. If you stay up later than usual, such as on weekends, try to get up in the morning within 2 hours of your normal wake time. Try to get at least 7-9 hours of sleep each night. Stop using a computer, tablet, and mobile phone a few hours before bedtime. Do not take long naps during the day. If you nap, limit it to 30 minutes. Have a relaxing bedtime routine. Reading or listening to music may relax you and help you sleep. Use your bedroom only for sleep. Keep your television and computer out of your bedroom. Keep your bedroom cool, dark, and quiet. Use a supportive mattress and pillows. Follow your health care provider's instructions for other changes to sleep habits. Nutrition Do not eat heavy meals in the evening. Do not have caffeine in the later part of the day. The effects of caffeine can last for more than 5 hours. Follow your health care provider's or dietitian's instructions for any diet changes. Lifestyle     Do not drink alcohol before bedtime. Alcohol can cause you to fall asleep at first, but then it can cause you to wake up in the middle of the night and have trouble getting back to sleep. Do not use any products that contain nicotine  or tobacco. These products include cigarettes, chewing tobacco, and vaping devices, such as e-cigarettes. If you need help quitting, ask your health care provider. Medicines Take over-the-counter and prescription medicines only as told by your health care provider. Do not use over-the-counter sleep medicine. You can become dependent on this medicine, and it can make sleep apnea worse. Do not use medicines, such as sedatives and narcotics, unless told by your health  care provider. Activity Exercise on most days, but avoid exercising in the evening. Exercising near bedtime can interfere with sleeping. If possible, spend time outside every day. Natural light helps regulate your circadian rhythm. General information Lose weight if you need to, and maintain a healthy weight. Keep all follow-up visits. This is important. If you are having surgery, make sure to tell your health care provider that you have sleep apnea. You may need to bring your device with you. Where to find more information Learn more about sleep apnea and daytime fatigue from: American Sleep Association: sleepassociation.[place]: sleepfoundation.org National Heart, Lung, and Blood Institute: https://www.[place].biz/ Summary Sleep apnea is a condition in which breathing pauses or becomes shallow during sleep. Sleep apnea can cause daytime fatigue and other serious health conditions. You may need to wear a device while sleeping to help keep your airway open. If you are having surgery, make sure to tell your health care provider that you have sleep apnea. You may need to bring your device with you. Making changes to sleep habits, diet, lifestyle, and activity can help you manage sleep apnea. This information is not intended to replace advice given to you by your health care provider. Make sure you discuss any questions you have with your health care provider. Document Revised: 09/14/2020 Document Reviewed: 01/15/2020 Elsevier Patient Education  [place].

## 2021-12-29 ENCOUNTER — Other Ambulatory Visit: Payer: Self-pay | Admitting: Family Medicine

## 2021-12-29 DIAGNOSIS — R12 Heartburn: Secondary | ICD-10-CM

## 2022-01-01 ENCOUNTER — Telehealth (INDEPENDENT_AMBULATORY_CARE_PROVIDER_SITE_OTHER): Payer: PPO | Admitting: Family Medicine

## 2022-01-01 ENCOUNTER — Telehealth: Payer: PPO | Admitting: Family Medicine

## 2022-01-01 DIAGNOSIS — R12 Heartburn: Secondary | ICD-10-CM | POA: Diagnosis not present

## 2022-01-01 DIAGNOSIS — R000 Tachycardia, unspecified: Secondary | ICD-10-CM

## 2022-01-01 DIAGNOSIS — H938X3 Other specified disorders of ear, bilateral: Secondary | ICD-10-CM | POA: Diagnosis not present

## 2022-01-01 DIAGNOSIS — K219 Gastro-esophageal reflux disease without esophagitis: Secondary | ICD-10-CM | POA: Diagnosis not present

## 2022-01-01 DIAGNOSIS — I10 Essential (primary) hypertension: Secondary | ICD-10-CM

## 2022-01-01 DIAGNOSIS — R251 Tremor, unspecified: Secondary | ICD-10-CM

## 2022-01-01 MED ORDER — FLUTICASONE PROPIONATE 50 MCG/ACT NA SUSP: 2.0000 | Freq: Every day | NASAL | 6 refills | Status: DC

## 2022-01-01 MED ORDER — METOPROLOL TARTRATE 25 MG PO TABS: 25.0000 mg | ORAL_TABLET | Freq: Two times a day (BID) | ORAL | 3 refills | Status: DC

## 2022-01-01 MED ORDER — DEXLANSOPRAZOLE 60 MG PO CPDR: 1.0000 | DELAYED_RELEASE_CAPSULE | Freq: Every day | ORAL | 1 refills | Status: DC

## 2022-01-01 NOTE — Assessment & Plan Note (Signed)
Chronic. Controlled with current regimen, no changes. Refill sent.

## 2022-01-01 NOTE — Assessment & Plan Note (Signed)
Chronic. Per chart review, recent BP measurements without HCTZ controlled 100-120s SBP. Ok to continue holding HCTZ. Recommend in person appt in 1-2 months for BP check and annual physical.

## 2022-01-01 NOTE — Progress Notes (Signed)
Virtual Visit via Video Note  I connected with Benjamin Gutierrez on 01/01/22 at 11:00 AM EST by a video enabled telemedicine application and verified that I am speaking with the correct person using two identifiers.  Location: Patient: home Provider: [facility]   I discussed the limitations of evaluation and management by telemedicine and the availability of in person appointments. The patient expressed understanding and agreed to proceed.  History of Present Illness:  GERD - Meds: dexilant - Symptoms:  no other symptoms.  - denies dysphagia denies melena, hematochezia, hematemesis, and coffee ground emesis.  - Previous treatment: proton pump inhibitors.  Hypertension: - Medications: HCTZ, metoprolol - Compliance: not taking HCTZ. Did not realize he was supposed to be taking.  - Checking BP at home: no - Denies any SOB, CP, vision changes, LE edema, medication SEs, or symptoms of hypotension    Observations/Objective:  Well appearing, in NAD. Speaks in full sentences. Comfortable WOB on RA. No resp distress.    Assessment and Plan:  Problem List Items Addressed This Visit       Cardiovascular and Mediastinum   Primary hypertension - Primary    Chronic. Per chart review, recent BP measurements without HCTZ controlled 100-120s SBP. Ok to continue holding HCTZ. Recommend in person appt in 1-2 months for BP check and annual physical.       Relevant Medications   metoprolol tartrate (LOPRESSOR) 25 MG tablet     Digestive   GERD (gastroesophageal reflux disease)    Chronic. Controlled with current regimen, no changes. Refill sent.       Relevant Medications   dexlansoprazole (DEXILANT) 60 MG capsule     Other   Tachycardia   Relevant Medications   metoprolol tartrate (LOPRESSOR) 25 MG tablet   Tremor of both hands   Relevant Medications   metoprolol tartrate (LOPRESSOR) 25 MG tablet   Other Visit Diagnoses     Chronic heartburn       Relevant Medications    dexlansoprazole (DEXILANT) 60 MG capsule   Congestion of both ears       Relevant Medications   fluticasone (FLONASE) 50 MCG/ACT nasal spray          I discussed the assessment and treatment plan with the patient. The patient was provided an opportunity to ask questions and all were answered. The patient agreed with the plan and demonstrated an understanding of the instructions.   The patient was advised to call back or seek an in-person evaluation if the symptoms worsen or if the condition fails to improve as anticipated.  I provided 8 minutes of non-face-to-face time during this encounter.   Myles Gip, DO

## 2022-01-08 DIAGNOSIS — Z85828 Personal history of other malignant neoplasm of skin: Secondary | ICD-10-CM | POA: Diagnosis not present

## 2022-01-08 DIAGNOSIS — D044 Carcinoma in situ of skin of scalp and neck: Secondary | ICD-10-CM | POA: Diagnosis not present

## 2022-01-08 DIAGNOSIS — D2262 Melanocytic nevi of left upper limb, including shoulder: Secondary | ICD-10-CM | POA: Diagnosis not present

## 2022-01-08 DIAGNOSIS — D2272 Melanocytic nevi of left lower limb, including hip: Secondary | ICD-10-CM | POA: Diagnosis not present

## 2022-01-08 DIAGNOSIS — D2261 Melanocytic nevi of right upper limb, including shoulder: Secondary | ICD-10-CM | POA: Diagnosis not present

## 2022-01-08 DIAGNOSIS — L570 Actinic keratosis: Secondary | ICD-10-CM | POA: Diagnosis not present

## 2022-01-08 DIAGNOSIS — D485 Neoplasm of uncertain behavior of skin: Secondary | ICD-10-CM | POA: Diagnosis not present

## 2022-01-28 IMAGING — US US SCROTUM W/ DOPPLER COMPLETE
1 series · 14 of 25 positions shown · non-contrast
Comparison: 11/12/2014.

CLINICAL DATA: Persistent scrotal pain with radiation to right
groin.

EXAM:
SCROTAL ULTRASOUND
DOPPLER ULTRASOUND OF THE TESTICLES
TECHNIQUE: Complete ultrasound examination of the testicles, epididymis, and
other scrotal structures was performed. Color and spectral Doppler
ultrasound were also utilized to evaluate blood flow to the
testicles.

[Series 1: us scrotum w/ doppler complete · 0.07mm/px · 14 of 33 slices shown]
[im 1/33]
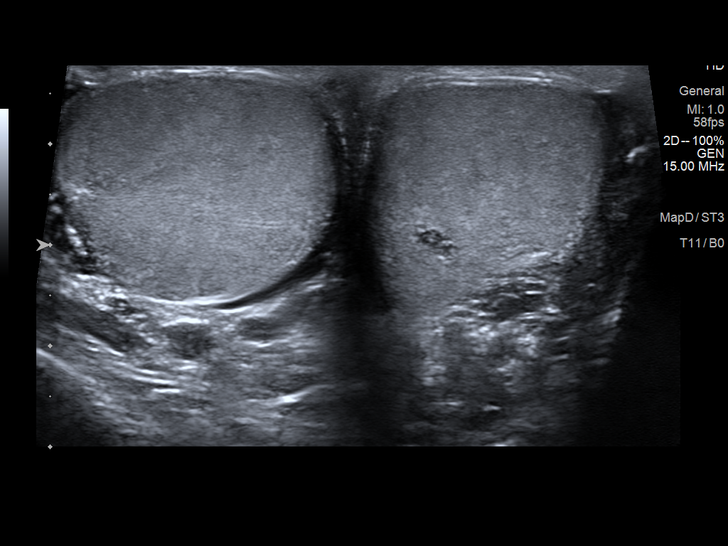
[im 3/33]
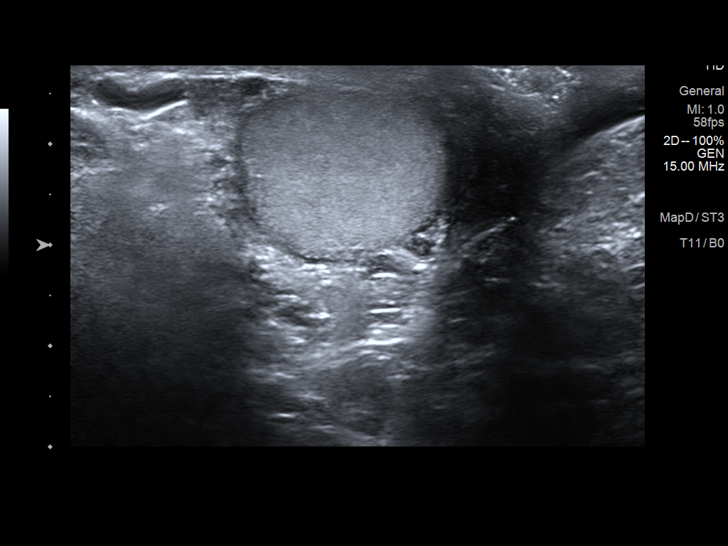
[im 6/33]
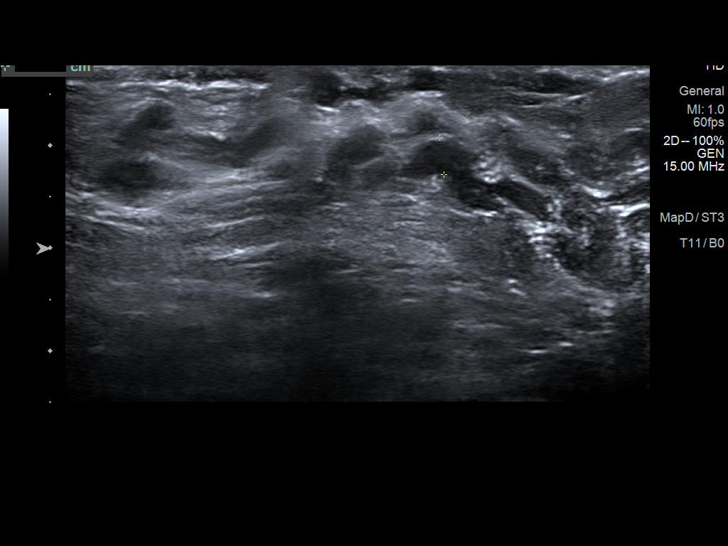
[im 9/33]
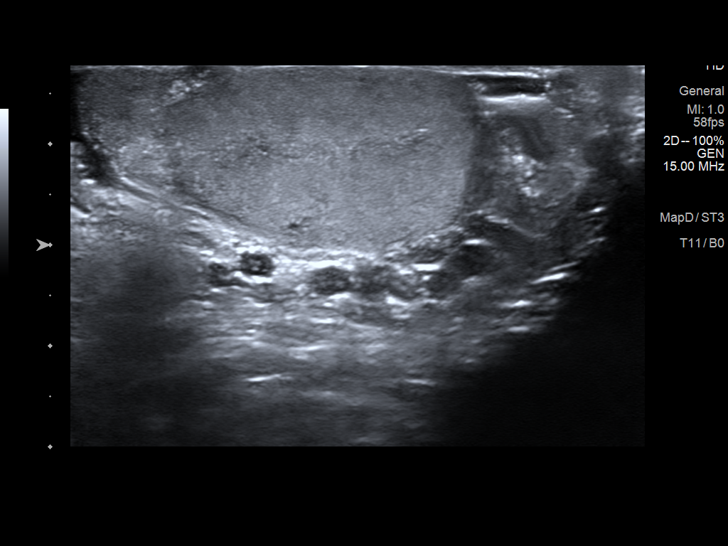
[im 11/33]
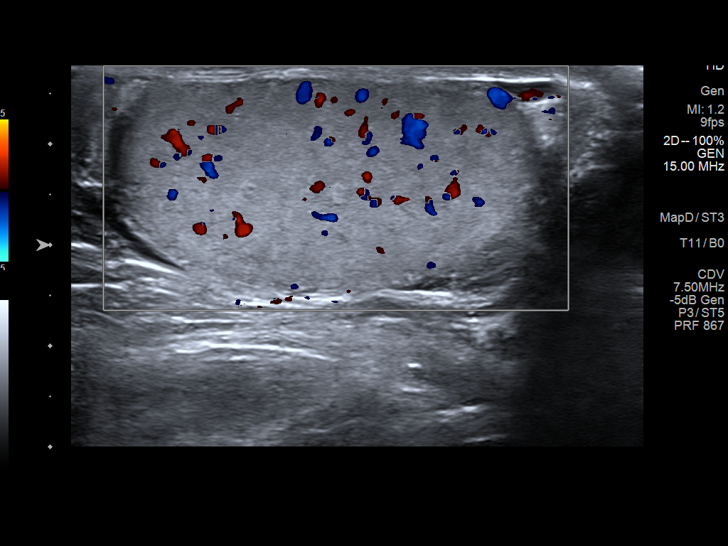
[im 13/33]
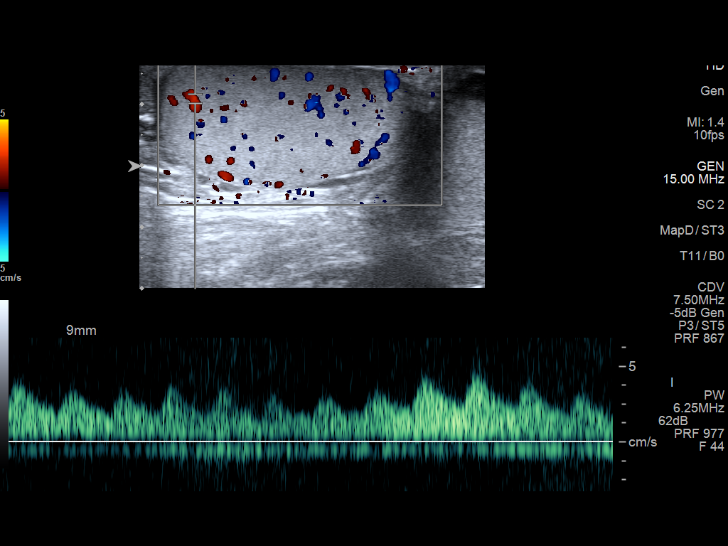
[im 15/33]
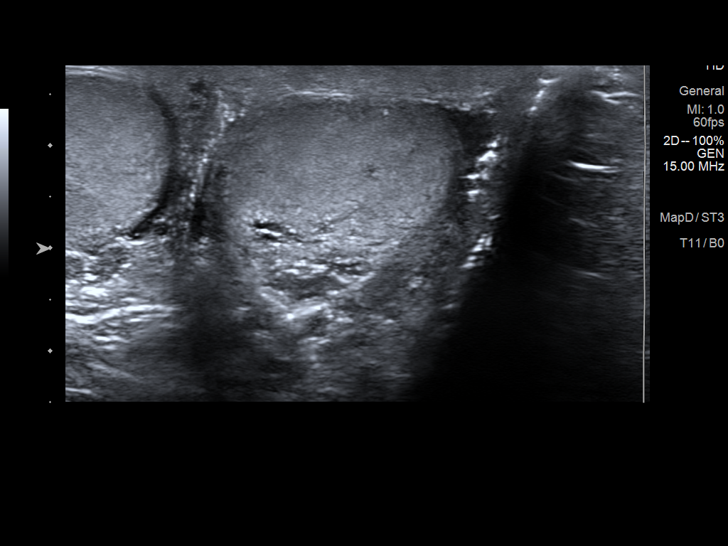
[im 18/33]
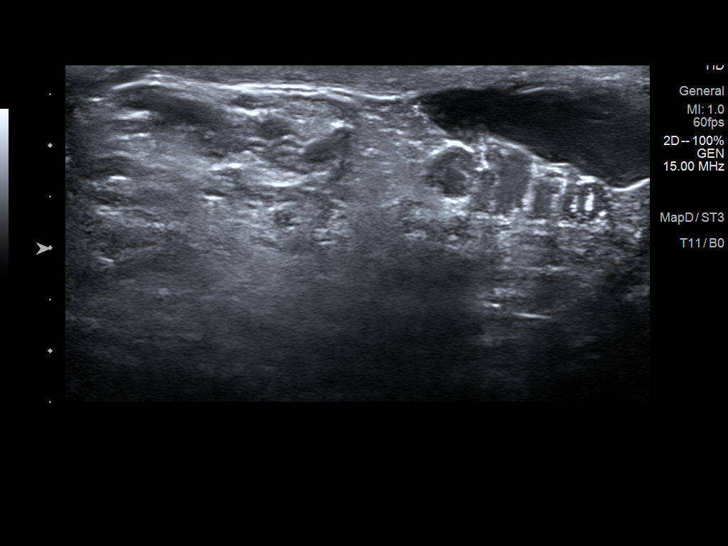
[im 21/33]
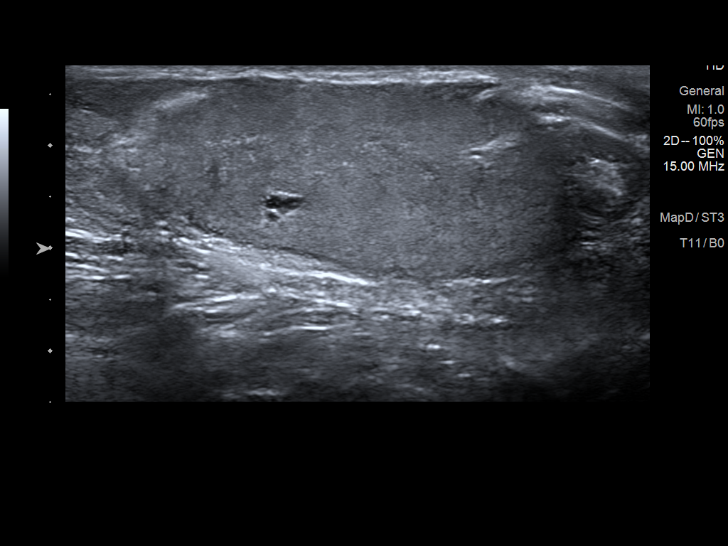
[im 22/33]
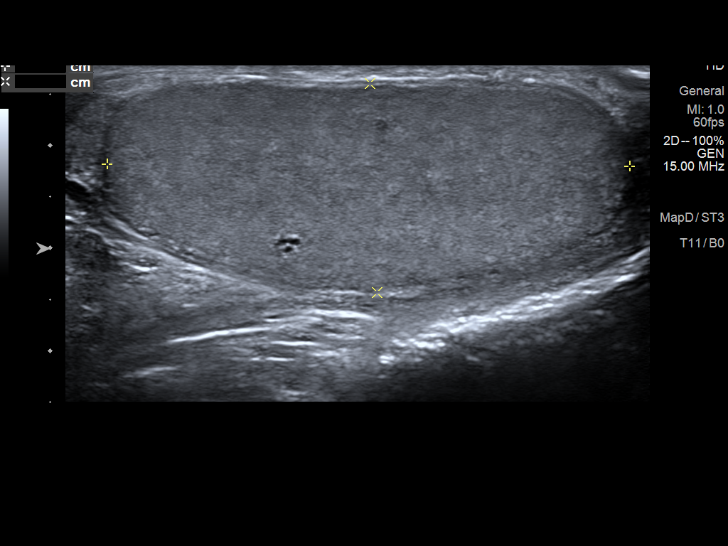
[im 25/33]
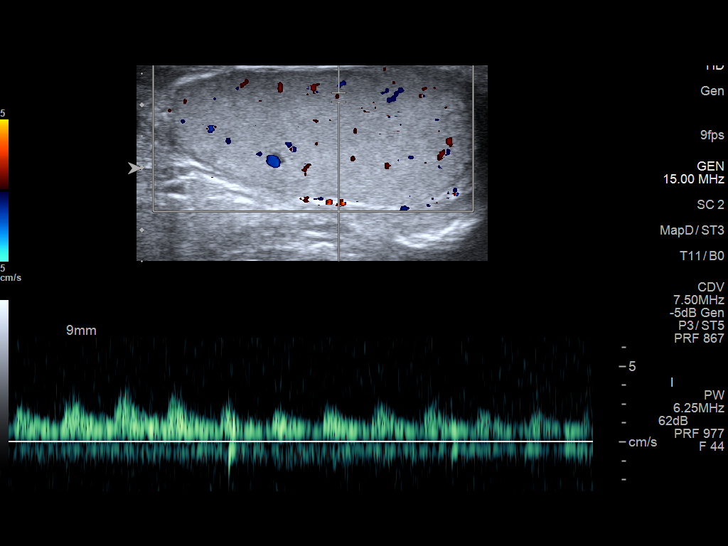
[im 27/33]
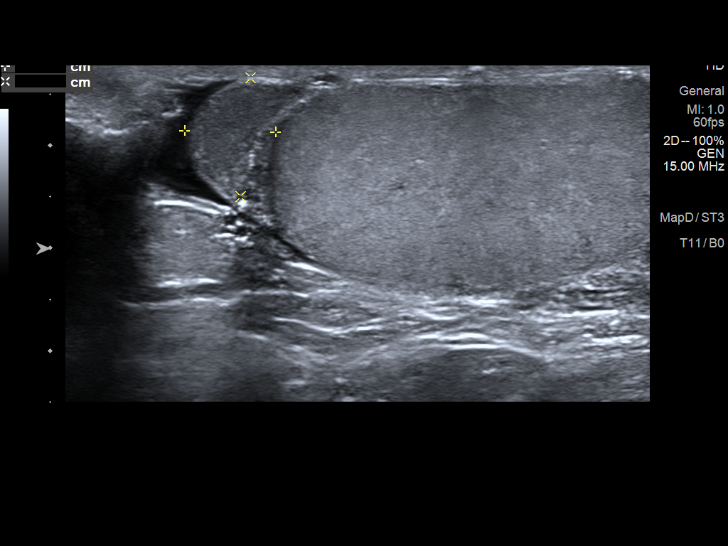
[im 30/33]
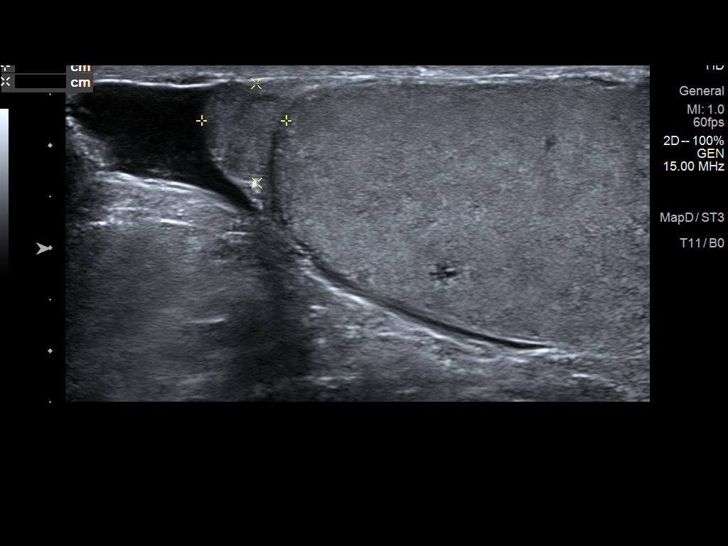
[im 33/33]
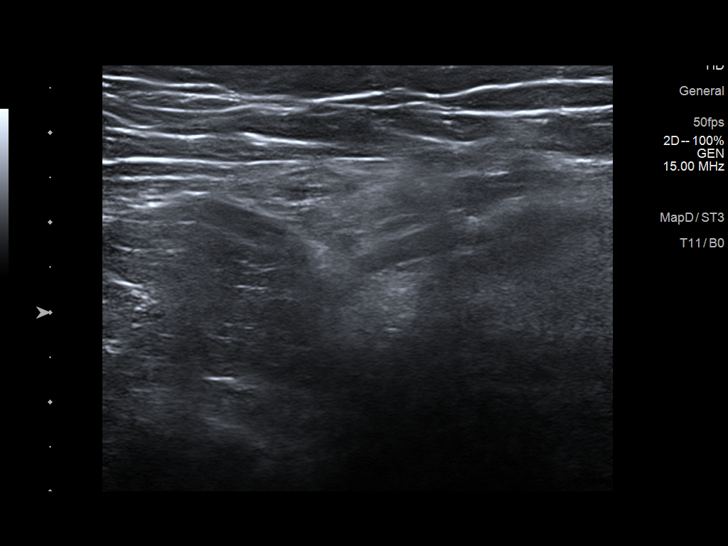

[14 of 25 positions shown; findings below may reference images not displayed]

FINDINGS: Right testicle

Measurements: 4.3 x 2.1 x 2.7 cm. No mass or microlithiasis
visualized.

Left testicle

Measurements: 5.1 x 2.0 x 2.7 cm. No mass or microlithiasis
visualized.

Right epididymis:  Normal in size and appearance.

Left epididymis:  Normal in size and appearance.

Hydrocele:  Bilateral small hydroceles.

Varicocele:  Bilateral small varicoceles.

Pulsed Doppler interrogation of both testes demonstrates normal low
resistance arterial and venous waveforms bilaterally.
IMPRESSION: 1.  Bilateral small hydroceles.  Bilateral small varicoceles.

2. Exam otherwise unremarkable. No evidence of testicular mass or
torsion.

## 2022-01-30 DIAGNOSIS — D044 Carcinoma in situ of skin of scalp and neck: Secondary | ICD-10-CM | POA: Diagnosis not present

## 2022-02-23 ENCOUNTER — Ambulatory Visit: Payer: PPO

## 2022-02-23 DIAGNOSIS — G4733 Obstructive sleep apnea (adult) (pediatric): Secondary | ICD-10-CM

## 2022-02-23 DIAGNOSIS — R0683 Snoring: Secondary | ICD-10-CM

## 2022-03-03 ENCOUNTER — Telehealth: Payer: Self-pay | Admitting: Pulmonary Disease

## 2022-03-03 DIAGNOSIS — G4733 Obstructive sleep apnea (adult) (pediatric): Secondary | ICD-10-CM | POA: Diagnosis not present

## 2022-03-03 NOTE — Telephone Encounter (Signed)
Call patient  Sleep study result  Date of study: 02/24/2022  Impression: Mild obstructive sleep apnea Moderate oxygen desaturations  Recommendation: Options of treatment for mild obstructive sleep apnea will include  1.  CPAP therapy if there is significant daytime sleepiness or other comorbidities including history of CVA or cardiac disease  -If CPAP is chosen as an option of treatment auto titrating CPAP with a pressure setting of 5-15 will be appropriate  2.  Watchful waiting with emphasis on weight loss measures, sleep position modification to optimize lateral sleep, elevating the head of the bed by about 30 degrees may also help.  3.  An oral device may be fashioned for the treatment of mild sleep disordered breathing, will involve referral to dentist.  Follow-up as previously scheduled

## 2022-03-05 NOTE — Telephone Encounter (Signed)
Spoke with the pt and notified of results/recs  He verbalized understanding  He wants to think about the different options and call us back with what treatment he decides on  Will hold

## 2022-03-08 NOTE — Telephone Encounter (Signed)
He can try the BIPAP on, will not recommend sleeping with it on.  Mild obstructive sleep apnea has its risks , whomever does not want treatment for it should work on significant weight loss which will at least help reduce risk. His oxygen also does drop quite a bit which makes not treating the sleep apnea risky.

## 2022-03-08 NOTE — Telephone Encounter (Signed)
Spoke with Patient regarding prior message.Patient was still unsure what options he wants to use.Patient's wife does have a BIPAP machine and was wondering if Patient can use the BIPAP machine for a few days to see if he likes it. Advised patient that I would send a message to Dr.Olalere and once I get a message back I will contact him back.  Dr.Olalere can you please advise Thank you

## 2022-03-08 NOTE — Telephone Encounter (Signed)
Spoke with the pt and notified of response per Dr Ander Slade He verbalized understanding  He states that he is going to try it on, and will call back to let us know how to proceed

## 2022-03-09 ENCOUNTER — Telehealth: Payer: Self-pay | Admitting: Pulmonary Disease

## 2022-03-12 ENCOUNTER — Other Ambulatory Visit: Payer: Self-pay

## 2022-03-12 DIAGNOSIS — R0683 Snoring: Secondary | ICD-10-CM

## 2022-03-12 NOTE — Telephone Encounter (Signed)
Spoke to patient he advised he wants to start on a CPAP machine. Order has been placed. Nothing further needed at  this time

## 2022-03-29 NOTE — Telephone Encounter (Signed)
Bethena Roys wife states patient's order for CPAP machine. [employer] did not get the order for CPAP machine. Bethena Roys phone number is [phone].

## 2022-03-29 NOTE — Telephone Encounter (Signed)
[place], [place]rie Don, Raven Anner [place], [place]; [place], Leory Plowman; [place], [place]; Nash Shearer Hello,  This order was received on 03-15-22 and sent to pap intake. I'm not sure why this was not entered. I am entering it now.  Thank you,  Demetrius Charity

## 2022-04-12 DIAGNOSIS — M5412 Radiculopathy, cervical region: Secondary | ICD-10-CM | POA: Diagnosis not present

## 2022-04-12 DIAGNOSIS — Z6827 Body mass index (BMI) 27.0-27.9, adult: Secondary | ICD-10-CM | POA: Diagnosis not present

## 2022-04-12 DIAGNOSIS — R29898 Other symptoms and signs involving the musculoskeletal system: Secondary | ICD-10-CM | POA: Diagnosis not present

## 2022-04-26 DIAGNOSIS — R202 Paresthesia of skin: Secondary | ICD-10-CM | POA: Diagnosis not present

## 2022-04-26 DIAGNOSIS — M5412 Radiculopathy, cervical region: Secondary | ICD-10-CM | POA: Diagnosis not present

## 2022-04-26 DIAGNOSIS — R200 Anesthesia of skin: Secondary | ICD-10-CM | POA: Diagnosis not present

## 2022-05-02 DIAGNOSIS — G4733 Obstructive sleep apnea (adult) (pediatric): Secondary | ICD-10-CM | POA: Diagnosis not present

## 2022-05-03 ENCOUNTER — Other Ambulatory Visit: Payer: Self-pay | Admitting: Pulmonary Disease

## 2022-05-03 ENCOUNTER — Telehealth: Payer: Self-pay | Admitting: Pulmonary Disease

## 2022-05-03 NOTE — Telephone Encounter (Signed)
Please ensure patient has follow-up following CPAP set up  Must occur between 06/02/2022 and 07/31/2022

## 2022-05-14 DIAGNOSIS — M5412 Radiculopathy, cervical region: Secondary | ICD-10-CM | POA: Diagnosis not present

## 2022-05-18 DIAGNOSIS — G4733 Obstructive sleep apnea (adult) (pediatric): Secondary | ICD-10-CM | POA: Diagnosis not present

## 2022-05-28 ENCOUNTER — Other Ambulatory Visit: Payer: PPO

## 2022-05-28 DIAGNOSIS — N400 Enlarged prostate without lower urinary tract symptoms: Secondary | ICD-10-CM

## 2022-05-29 ENCOUNTER — Ambulatory Visit (INDEPENDENT_AMBULATORY_CARE_PROVIDER_SITE_OTHER): Payer: PPO

## 2022-05-29 VITALS — BP 126/66 | Ht 68.0 in | Wt 187.0 lb

## 2022-05-29 DIAGNOSIS — H9193 Unspecified hearing loss, bilateral: Secondary | ICD-10-CM

## 2022-05-29 DIAGNOSIS — Z0000 Encounter for general adult medical examination without abnormal findings: Secondary | ICD-10-CM

## 2022-05-29 LAB — PSA: Prostate Specific Ag, Serum: 1 ng/mL (ref 0.0–4.0)

## 2022-05-29 NOTE — Addendum Note (Signed)
Addended by: Sue Lush on: 05/29/2022 04:16 PM   Modules accepted: Orders

## 2022-05-29 NOTE — Progress Notes (Signed)
Subjective:   Benjamin Gutierrez is a 72 y.o. male who presents for Medicare Annual/Subsequent preventive examination.  Review of Systems    Cardiac Risk Factors include: advanced age (>3455men, 31>65 women);hypertension;male gender    Objective:    Today's Vitals   05/29/22 1407  BP: 126/66  Weight: 187 lb (84.8 kg)  Height: 5\' 8"  (1.727 m)   Body mass index is 28.43 kg/m.     05/29/2022    2:22 PM 05/23/2021   11:02 AM 06/07/2020    7:54 AM 05/17/2020    1:44 PM 10/18/2017   10:41 AM 12/13/2016    1:31 PM 12/13/2016    1:25 PM  Advanced Directives  Does Patient Have a Medical Advance Directive? Yes No Yes Yes Yes No No  Type of Best boyAdvance Directive   Healthcare Power of [place];Living will Healthcare Power of [place];Living will Healthcare Power of [place];Living will    Copy of Healthcare Power of Attorney in Chart?   No - copy requested No - copy requested No - copy requested    Would patient like information on creating a medical advance directive?  No - Patient declined    No - Patient declined No - Patient declined    Current Medications (verified) Outpatient Encounter Medications as of 05/29/2022  Medication Sig   acetaminophen (TYLENOL) 500 MG tablet Take 500 mg by mouth every 6 (six) hours as needed.   dexlansoprazole (DEXILANT) 60 MG capsule Take 1 capsule (60 mg total) by mouth daily.   fluticasone (FLONASE) 50 MCG/ACT nasal spray Place 2 sprays into both nostrils daily.   metoprolol tartrate (LOPRESSOR) 25 MG tablet Take 1 tablet (25 mg total) by mouth 2 (two) times daily.   ibuprofen (ADVIL,MOTRIN) 200 MG tablet Take 200 mg by mouth every 6 (six) hours as needed. (Patient not taking: Reported on 05/29/2022)   naproxen (EC NAPROSYN) 500 MG EC tablet Take 500 mg by mouth as needed.  (Patient not taking: Reported on 05/29/2022)   No facility-administered encounter medications on file as of 05/29/2022.    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/06/2014   GERD (gastroesophageal reflux disease)    H/O arthrodesis 08/06/2014   Leg varices 08/06/2014   Past Surgical History:  Procedure Laterality Date   CARDIAC CATHETERIZATION  1993   CERVICAL SPINE SURGERY  2009   radiculopathy from past fusion and arthritic changes   COLONOSCOPY  2013   COLONOSCOPY WITH PROPOFOL N/A 06/07/2020   Procedure: COLONOSCOPY WITH PROPOFOL;  Surgeon: Midge MiniumWohl, Darren, MD;  [facility];  Service: Endoscopy;  Laterality: N/A;   ESOPHAGOGASTRODUODENOSCOPY ENDOSCOPY     EYE SURGERY  01/02/2018   right eye lid mole removed   LAMINECTOMY  2005   PAROTIDECTOMY Left 12/13/2016   Procedure: TOTAL PAROTIDECTOMY WITH FACIAL NERVE MONITORING;  Surgeon: Bud FaceVaught, Creighton, MD;  Location: [facility] ORS;  Service: ENT;  Laterality: Left;   SPINE SURGERY  2005     2009    2016   Neck surgery   Family History  Problem Relation Age of Onset   Esophageal cancer Mother    Arthritis  Mother    Cancer Mother    Alcohol abuse Father    Prostate cancer Brother    Parkinson's disease Maternal Uncle    Dementia Paternal Grandmother    Social History   Socioeconomic History   Marital status: Married    Spouse name: Darel Hong   Number of children: 2   Years of education: 12   Highest education level: High school graduate  Occupational History   Occupation: Retired  Tobacco Use   Smoking status: Never   Smokeless tobacco: Never   Tobacco comments:    I have never been a smoker.  Vaping Use   Vaping Use: Never used  Substance and Sexual Activity   Alcohol use: Never   Drug use: No   Sexual activity: Not Currently    Birth  control/protection: None  Other Topics Concern   Not on file  Social History Narrative   Not on file   Social Determinants of Health   Financial Resource Strain: Low Risk  (05/25/2022)   Overall Financial Resource Strain (CARDIA)    Difficulty of Paying Living Expenses: Not hard at all  Food Insecurity: No Food Insecurity (05/25/2022)   Hunger Vital Sign    Worried About Running Out of Food in the Last Year: Never true    Ran Out of Food in the Last Year: Never true  Transportation Needs: No Transportation Needs (05/25/2022)   PRAPARE - Administrator, Civil Service (Medical): No    Lack of Transportation (Non-Medical): No  Physical Activity: Insufficiently Active (05/25/2022)   Exercise Vital Sign    Days of Exercise per Week: 3 days    Minutes of Exercise per Session: 30 min  Stress: No Stress Concern Present (05/25/2022)   [employer] of Occupational Health - Occupational Stress Questionnaire    Feeling of Stress : Not at all  Social Connections: Unknown (05/25/2022)   Social Connection and Isolation Panel [NHANES]    Frequency of Communication with Friends and Family: Three times a week    Frequency of Social Gatherings with Friends and Family: Once a week    Attends Religious Services: Not on Marketing executive or Organizations: Yes    Attends Banker Meetings: 1 to 4 times per year    Marital Status: Married    Tobacco Counseling Counseling given: Not Answered Tobacco comments: I have never been a smoker.   Clinical Intake:              How often do you need to have someone help you when you read instructions, pamphlets, or other written materials from your doctor or pharmacy?: 1 - Never  Diabetic?NO         Activities of Daily Living    05/25/2022    5:00 PM  In your present state of health, do you have any difficulty performing the following activities:  Hearing? 1  Vision? 0  Difficulty concentrating or making  decisions? 0  Walking or climbing stairs? 0  Dressing or bathing? 0  Doing errands, shopping? 0  Preparing Food and eating ? N  Using the Toilet? N  In the past six months, have you accidently leaked urine? N  Do you have problems with loss of bowel control? N  Managing your Medications? N  Managing your Finances? N  Housekeeping or managing your Housekeeping? N    Patient Care Team: Jacky Kindle, FNP as PCP - General (Family Medicine) Ether Griffins, [place]  M, MD as Referring Physician (Ophthalmology) Sondra Come, MD as Consulting Physician (Urology) Lockie Mola, MD as Referring Physician (Ophthalmology) Julio Sicks, MD as Consulting Physician (Neurosurgery) Dasher, Cliffton Asters, MD (Dermatology)  Indicate any recent Medical Services you may have received from other than [facility] providers in the past year (date may be approximate).     Assessment:   This is a routine wellness examination for Benjamin Gutierrez.  Hearing/Vision screen Hearing Screening - Comments:: ADEQUATE HEARING:ringing in ears and diminished hearing Needs hearing test  Vision Screening - Comments:: Adequate vision w/glasses Dr Inez Pilgrim  Dietary issues and exercise activities discussed: Current Exercise Habits: Home exercise routine, Type of exercise: walking, Time (Minutes): 30, Frequency (Times/Week): 3, Weekly Exercise (Minutes/Week): 90, Intensity: Mild, Exercise limited by: None identified   Goals Addressed             This Visit's Progress    DIET - EAT MORE FRUITS AND VEGETABLES   On track    DIET - INCREASE WATER INTAKE   On track    Recommend to drink at least 6-8 8oz glasses of water per day.       Depression Screen    05/29/2022    2:17 PM 05/23/2021   11:01 AM 02/28/2021    9:37 AM 05/17/2020    1:42 PM 02/25/2020    9:46 AM 01/14/2018    8:15 AM 11/14/2017    9:41 AM  PHQ 2/9 Scores  PHQ - 2 Score 0 0 0 0 0 0 0  PHQ- 9 Score   0    0    Fall Risk    05/25/2022    5:00 PM 05/23/2021   11:04  AM 05/22/2021   12:32 PM 05/17/2020    1:45 PM 02/25/2020    9:46 AM  Fall Risk   Falls in the past year? 0 0 0 0 0  Number falls in past yr:  0  0 0  Injury with Fall?  0  0 0  Risk for fall due to :  No Fall Risks     Follow up  Falls evaluation completed       FALL RISK PREVENTION PERTAINING TO THE HOME:  Any stairs in or around the home? Yes  If so, are there any without handrails? Yes  Home free of loose throw rugs in walkways, pet beds, electrical cords, etc? Yes  Adequate lighting in your home to reduce risk of falls? Yes   ASSISTIVE DEVICES UTILIZED TO PREVENT FALLS:  Life alert? No  Use of a cane, walker or w/c? No  Grab bars in the bathroom? No  Shower chair or bench in shower? No  Elevated toilet seat or a handicapped toilet? Yes   TIMED UP AND GO:  Was the test performed? Yes .  Length of time to ambulate 10 feet: 10 sec.   Gait slow and steady without use of assistive device  Cognitive Function:        05/29/2022    2:22 PM 10/18/2017   10:45 AM  6CIT Screen  What Year? 0 points 0 points  What month? 0 points 0 points  What time? 0 points 0 points  Count back from 20 0 points 0 points  Months in reverse 0 points 0 points  Repeat phrase 0 points 0 points  Total Score 0 points 0 points    Immunizations Immunization History  Administered Date(s) Administered   Fluad Quad(high Dose 65+) 12/11/2021   H1N1 01/02/2008  Influenza, High Dose Seasonal PF 11/14/2017, 10/21/2019, 11/30/2020   Influenza-Unspecified 11/22/2015, 12/04/2016, 11/30/2020   Moderna Sars-Covid-2 Vaccination 04/03/2019, 05/02/2019, 01/07/2020   PFIZER Comirnaty(Gray Top)Covid-19 Tri-Sucrose Vaccine 03/20/2021   Pneumococcal Conjugate-13 05/28/2014   Pneumococcal Polysaccharide-23 11/22/2015   Td 11/22/2004   Tdap 10/10/2010   Zoster Recombinat (Shingrix) 08/28/2017   Zoster, Live 09/01/2010, 12/25/2012    TDAP status: Up to date  Flu Vaccine status: Up to date  Pneumococcal  vaccine status: Up to date  Covid-19 vaccine status: Completed vaccines  Qualifies for Shingles Vaccine? Yes   Zostavax completed No   Shingrix Completed?: No.    Education has been provided regarding the importance of this vaccine. Patient has been advised to call insurance company to determine out of pocket expense if they have not yet received this vaccine. Advised may also receive vaccine at local pharmacy or Health Dept. Verbalized acceptance and understanding.  Screening Tests Health Maintenance  Topic Date Due   Zoster Vaccines- Shingrix (2 of 2) 10/23/2017   DTaP/Tdap/Td (3 - Td or Tdap) 10/09/2020   COVID-19 Vaccine (5 - 2023-24 season) 10/20/2021   INFLUENZA VACCINE  09/20/2022   Medicare Annual Wellness (AWV)  05/29/2023   COLONOSCOPY (Pts 45-73yrs Insurance coverage will need to be confirmed)  06/08/2027   Pneumonia Vaccine 17+ Years old  Completed   Hepatitis C Screening  Completed   HPV VACCINES  Aged Out    Health Maintenance  Health Maintenance Due  Topic Date Due   Zoster Vaccines- Shingrix (2 of 2) 10/23/2017   DTaP/Tdap/Td (3 - Td or Tdap) 10/09/2020   COVID-19 Vaccine (5 - 2023-24 season) 10/20/2021    Colorectal cancer screening: Type of screening: Colonoscopy. Completed yes. Repeat every 5 years  Lung Cancer Screening: (Low Dose CT Chest recommended if Age 89-80 years, 30 pack-year currently smoking OR have quit w/in 15years.) does not qualify.   Lung Cancer Screening Referral: no  Additional Screening:  Hepatitis C Screening: does not qualify; Completed yes  Vision Screening: Recommended annual ophthalmology exams for early detection of glaucoma and other disorders of the eye. Is the patient up to date with their annual eye exam?  Yes  Who is the provider or what is the name of the office in which the patient attends annual eye exams? Dr Inez Pilgrim If pt is not established with a provider, would they like to be referred to a provider to establish  care? No .   Dental Screening: Recommended annual dental exams for proper oral hygiene  Community Resource Referral / Chronic Care Management: CRR required this visit?  No   CCM required this visit?  No    Plan:     I have personally reviewed and noted the following in the patient's chart:   Medical and social history Use of alcohol, tobacco or illicit drugs  Current medications and supplements including opioid prescriptions. Patient is not currently taking opioid prescriptions. Functional ability and status Nutritional status Physical activity Advanced directives List of other physicians Hospitalizations, surgeries, and ER visits in previous 12 months Vitals Screenings to include cognitive, depression, and falls Referrals and appointments  In addition, I have reviewed and discussed with patient certain preventive protocols, quality metrics, and best practice recommendations. A written personalized care plan for preventive services as well as general preventive health recommendations were provided to patient.     Sue Lush, LPN   [id number]   Nurse Notes: The patient states he is doing well and has no concerns or  questions at this time.

## 2022-05-29 NOTE — Patient Instructions (Addendum)
Benjamin Gutierrez , Thank you for taking time to come for your Medicare Wellness Visit. I appreciate your ongoing commitment to your health goals. Please review the following plan we discussed and let me know if I can assist you in the future.   These are the goals we discussed:  Goals      DIET - EAT MORE FRUITS AND VEGETABLES     DIET - INCREASE WATER INTAKE     Recommend to drink at least 6-8 8oz glasses of water per day.        This is a list of the screening recommended for you and due dates:  Health Maintenance  Topic Date Due   Zoster (Shingles) Vaccine (2 of 2) 10/23/2017   DTaP/Tdap/Td vaccine (3 - Td or Tdap) 10/09/2020   COVID-19 Vaccine (5 - 2023-24 season) 10/20/2021   Flu Shot  09/20/2022   Medicare Annual Wellness Visit  05/29/2023   Colon Cancer Screening  06/08/2027   Pneumonia Vaccine  Completed   Hepatitis C Screening: USPSTF Recommendation to screen - Ages 18-79 yo.  Completed   HPV Vaccine  Aged Out    Advanced directives: yes  Conditions/risks identified: none  Next appointment: Follow up in one year for your annual wellness visit. 06/03/2023 @3pm  in-person  Preventive Care 74 Years and Older, Male  Preventive care refers to lifestyle choices and visits with your health care provider that can promote health and wellness. What does preventive care include? A yearly physical exam. This is also called an annual well check. Dental exams once or twice a year. Routine eye exams. Ask your health care provider how often you should have your eyes checked. Personal lifestyle choices, including: Daily care of your teeth and gums. Regular physical activity. Eating a healthy diet. Avoiding tobacco and drug use. Limiting alcohol use. Practicing safe sex. Taking low doses of aspirin every day. Taking vitamin and mineral supplements as recommended by your health care provider. What happens during an annual well check? The services and screenings done by your health  care provider during your annual well check will depend on your age, overall health, lifestyle risk factors, and family history of disease. Counseling  Your health care provider may ask you questions about your: Alcohol use. Tobacco use. Drug use. Emotional well-being. Home and relationship well-being. Sexual activity. Eating habits. History of falls. Memory and ability to understand (cognition). Work and work Astronomer. Screening  You may have the following tests or measurements: Height, weight, and BMI. Blood pressure. Lipid and cholesterol levels. These may be checked every 5 years, or more frequently if you are over 74 years old. Skin check. Lung cancer screening. You may have this screening every year starting at age 63 if you have a 30-pack-year history of smoking and currently smoke or have quit within the past 15 years. Fecal occult blood test (FOBT) of the stool. You may have this test every year starting at age 74. Flexible sigmoidoscopy or colonoscopy. You may have a sigmoidoscopy every 5 years or a colonoscopy every 10 years starting at age 74. Prostate cancer screening. Recommendations will vary depending on your family history and other risks. Hepatitis C blood test. Hepatitis B blood test. Sexually transmitted disease (STD) testing. Diabetes screening. This is done by checking your blood sugar (glucose) after you have not eaten for a while (fasting). You may have this done every 1-3 years. Abdominal aortic aneurysm (AAA) screening. You may need this if you are a current or former smoker.  Osteoporosis. You may be screened starting at age 74 if you are at high risk. Talk with your health care provider about your test results, treatment options, and if necessary, the need for more tests. Vaccines  Your health care provider may recommend certain vaccines, such as: Influenza vaccine. This is recommended every year. Tetanus, diphtheria, and acellular pertussis (Tdap, Td)  vaccine. You may need a Td booster every 10 years. Zoster vaccine. You may need this after age 65. Pneumococcal 13-valent conjugate (PCV13) vaccine. One dose is recommended after age 74. Pneumococcal polysaccharide (PPSV23) vaccine. One dose is recommended after age 74. Talk to your health care provider about which screenings and vaccines you need and how often you need them. This information is not intended to replace advice given to you by your health care provider. Make sure you discuss any questions you have with your health care provider. Document Released: 03/04/2015 Document Revised: 10/26/2015 Document Reviewed: 12/07/2014 Elsevier Interactive Patient Education  2017 [place] Prevention in the Home Falls can cause injuries. They can happen to people of all ages. There are many things you can do to make your home safe and to help prevent falls. What can I do on the outside of my home? Regularly fix the edges of walkways and driveways and fix any cracks. Remove anything that might make you trip as you walk through a door, such as a raised step or threshold. Trim any bushes or trees on the path to your home. Use bright outdoor lighting. Clear any walking paths of anything that might make someone trip, such as rocks or tools. Regularly check to see if handrails are loose or broken. Make sure that both sides of any steps have handrails. Any raised decks and porches should have guardrails on the edges. Have any leaves, snow, or ice cleared regularly. Use sand or salt on walking paths during winter. Clean up any spills in your garage right away. This includes oil or grease spills. What can I do in the bathroom? Use night lights. Install grab bars by the toilet and in the tub and shower. Do not use towel bars as grab bars. Use non-skid mats or decals in the tub or shower. If you need to sit down in the shower, use a plastic, non-slip stool. Keep the floor dry. Clean up any  water that spills on the floor as soon as it happens. Remove soap buildup in the tub or shower regularly. Attach bath mats securely with double-sided non-slip rug tape. Do not have throw rugs and other things on the floor that can make you trip. What can I do in the bedroom? Use night lights. Make sure that you have a light by your bed that is easy to reach. Do not use any sheets or blankets that are too big for your bed. They should not hang down onto the floor. Have a firm chair that has side arms. You can use this for support while you get dressed. Do not have throw rugs and other things on the floor that can make you trip. What can I do in the kitchen? Clean up any spills right away. Avoid walking on wet floors. Keep items that you use a lot in easy-to-reach places. If you need to reach something above you, use a strong step stool that has a grab bar. Keep electrical cords out of the way. Do not use floor polish or wax that makes floors slippery. If you must use wax, use non-skid floor  wax. Do not have throw rugs and other things on the floor that can make you trip. What can I do with my stairs? Do not leave any items on the stairs. Make sure that there are handrails on both sides of the stairs and use them. Fix handrails that are broken or loose. Make sure that handrails are as long as the stairways. Check any carpeting to make sure that it is firmly attached to the stairs. Fix any carpet that is loose or worn. Avoid having throw rugs at the top or bottom of the stairs. If you do have throw rugs, attach them to the floor with carpet tape. Make sure that you have a light switch at the top of the stairs and the bottom of the stairs. If you do not have them, ask someone to add them for you. What else can I do to help prevent falls? Wear shoes that: Do not have high heels. Have rubber bottoms. Are comfortable and fit you well. Are closed at the toe. Do not wear sandals. If you use a  stepladder: Make sure that it is fully opened. Do not climb a closed stepladder. Make sure that both sides of the stepladder are locked into place. Ask someone to hold it for you, if possible. Clearly mark and make sure that you can see: Any grab bars or handrails. First and last steps. Where the edge of each step is. Use tools that help you move around (mobility aids) if they are needed. These include: Canes. Walkers. Scooters. Crutches. Turn on the lights when you go into a [place] area. Replace any light bulbs as soon as they burn out. Set up your furniture so you have a clear path. Avoid moving your furniture around. If any of your floors are uneven, fix them. If there are any pets around you, be aware of where they are. Review your medicines with your doctor. Some medicines can make you feel dizzy. This can increase your chance of falling. Ask your doctor what other things that you can do to help prevent falls. This information is not intended to replace advice given to you by your health care provider. Make sure you discuss any questions you have with your health care provider. Document Released: 12/02/2008 Document Revised: 07/14/2015 Document Reviewed: 03/12/2014 Elsevier Interactive Patient Education  2017 [employer].

## 2022-05-31 DIAGNOSIS — M5412 Radiculopathy, cervical region: Secondary | ICD-10-CM | POA: Diagnosis not present

## 2022-05-31 DIAGNOSIS — Z6828 Body mass index (BMI) 28.0-28.9, adult: Secondary | ICD-10-CM | POA: Diagnosis not present

## 2022-06-02 DIAGNOSIS — G4733 Obstructive sleep apnea (adult) (pediatric): Secondary | ICD-10-CM | POA: Diagnosis not present

## 2022-06-04 ENCOUNTER — Other Ambulatory Visit: Payer: PPO

## 2022-06-07 ENCOUNTER — Encounter: Payer: Self-pay | Admitting: Urology

## 2022-06-07 ENCOUNTER — Ambulatory Visit: Payer: PPO | Admitting: Urology

## 2022-06-07 VITALS — BP 123/73 | HR 79 | Ht 68.0 in | Wt 189.7 lb

## 2022-06-07 DIAGNOSIS — Z125 Encounter for screening for malignant neoplasm of prostate: Secondary | ICD-10-CM

## 2022-06-07 NOTE — Progress Notes (Signed)
   06/07/2022 2:15 PM   Benjamin Gutierrez [birth year] [id number]  Reason for visit: Follow up BPH, nocturia, history recurrent UTI, PSA screening   HPI: He is a relatively healthy 72 year old male with a family history of prostate cancer in his brother, who we have been following for the above issues.  In terms of his PSA screening, PSA has been stable at ~1 for the last few years, and was most recently 1 in April 2024.  We previously reviewed the AUA guidelines regarding recommendations against routine screening in men over age 55, however with his family history and otherwise good health he would like to continue PSA screening every 1 to 2 years at least for another few years.    He denies any UTIs in the last year.  He previously was having nocturia 3-4 times overnight and I had previously recommended sleep apnea evaluation.  He was recently diagnosed with sleep apnea and has started using CPAP, and nocturia has improved to 0-1 time overnight.  Of note he also trialed Flomax in the past previously with severe lightheadedness/dizziness.  He denies any urinary complaints today.  RTC 1 year with PSA prior  Sondra Come, MD  [facility] [address] [place], [place] [address] [phone]

## 2022-06-11 NOTE — Progress Notes (Unsigned)
I,J'ya E Wendal Wilkie,acting as a scribe for Jacky Kindle, FNP.,have documented all relevant documentation on the behalf of Jacky Kindle, FNP,as directed by  Jacky Kindle, FNP while in the presence of Jacky Kindle, FNP.   Complete physical exam   Patient: Benjamin Gutierrez   DOB: 10/13/1950   72 y.o. Male  MRN: [id number] Visit Date: 06/12/2022  Today's healthcare provider: Jacky Kindle, FNP  Re Introduced to nurse practitioner role and practice setting.  All questions answered.  Discussed provider/patient relationship and expectations.  Chief Complaint  Patient presents with   Annual Exam   Subjective    Benjamin Gutierrez is a 72 y.o. male who presents today for a complete physical exam.  He reports consuming a general diet. The patient does not participate in regular exercise at present. He generally feels well. He reports sleeping well. He does not have additional problems to discuss today.  HPI   Past Medical History:  Diagnosis Date   Acid reflux 08/06/2014   Allergy    Anxiety 08/06/2014   Arthritis    Blood in feces 08/06/2014   Brachial neuritis 01/02/2008   Cancer    forehead   Decreased libido 08/06/2014   Dizziness and giddiness 08/06/2014   ED (erectile dysfunction) of organic origin 08/06/2014   GERD (gastroesophageal reflux disease)    H/O arthrodesis 08/06/2014   Leg varices 08/06/2014   Past Surgical History:  Procedure Laterality Date   CARDIAC CATHETERIZATION  1993   CERVICAL SPINE SURGERY  2009   radiculopathy from past fusion and arthritic changes   COLONOSCOPY  2013   COLONOSCOPY WITH PROPOFOL N/A 06/07/2020   Procedure: COLONOSCOPY WITH PROPOFOL;  Surgeon: Midge Minium, MD;  Location: [facility] ENDOSCOPY;  Service: Endoscopy;  Laterality: N/A;   ESOPHAGOGASTRODUODENOSCOPY ENDOSCOPY     EYE SURGERY  01/02/2018   right eye lid mole removed   LAMINECTOMY  2005   PAROTIDECTOMY Left 12/13/2016   Procedure: TOTAL PAROTIDECTOMY WITH FACIAL NERVE  MONITORING;  Surgeon: Bud Face, MD;  Location: [facility] ORS;  Service: ENT;  Laterality: Left;   SPINE SURGERY  2005     2009    2016   Neck surgery   Social History   Socioeconomic History   Marital status: Married    Spouse name: Darel Hong   Number of children: 2   Years of education: 12   Highest education level: High school graduate  Occupational History   Occupation: Retired  Tobacco Use   Smoking status: Never   Smokeless tobacco: Never   Tobacco comments:    I have never been a smoker.  Vaping Use   Vaping Use: Never used  Substance and Sexual Activity   Alcohol use: Never   Drug use: No   Sexual activity: Not Currently    Birth control/protection: None  Other Topics Concern   Not on file  Social History Narrative   Not on file   Social Determinants of Health   Financial Resource Strain: Low Risk  (05/25/2022)   Overall Financial Resource Strain (CARDIA)    Difficulty of Paying Living Expenses: Not hard at all  Food Insecurity: No Food Insecurity (05/25/2022)   Hunger Vital Sign    Worried About Running Out of Food in the Last Year: Never true    Ran Out of Food in the Last Year: Never true  Transportation Needs: No Transportation Needs (05/25/2022)   PRAPARE - Administrator, Civil Service (Medical):  No    Lack of Transportation (Non-Medical): No  Physical Activity: Insufficiently Active (05/25/2022)   Exercise Vital Sign    Days of Exercise per Week: 3 days    Minutes of Exercise per Session: 30 min  Stress: No Stress Concern Present (05/25/2022)   [employer] of Occupational Health - Occupational Stress Questionnaire    Feeling of Stress : Not at all  Social Connections: Unknown (05/25/2022)   Social Connection and Isolation Panel [NHANES]    Frequency of Communication with Friends and Family: Three times a week    Frequency of Social Gatherings with Friends and Family: Once a week    Attends Religious Services: Not on file    Active Member of  Clubs or Organizations: Yes    Attends Banker Meetings: 1 to 4 times per year    Marital Status: Married  Catering manager Violence: Not At Risk (05/29/2022)   Humiliation, Afraid, Rape, and Kick questionnaire    Fear of Current or Ex-Partner: No    Emotionally Abused: No    Physically Abused: No    Sexually Abused: No   Family Status  Relation Name Status   Mother Sevon Rotert Deceased   Father  Deceased   Brother  Alive   Daughter  Alive   Son  Alive   Mat Uncle  Deceased   PGM  Deceased   Family History  Problem Relation Age of Onset   Esophageal cancer Mother    Arthritis Mother    Cancer Mother    Alcohol abuse Father    Prostate cancer Brother    Parkinson's disease Maternal Uncle    Dementia Paternal Grandmother    Allergies  Allergen Reactions   Betadine [Povidone Iodine] Itching   Fentanyl Other (See Comments)    Pain patch and after passed out and admitted to the hospital   Penicillins Rash    Patient Care Team: Jacky Kindle, FNP as PCP - General (Family Medicine) Imagene Riches, MD as Referring Physician (Ophthalmology) Sondra Come, MD as Consulting Physician (Urology) Lockie Mola, MD as Referring Physician (Ophthalmology) Julio Sicks, MD as Consulting Physician (Neurosurgery) Dasher, Cliffton Asters, MD (Dermatology)   Medications: Outpatient Medications Prior to Visit  Medication Sig   acetaminophen (TYLENOL) 500 MG tablet Take 500 mg by mouth every 6 (six) hours as needed.   dexlansoprazole (DEXILANT) 60 MG capsule Take 1 capsule (60 mg total) by mouth daily.   fluticasone (FLONASE) 50 MCG/ACT nasal spray Place 2 sprays into both nostrils daily.   metoprolol tartrate (LOPRESSOR) 25 MG tablet Take 1 tablet (25 mg total) by mouth 2 (two) times daily.   No facility-administered medications prior to visit.    Review of Systems  Neurological:  Positive for numbness.  Psychiatric/Behavioral:  Positive for sleep disturbance.      Objective    BP 123/73 Comment: BP earlier this week when not rushing to appt time  Pulse 60   Temp 97.8 F (36.6 C) (Oral)   Ht 5\' 8"  (1.727 m)   Wt 187 lb 9.6 oz (85.1 kg)   SpO2 98%   BMI 28.52 kg/m    Physical Exam Vitals and nursing note reviewed.  Constitutional:      General: He is awake. He is not in acute distress.    Appearance: Normal appearance. He is well-developed, well-groomed and overweight. He is not ill-appearing, toxic-appearing or diaphoretic.  HENT:     Head: Normocephalic and atraumatic.  Jaw: There is normal jaw occlusion. No trismus, tenderness, swelling or pain on movement.     Salivary Glands: Right salivary gland is not diffusely enlarged or tender. Left salivary gland is not diffusely enlarged or tender.     Right Ear: Hearing, tympanic membrane, ear canal and external ear normal. There is no impacted cerumen.     Left Ear: Hearing, tympanic membrane, ear canal and external ear normal. There is no impacted cerumen.     Nose: Nose normal. No congestion or rhinorrhea.     Right Turbinates: Not enlarged, swollen or pale.     Left Turbinates: Not enlarged, swollen or pale.     Right Sinus: No maxillary sinus tenderness or frontal sinus tenderness.     Left Sinus: No maxillary sinus tenderness or frontal sinus tenderness.     Mouth/Throat:     Lips: Pink.     Mouth: Mucous membranes are moist. No injury, lacerations, oral lesions or angioedema.     Pharynx: Oropharynx is clear. Uvula midline. No pharyngeal swelling, oropharyngeal exudate or posterior oropharyngeal erythema.     Tonsils: No tonsillar exudate or tonsillar abscesses.  Eyes:     General: Lids are normal. Vision grossly intact. Gaze aligned appropriately.        Right eye: No discharge.        Left eye: No discharge.     Extraocular Movements: Extraocular movements intact.     Conjunctiva/sclera: Conjunctivae normal.     Pupils: Pupils are equal, round, and reactive to light.  Neck:      Thyroid: No thyroid mass, thyromegaly or thyroid tenderness.     Vascular: No carotid bruit.     Trachea: Trachea normal. No tracheal tenderness.  Cardiovascular:     Rate and Rhythm: Normal rate and regular rhythm.     Pulses: Normal pulses.          Carotid pulses are 2+ on the right side and 2+ on the left side.      Radial pulses are 2+ on the right side and 2+ on the left side.       Femoral pulses are 2+ on the right side and 2+ on the left side.      Popliteal pulses are 2+ on the right side and 2+ on the left side.       Dorsalis pedis pulses are 2+ on the right side and 2+ on the left side.       Posterior tibial pulses are 2+ on the right side and 2+ on the left side.     Heart sounds: Normal heart sounds, S1 normal and S2 normal. No murmur heard.    No friction rub. No gallop.  Pulmonary:     Effort: Pulmonary effort is normal. No respiratory distress.     Breath sounds: Normal breath sounds and air entry. No stridor. No wheezing, rhonchi or rales.  Chest:     Chest wall: No tenderness.  Abdominal:     General: Abdomen is flat. Bowel sounds are normal. There is no distension.     Palpations: Abdomen is soft. There is no mass.     Tenderness: There is no abdominal tenderness. There is no guarding or rebound.     Hernia: No hernia is present.  Genitourinary:    Comments: Exam deferred; denies complaints Musculoskeletal:        General: No swelling, tenderness, deformity or signs of injury. Normal range of motion.     Cervical back:  Normal range of motion and neck supple. No rigidity or tenderness.     Right lower leg: No edema.     Left lower leg: No edema.  Lymphadenopathy:     Cervical: No cervical adenopathy.     Right cervical: No superficial, deep or posterior cervical adenopathy.    Left cervical: No superficial, deep or posterior cervical adenopathy.  Skin:    General: Skin is warm and dry.     Capillary Refill: Capillary refill takes less than 2 seconds.      Coloration: Skin is not jaundiced or pale.     Findings: No bruising, erythema, lesion or rash.  Neurological:     General: No focal deficit present.     Mental Status: He is alert and oriented to person, place, and time. Mental status is at baseline.     GCS: GCS eye subscore is 4. GCS verbal subscore is 5. GCS motor subscore is 6.     Sensory: Sensation is intact. No sensory deficit.     Motor: Motor function is intact. No weakness.     Coordination: Coordination is intact.     Gait: Gait is intact.  Psychiatric:        Attention and Perception: Attention and perception normal.        Mood and Affect: Mood and affect normal.        Speech: Speech normal.        Behavior: Behavior normal. Behavior is cooperative.        Thought Content: Thought content normal.        Cognition and Memory: Cognition normal.        Judgment: Judgment normal.     Last depression screening scores    06/12/2022    3:39 PM 05/29/2022    2:17 PM 05/23/2021   11:01 AM  PHQ 2/9 Scores  PHQ - 2 Score 0 0 0  PHQ- 9 Score 0     Last fall risk screening    06/12/2022    3:39 PM  Fall Risk   Falls in the past year? 0  Number falls in past yr: 0  Injury with Fall? 0  Risk for fall due to : No Fall Risks   Last Audit-C alcohol use screening    06/12/2022    3:39 PM  Alcohol Use Disorder Test (AUDIT)  1. How often do you have a drink containing alcohol? 0   A score of 3 or more in women, and 4 or more in men indicates increased risk for alcohol abuse, EXCEPT if all of the points are from question 1   No results found for any visits on 06/12/22.  Assessment & Plan    Routine Health Maintenance and Physical Exam  Exercise Activities and Dietary recommendations  Goals      DIET - EAT MORE FRUITS AND VEGETABLES     DIET - INCREASE WATER INTAKE     Recommend to drink at least 6-8 8oz glasses of water per day.        Immunization History  Administered Date(s) Administered   Fluad Quad(high Dose  65+) 12/11/2021   H1N1 01/02/2008   Influenza, High Dose Seasonal PF 11/14/2017, 10/21/2019, 11/30/2020   Influenza-Unspecified 11/22/2015, 12/04/2016, 11/30/2020   Moderna Sars-Covid-2 Vaccination 04/03/2019, 05/02/2019, 01/07/2020   PFIZER Comirnaty(Gray Top)Covid-19 Tri-Sucrose Vaccine 03/20/2021   Pneumococcal Conjugate-13 05/28/2014   Pneumococcal Polysaccharide-23 11/22/2015   Td 11/22/2004   Tdap 10/10/2010   Zoster Recombinat (Shingrix) 09/12/2017, 01/14/2018  Zoster, Live 09/01/2010    Health Maintenance  Topic Date Due   DTaP/Tdap/Td (3 - Td or Tdap) 10/09/2020   COVID-19 Vaccine (5 - 2023-24 season) 10/20/2021   INFLUENZA VACCINE  09/20/2022   Medicare Annual Wellness (AWV)  05/29/2023   COLONOSCOPY (Pts 45-49yrs Insurance coverage will need to be confirmed)  06/08/2027   Pneumonia Vaccine 61+ Years old  Completed   Hepatitis C Screening  Completed   Zoster Vaccines- Shingrix  Completed   HPV VACCINES  Aged Out    Discussed health benefits of physical activity, and encouraged him to engage in regular exercise appropriate for his age and condition.  Problem List Items Addressed This Visit       Cardiovascular and Mediastinum   Primary hypertension    Chronic, well controlled previously on metop 25 mg BID BP initially elevated upon arrival as pt noted running late to appt Continue to monitor      Relevant Orders   Comprehensive Metabolic Panel (CMET)   CBC with Differential/Platelet   Lipid panel     Digestive   GERD (gastroesophageal reflux disease)    Chronic, stable Wishes to continue dexilant at 60 mg Denies current concerns or exacerbations         Other   Annual physical exam - Primary    Recommend vision and dental screenings Declines vaccinations at this time Things to do to keep yourself healthy  - Exercise at least 30-45 minutes a day, 3-4 days a week.  - Eat a low-fat diet with lots of fruits and vegetables, up to 7-9 servings per day.   - Seatbelts can save your life. Wear them always.  - Smoke detectors on every level of your home, check batteries every year.  - Eye Doctor - have an eye exam every 1-2 years  - Safe sex - if you may be exposed to STDs, use a condom.  - Alcohol -  If you drink, do it moderately, less than 2 drinks per day.  - Health Care Power of Attorney. Choose someone to speak for you if you are not able.  - Depression is common in our stressful world.If you're feeling down or losing interest in things you normally enjoy, please come in for a visit.  - Violence - If anyone is threatening or hurting you, please call immediately.       Relevant Orders   Comprehensive Metabolic Panel (CMET)   CBC with Differential/Platelet   Lipid panel   Return in about 1 year (around 06/12/2023) for annual examination.    Leilani Merl, FNP, have reviewed all documentation for this visit. The documentation on 06/12/22 for the exam, diagnosis, procedures, and orders are all accurate and complete.  Jacky Kindle, FNP  [facility] [phone] (phone) [phone] (fax)  [facility]

## 2022-06-12 ENCOUNTER — Ambulatory Visit (INDEPENDENT_AMBULATORY_CARE_PROVIDER_SITE_OTHER): Payer: PPO | Admitting: Family Medicine

## 2022-06-12 ENCOUNTER — Encounter: Payer: Self-pay | Admitting: Family Medicine

## 2022-06-12 VITALS — BP 123/73 | HR 60 | Temp 97.8°F | Ht 68.0 in | Wt 187.6 lb

## 2022-06-12 DIAGNOSIS — K2100 Gastro-esophageal reflux disease with esophagitis, without bleeding: Secondary | ICD-10-CM | POA: Diagnosis not present

## 2022-06-12 DIAGNOSIS — I10 Essential (primary) hypertension: Secondary | ICD-10-CM | POA: Diagnosis not present

## 2022-06-12 DIAGNOSIS — Z0000 Encounter for general adult medical examination without abnormal findings: Secondary | ICD-10-CM

## 2022-06-12 NOTE — Assessment & Plan Note (Signed)
Chronic, well controlled previously on metop 25 mg BID BP initially elevated upon arrival as pt noted running late to appt Continue to monitor

## 2022-06-12 NOTE — Assessment & Plan Note (Signed)
Chronic, stable Wishes to continue dexilant at 60 mg Denies current concerns or exacerbations

## 2022-06-12 NOTE — Assessment & Plan Note (Signed)
Recommend vision and dental screenings Declines vaccinations at this time Things to do to keep yourself healthy  - Exercise at least 30-45 minutes a day, 3-4 days a week.  - Eat a low-fat diet with lots of fruits and vegetables, up to 7-9 servings per day.  - Seatbelts can save your life. Wear them always.  - Smoke detectors on every level of your home, check batteries every year.  - Eye Doctor - have an eye exam every 1-2 years  - Safe sex - if you may be exposed to STDs, use a condom.  - Alcohol -  If you drink, do it moderately, less than 2 drinks per day.  - Health Care Power of Attorney. Choose someone to speak for you if you are not able.  - Depression is common in our stressful world.If you're feeling down or losing interest in things you normally enjoy, please come in for a visit.  - Violence - If anyone is threatening or hurting you, please call immediately.

## 2022-06-13 ENCOUNTER — Ambulatory Visit (INDEPENDENT_AMBULATORY_CARE_PROVIDER_SITE_OTHER): Payer: PPO | Admitting: Pulmonary Disease

## 2022-06-13 ENCOUNTER — Encounter: Payer: Self-pay | Admitting: Pulmonary Disease

## 2022-06-13 ENCOUNTER — Other Ambulatory Visit: Payer: Self-pay | Admitting: Family Medicine

## 2022-06-13 VITALS — BP 110/68 | HR 72 | Ht 68.0 in | Wt 188.8 lb

## 2022-06-13 DIAGNOSIS — G4733 Obstructive sleep apnea (adult) (pediatric): Secondary | ICD-10-CM

## 2022-06-13 LAB — COMPREHENSIVE METABOLIC PANEL
ALT: 22 IU/L (ref 0–44)
AST: 22 IU/L (ref 0–40)
Albumin/Globulin Ratio: 1.7 (ref 1.2–2.2)
Albumin: 4.2 g/dL (ref 3.8–4.8)
Alkaline Phosphatase: 70 IU/L (ref 44–121)
BUN/Creatinine Ratio: 10 (ref 10–24)
BUN: 12 mg/dL (ref 8–27)
Bilirubin Total: 0.9 mg/dL (ref 0.0–1.2)
CO2: 24 mmol/L (ref 20–29)
Calcium: 9.5 mg/dL (ref 8.6–10.2)
Chloride: 104 mmol/L (ref 96–106)
Creatinine, Ser: 1.18 mg/dL (ref 0.76–1.27)
Globulin, Total: 2.5 g/dL (ref 1.5–4.5)
Glucose: 86 mg/dL (ref 70–99)
Potassium: 4.4 mmol/L (ref 3.5–5.2)
Sodium: 143 mmol/L (ref 134–144)
Total Protein: 6.7 g/dL (ref 6.0–8.5)
eGFR: 65 mL/min/{1.73_m2} (ref >59)

## 2022-06-13 LAB — CBC WITH DIFFERENTIAL/PLATELET
Basophils Absolute: 0 10*3/uL (ref 0.0–0.2)
Basos: 1 %
EOS (ABSOLUTE): 0.1 10*3/uL (ref 0.0–0.4)
Eos: 1 %
Hematocrit: 42.4 % (ref 37.5–51.0)
Hemoglobin: 14.3 g/dL (ref 13.0–17.7)
Immature Grans (Abs): 0 10*3/uL (ref 0.0–0.1)
Immature Granulocytes: 0 %
Lymphocytes Absolute: 2.3 10*3/uL (ref 0.7–3.1)
Lymphs: 30 %
MCH: 30.1 pg (ref 26.6–33.0)
MCHC: 33.7 g/dL (ref 31.5–35.7)
MCV: 89 fL (ref 79–97)
Monocytes Absolute: 0.7 10*3/uL (ref 0.1–0.9)
Monocytes: 8 %
Neutrophils Absolute: 4.8 10*3/uL (ref 1.4–7.0)
Neutrophils: 60 %
Platelets: 170 10*3/uL (ref 150–450)
RBC: 4.75 x10E6/uL (ref 4.14–5.80)
RDW: 12.4 % (ref 11.6–15.4)
WBC: 7.9 10*3/uL (ref 3.4–10.8)

## 2022-06-13 LAB — LIPID PANEL
Chol/HDL Ratio: 3.4 ratio (ref 0.0–5.0)
Cholesterol, Total: 163 mg/dL (ref 100–199)
HDL: 48 mg/dL (ref >39)
LDL Chol Calc (NIH): 96 mg/dL (ref 0–99)
Triglycerides: 101 mg/dL (ref 0–149)
VLDL Cholesterol Cal: 19 mg/dL (ref 5–40)

## 2022-06-13 MED ORDER — ROSUVASTATIN CALCIUM 20 MG PO TABS: 20.0000 mg | ORAL_TABLET | Freq: Every day | ORAL | 3 refills | Status: DC

## 2022-06-13 NOTE — Progress Notes (Signed)
All labs are normal and stable; risk of heart attack and/or stroke is elevated at 22% if you would like to begin statin regimen to assist diet/exercise.   The 10-year ASCVD risk score (Arnett DK, et al., 2019) is: 22.3%   Values used to calculate the score:     Age: 72 years     Sex: Male     Is Non-Hispanic African American: No     Diabetic: No     Tobacco smoker: No     Systolic Blood Pressure: 123 mmHg     Is BP treated: Yes     HDL Cholesterol: 48 mg/dL     Total Cholesterol: 163 mg/dL

## 2022-06-13 NOTE — Progress Notes (Signed)
Benjamin Gutierrez    [id number]    Dec 04, 1950  Primary Care Physician:Payne, Daryl Eastern, FNP  Referring Physician: Jacky Kindle, FNP [address],  [place] [address]  Chief complaint:   Nonrestorative sleep, multiple awakenings Found to have mild sleep apnea  HPI:  Mild sleep apnea Has been using CPAP Took a manage little bit to get used to it but he is using it nightly at the present time Benefits from CPAP use Wakes up feeling like he got a good nights rest  Less number of awakenings at night  Wakes up feeling restored  Usually goes to bed between 10 and 11 Falls asleep in about 30 minutes 2-3 awakenings Final wake up time between 9 and 10 AM  No significant snoring with machine on No night sweats  Remembers that his parents snored  He does have some sleepiness during the day States that his wife tells him that anytime he sits not doing anything will fall asleep  Does not drink too late into the evening  Never smoker   Outpatient Encounter Medications as of 06/13/2022  Medication Sig   rosuvastatin (CRESTOR) 20 MG tablet Take 1 tablet (20 mg total) by mouth daily.   acetaminophen (TYLENOL) 500 MG tablet Take 500 mg by mouth every 6 (six) hours as needed.   dexlansoprazole (DEXILANT) 60 MG capsule Take 1 capsule (60 mg total) by mouth daily.   fluticasone (FLONASE) 50 MCG/ACT nasal spray Place 2 sprays into both nostrils daily.   metoprolol tartrate (LOPRESSOR) 25 MG tablet Take 1 tablet (25 mg total) by mouth 2 (two) times daily.   No facility-administered encounter medications on file as of 06/13/2022.    Allergies as of 06/13/2022 - Review Complete 06/12/2022  Allergen Reaction Noted   Betadine [povidone iodine] Itching 08/06/2014   Fentanyl Other (See Comments) 11/15/2016   Penicillins Rash 08/06/2014    Past Medical History:  Diagnosis Date   Acid reflux 08/06/2014   Allergy    Anxiety 08/06/2014   Arthritis    Blood in  feces 08/06/2014   Brachial neuritis 01/02/2008   Cancer    forehead   Decreased libido 08/06/2014   Dizziness and giddiness 08/06/2014   ED (erectile dysfunction) of organic origin 08/06/2014   GERD (gastroesophageal reflux disease)    H/O arthrodesis 08/06/2014   Leg varices 08/06/2014    Past Surgical History:  Procedure Laterality Date   CARDIAC CATHETERIZATION  1993   CERVICAL SPINE SURGERY  2009   radiculopathy from past fusion and arthritic changes   COLONOSCOPY  2013   COLONOSCOPY WITH PROPOFOL N/A 06/07/2020   Procedure: COLONOSCOPY WITH PROPOFOL;  Surgeon: Midge Minium, MD;  Location: [facility] ENDOSCOPY;  Service: Endoscopy;  Laterality: N/A;   ESOPHAGOGASTRODUODENOSCOPY ENDOSCOPY     EYE SURGERY  01/02/2018   right eye lid mole removed   LAMINECTOMY  2005   PAROTIDECTOMY Left 12/13/2016   Procedure: TOTAL PAROTIDECTOMY WITH FACIAL NERVE MONITORING;  Surgeon: Bud Face, MD;  Location: [facility] ORS;  Service: ENT;  Laterality: Left;   SPINE SURGERY  2005     2009    2016   Neck surgery    Family History  Problem Relation Age of Onset   Esophageal cancer Mother    Arthritis Mother    Cancer Mother    Alcohol abuse Father    Prostate cancer Brother    Parkinson's disease Maternal Uncle    Dementia Paternal Grandmother  Social History   Socioeconomic History   Marital status: Married    Spouse name: Darel Hong   Number of children: 2   Years of education: 12   Highest education level: High school graduate  Occupational History   Occupation: Retired  Tobacco Use   Smoking status: Never   Smokeless tobacco: Never   Tobacco comments:    I have never been a smoker.  Vaping Use   Vaping Use: Never used  Substance and Sexual Activity   Alcohol use: Never   Drug use: No   Sexual activity: Not Currently    Birth control/protection: None  Other Topics Concern   Not on file  Social History Narrative   Not on file   Social Determinants of Health    Financial Resource Strain: Low Risk  (05/25/2022)   Overall Financial Resource Strain (CARDIA)    Difficulty of Paying Living Expenses: Not hard at all  Food Insecurity: No Food Insecurity (05/25/2022)   Hunger Vital Sign    Worried About Running Out of Food in the Last Year: Never true    Ran Out of Food in the Last Year: Never true  Transportation Needs: No Transportation Needs (05/25/2022)   PRAPARE - Administrator, Civil Service (Medical): No    Lack of Transportation (Non-Medical): No  Physical Activity: Insufficiently Active (05/25/2022)   Exercise Vital Sign    Days of Exercise per Week: 3 days    Minutes of Exercise per Session: 30 min  Stress: No Stress Concern Present (05/25/2022)   [employer] of Occupational Health - Occupational Stress Questionnaire    Feeling of Stress : Not at all  Social Connections: Unknown (05/25/2022)   Social Connection and Isolation Panel [NHANES]    Frequency of Communication with Friends and Family: Three times a week    Frequency of Social Gatherings with Friends and Family: Once a week    Attends Religious Services: Not on Marketing executive or Organizations: Yes    Attends Banker Meetings: 1 to 4 times per year    Marital Status: Married  Catering manager Violence: Not At Risk (05/29/2022)   Humiliation, Afraid, Rape, and Kick questionnaire    Fear of Current or Ex-Partner: No    Emotionally Abused: No    Physically Abused: No    Sexually Abused: No    Review of Systems  Respiratory:  Positive for apnea.   Psychiatric/Behavioral:  Positive for sleep disturbance.     There were no vitals filed for this visit.   Physical Exam Constitutional:      Appearance: Normal appearance.  HENT:     Head: Normocephalic.     Mouth/Throat:     Mouth: Mucous membranes are moist.     Comments: Mallampati 3, crowded oropharynx Cardiovascular:     Rate and Rhythm: Normal rate and regular rhythm.     Heart  sounds: No murmur heard.    No friction rub.  Pulmonary:     Effort: No respiratory distress.     Breath sounds: No stridor. No wheezing or rhonchi.  Musculoskeletal:     Cervical back: No rigidity or tenderness.  Neurological:     Mental Status: He is alert.  Psychiatric:        Mood and Affect: Mood normal.       12/22/2021    2:00 PM  Results of the Epworth flowsheet  Sitting and reading 1  Watching TV 1  Sitting, inactive in a public place (e.g. a theatre or a meeting) 0  As a passenger in a car for an hour without a break 1  Lying down to rest in the afternoon when circumstances permit 1  Sitting and talking to someone 0  Sitting quietly after a lunch without alcohol 0  In a car, while stopped for a few minutes in traffic 0  Total score 4     Data Reviewed: Sleep study reviewed showing mild obstructive sleep apnea  Compliance data reviewed showing excellent compliance with nightly use Average use of 7 hours 39 minutes Machine set between 5 and 15 95 percentile pressure of 14.5 Some air leaks Residual AHI of 4.8  Assessment:  Mild obstructive sleep apnea -Improved symptoms with CPAP -Tolerating CPAP well -Waking up restored  Sleep is described as more restorative   Plan/Recommendations: Continue CPAP  Change pressures from 5-15 to 10-18  Follow-up in 6 months  Encouraged to call with significant concerns  Virl Diamond MD [facility] 06/13/2022, 3:08 PM  CC: Jacky Kindle, FNP

## 2022-06-13 NOTE — Addendum Note (Signed)
Addended by: Lanna Poche on: 06/13/2022 03:42 PM   Modules accepted: Orders

## 2022-06-13 NOTE — Patient Instructions (Signed)
DME referral to change pressure on machine from 5-15 to 10-18  I will see you back in 6 months  Call us with significant concerns  Try moleskin for the bridge of your nose  Call us with significant concerns

## 2022-07-02 DIAGNOSIS — G4733 Obstructive sleep apnea (adult) (pediatric): Secondary | ICD-10-CM | POA: Diagnosis not present

## 2022-07-10 DIAGNOSIS — L570 Actinic keratosis: Secondary | ICD-10-CM | POA: Diagnosis not present

## 2022-07-10 DIAGNOSIS — D2261 Melanocytic nevi of right upper limb, including shoulder: Secondary | ICD-10-CM | POA: Diagnosis not present

## 2022-07-10 DIAGNOSIS — Z08 Encounter for follow-up examination after completed treatment for malignant neoplasm: Secondary | ICD-10-CM | POA: Diagnosis not present

## 2022-07-10 DIAGNOSIS — Z85828 Personal history of other malignant neoplasm of skin: Secondary | ICD-10-CM | POA: Diagnosis not present

## 2022-07-10 DIAGNOSIS — D225 Melanocytic nevi of trunk: Secondary | ICD-10-CM | POA: Diagnosis not present

## 2022-07-10 DIAGNOSIS — D2272 Melanocytic nevi of left lower limb, including hip: Secondary | ICD-10-CM | POA: Diagnosis not present

## 2022-07-10 DIAGNOSIS — D2262 Melanocytic nevi of left upper limb, including shoulder: Secondary | ICD-10-CM | POA: Diagnosis not present

## 2022-07-20 IMAGING — CT CT NECK W/ CM
3 of 4 series · 13 of 34 positions shown, 16 images · IV contrast (omnipaque)
Comparison: 11/15/2016

CLINICAL DATA: Left ear, temporal, and maxillary/submandibular pain
for 1 week.

EXAM:
CT NECK WITH CONTRAST
TECHNIQUE: Multidetector CT imaging of the neck was performed using the
standard protocol following the bolus administration of intravenous
contrast.
CONTRAST:  75mL OMNIPAQUE IOHEXOL 300 MG/ML  SOLN

[Series 5: sag neck · sagittal · 0.47mm/px · 5 of 122 slices shown, 6 images]
[im 41/122  bone]
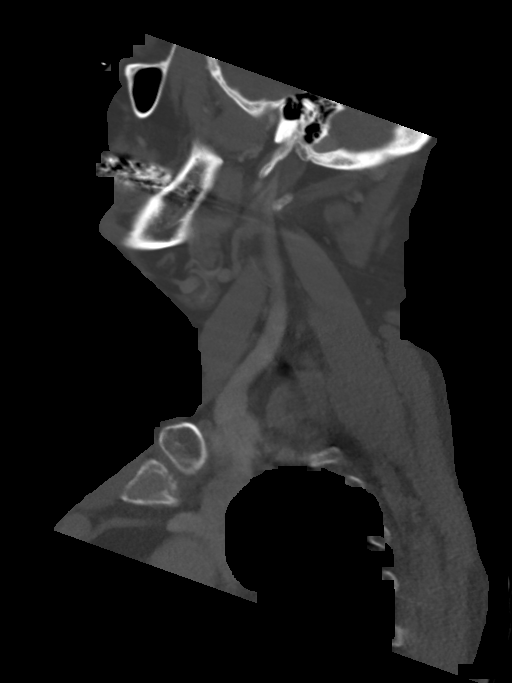
[im 51/122  bone]
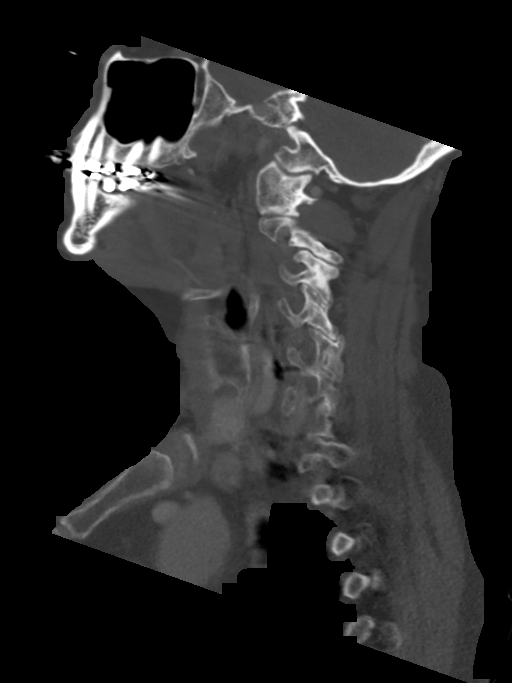
[im 61/122  soft-tissue]
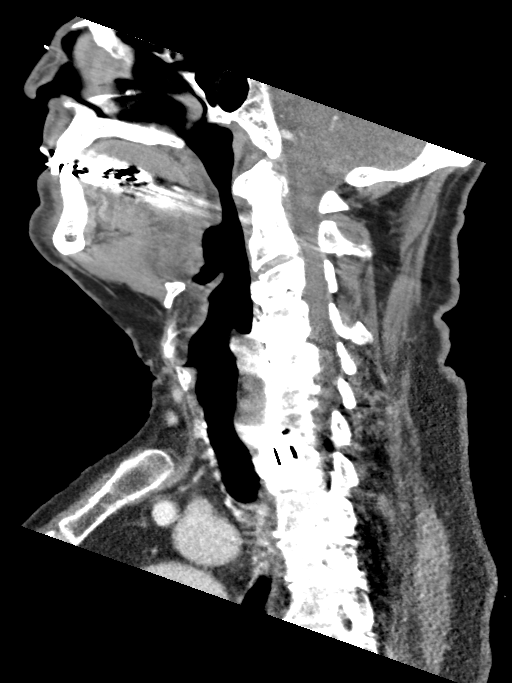
[im 61/122  bone]
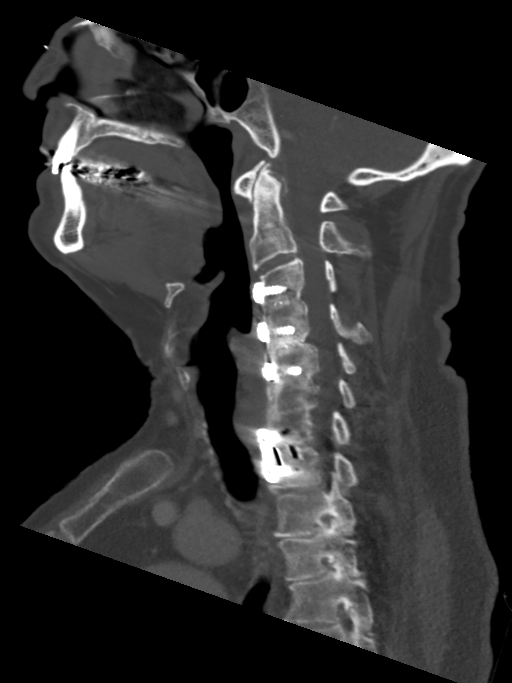
[im 71/122  bone]
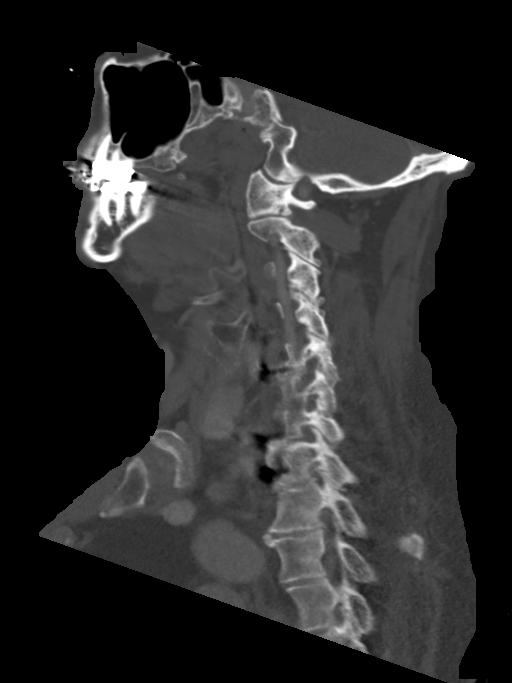
[im 81/122  bone]
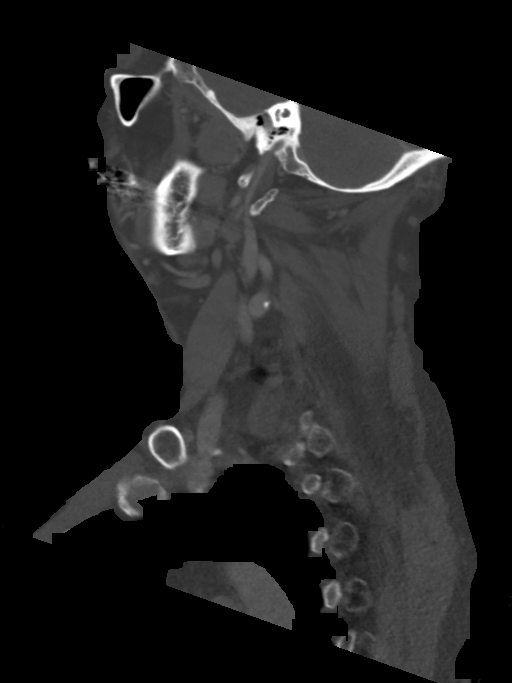

[Series 6: cor neck · coronal · 0.48mm/px · 3 of 120 slices shown]
[im 39/120  bone]
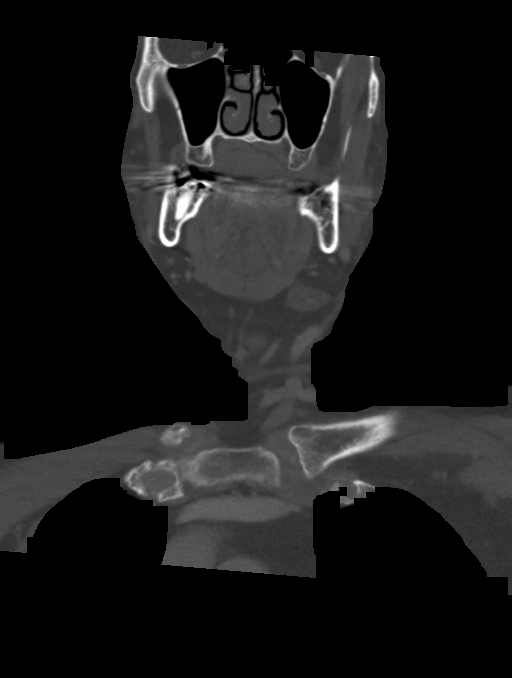
[im 53/120  bone]
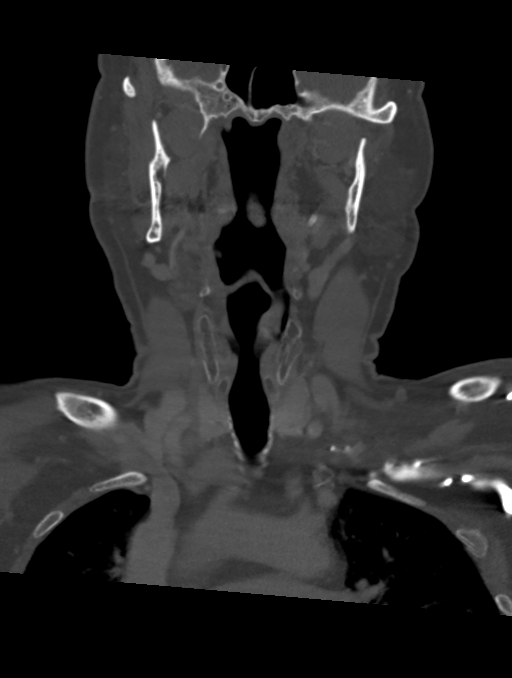
[im 67/120  bone]
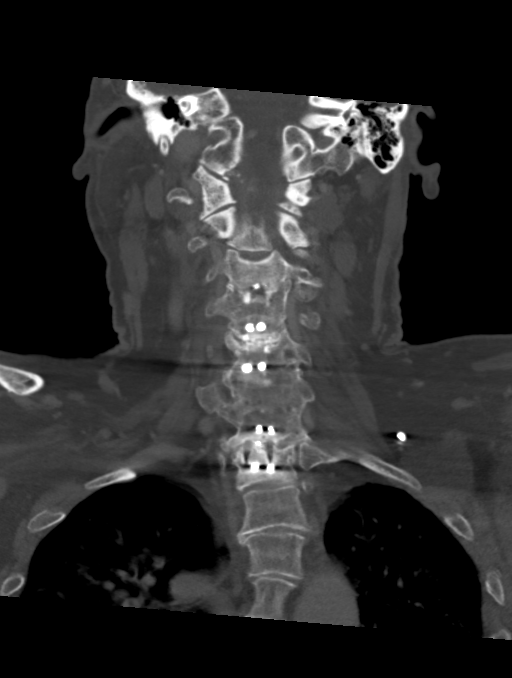

[Series 7: orthogonal ax · axial · 0.47mm/px · z∈[+57,+272]mm · 5 of 162 slices shown, 7 images]
[im 24/162  soft-tissue]
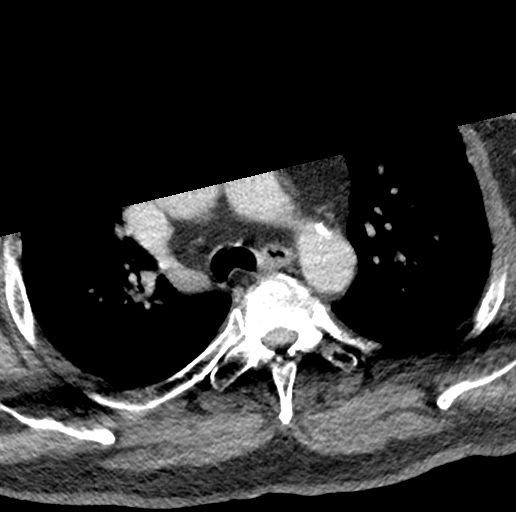
[im 24/162  bone]
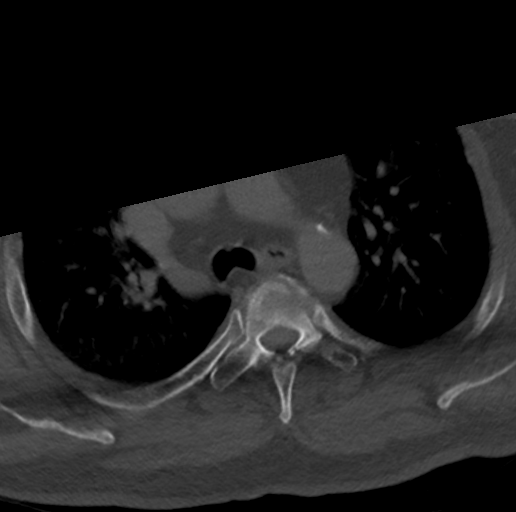
[im 47/162  bone]
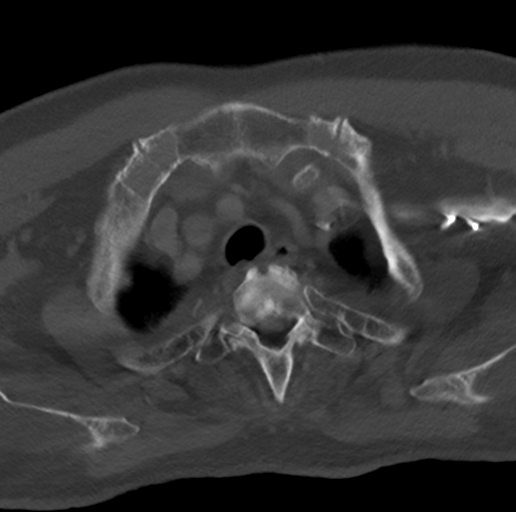
[im 93/162  bone]
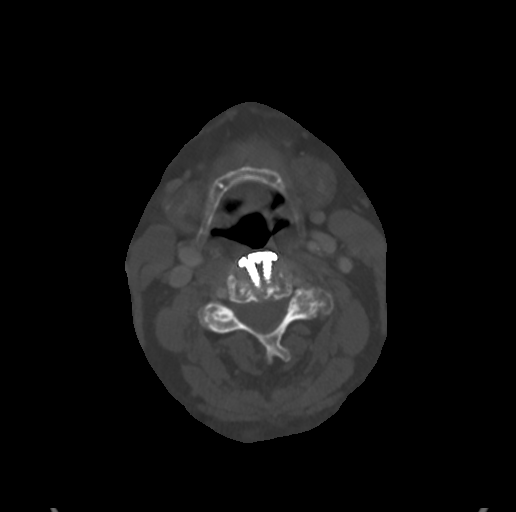
[im 116/162  bone]
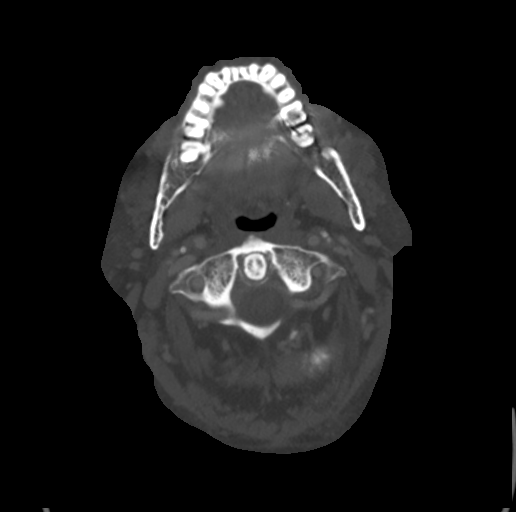
[im 139/162  soft-tissue]
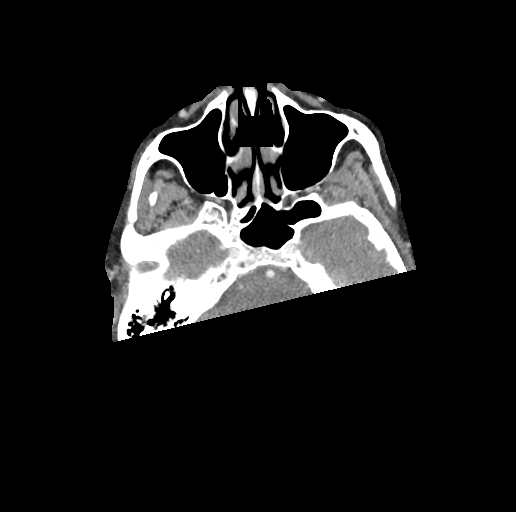
[im 139/162  bone]
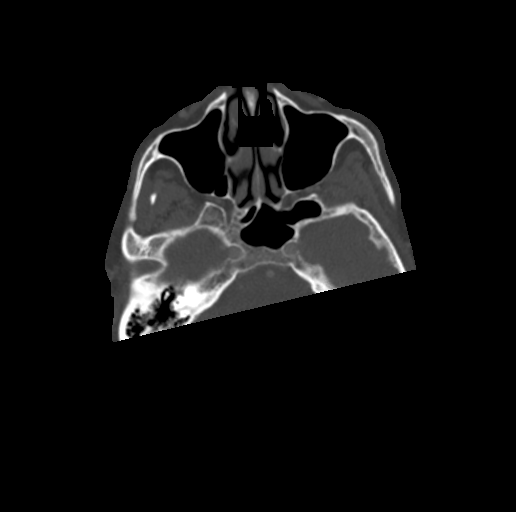

[13 of 34 positions shown; findings below may reference images not displayed]

FINDINGS: Pharynx and larynx: Normal. No mass or swelling.

Salivary glands: No inflammation, mass, or stone. Scarring is seen
at the skin surface along the posterior superficial left parotid. A
left parotid mass seen on comparison is no longer present.

Thyroid: Normal

Lymph nodes: None enlarged or abnormal density.

Vascular: Negative

Limited intracranial: Negative

Visualized orbits: Negative

Mastoids and visualized paranasal sinuses: Clear

Skeleton: C3-T1 ACDF with solid arthrodesis. Prominent right-sided
C2-3 foraminal stenosis due to facet and uncovertebral spurring. No
detected left-sided bony foraminal impingement or erosion. A
cervical MRI was obtained in 5704

Upper chest: Negative
IMPRESSION: 1. No visible inflammation or mass to correlate with symptoms.
2. Remote left parotid mass resection without adverse feature.

## 2022-08-02 DIAGNOSIS — G4733 Obstructive sleep apnea (adult) (pediatric): Secondary | ICD-10-CM | POA: Diagnosis not present

## 2022-08-08 DIAGNOSIS — M5412 Radiculopathy, cervical region: Secondary | ICD-10-CM | POA: Diagnosis not present

## 2022-08-08 DIAGNOSIS — Z6829 Body mass index (BMI) 29.0-29.9, adult: Secondary | ICD-10-CM | POA: Diagnosis not present

## 2022-08-10 ENCOUNTER — Encounter: Payer: Self-pay | Admitting: Neurology

## 2022-08-10 ENCOUNTER — Ambulatory Visit: Payer: PPO | Admitting: Diagnostic Neuroimaging

## 2022-08-10 ENCOUNTER — Encounter: Payer: Self-pay | Admitting: Diagnostic Neuroimaging

## 2022-08-10 VITALS — BP 135/84 | HR 59 | Ht 68.0 in | Wt 190.0 lb

## 2022-08-10 DIAGNOSIS — G250 Essential tremor: Secondary | ICD-10-CM

## 2022-08-10 MED ORDER — PRIMIDONE 50 MG PO TABS: 25.0000 mg | ORAL_TABLET | Freq: Two times a day (BID) | ORAL | 5 refills | Status: DC

## 2022-08-10 NOTE — Patient Instructions (Signed)
ESSENTIAL TREMOR - continue metoprolol 25mg  twice a day  - trial of primidone --> 25mg  daily; may increase up to 50mg  twice a day  - consider deep brain stimulator in future

## 2022-08-10 NOTE — Progress Notes (Signed)
[facility]  PATIENT: Benjamin Gutierrez DOB: [birth year]  REFERRING CLINICIAN: Julio Sicks, MD HISTORY FROM: patient REASON FOR VISIT: new consult   HISTORICAL  CHIEF COMPLAINT:  Chief Complaint  Patient presents with   New Patient (Initial Visit)    Pt with wife, rm 1. He has weakness and tremors in hands bilateral along with neck pain. He originally had numbness in the left arm which has gone away. He has had 3 surgeries in the neck area. He does have numbness present in right fingers but was told potentially pinch nerve in elbow. He has decreased grip and fine motor strength in bilateral hands. He describes some balance concerns. Completed MRI cervical spine with [facility]   Other    Pt is on metoprolol, Dr. Aline August (retired now), and was prescribed for tremors and it has helped until more recently its not as effective    HISTORY OF PRESENT ILLNESS:   72 year old male here for evaluation of tremor.  Symptoms started around age 54 years old with onset of action and postural tremor.  Symptoms worsened over time.  No family history of tremor.  Now having issues with holding cup, spoon, utensils, handwriting.  Symptoms fluctuate.  Can be worse when he is under stress or tension.  Patient also has had some chronic cervical spine issues and multiple surgeries going back to 2005.  He is left with some resultant C8 radiculopathy nerve damage, numbness, muscle atrophy and weakness in his hands.    REVIEW OF SYSTEMS: Full 14 system review of systems performed and negative with exception of: as per HPI.  ALLERGIES: Allergies  Allergen Reactions   Betadine [Povidone Iodine] Itching   Crestor [Rosuvastatin] Other (See Comments)    Muscle aches   Fentanyl Other (See Comments)    Pain patch and after passed out and admitted to the hospital   Penicillins Rash    HOME MEDICATIONS: Outpatient Medications Prior to Visit  Medication Sig Dispense Refill    acetaminophen (TYLENOL) 500 MG tablet Take 500 mg by mouth every 6 (six) hours as needed.     dexlansoprazole (DEXILANT) 60 MG capsule Take 1 capsule (60 mg total) by mouth daily. 90 capsule 1   metoprolol tartrate (LOPRESSOR) 25 MG tablet Take 1 tablet (25 mg total) by mouth 2 (two) times daily. 90 tablet 3   rosuvastatin (CRESTOR) 20 MG tablet Take 1 tablet (20 mg total) by mouth daily. 90 tablet 3   No facility-administered medications prior to visit.    PAST MEDICAL HISTORY: Past Medical History:  Diagnosis Date   Acid reflux 08/06/2014   Allergy    Anxiety 08/06/2014   Arthritis    Blood in feces 08/06/2014   Brachial neuritis 01/02/2008   Cancer (HCC)    forehead   Decreased libido 08/06/2014   Dizziness and giddiness 08/06/2014   ED (erectile dysfunction) of organic origin 08/06/2014   GERD (gastroesophageal reflux disease)    H/O arthrodesis 08/06/2014   Hypertension    Leg varices 08/06/2014    PAST SURGICAL HISTORY: Past Surgical History:  Procedure Laterality Date   CARDIAC CATHETERIZATION  1993   CERVICAL SPINE SURGERY  2009   radiculopathy from past fusion and arthritic changes   COLONOSCOPY  2013   COLONOSCOPY WITH PROPOFOL N/A 06/07/2020   Procedure: COLONOSCOPY WITH PROPOFOL;  Surgeon: Midge Minium, MD;  Location: [facility];  Service: Endoscopy;  Laterality: N/A;   ESOPHAGOGASTRODUODENOSCOPY ENDOSCOPY     EYE SURGERY  01/02/2018  right eye lid mole removed   LAMINECTOMY  2005   PAROTIDECTOMY Left 12/13/2016   Procedure: TOTAL PAROTIDECTOMY WITH FACIAL NERVE MONITORING;  Surgeon: Bud Face, MD;  Location: [facility] ORS;  Service: ENT;  Laterality: Left;   SPINE SURGERY  2005     2009    2016   Neck surgery    FAMILY HISTORY: Family History  Problem Relation Age of Onset   Esophageal cancer Mother    Arthritis Mother    Cancer Mother    Alcohol abuse Father    Prostate cancer Brother    Parkinson's disease Maternal Uncle    Dementia  Paternal Grandmother     SOCIAL HISTORY: Social History   Socioeconomic History   Marital status: Married    Spouse name: Darel Hong   Number of children: 2   Years of education: 12   Highest education level: High school graduate  Occupational History   Occupation: Retired  Tobacco Use   Smoking status: Never   Smokeless tobacco: Never   Tobacco comments:    I have never been a smoker.  Vaping Use   Vaping Use: Never used  Substance and Sexual Activity   Alcohol use: Never   Drug use: No   Sexual activity: Not Currently    Birth control/protection: None  Other Topics Concern   Not on file  Social History Narrative   Not on file   Social Determinants of Health   Financial Resource Strain: Low Risk  (05/25/2022)   Overall Financial Resource Strain (CARDIA)    Difficulty of Paying Living Expenses: Not hard at all  Food Insecurity: No Food Insecurity (05/25/2022)   Hunger Vital Sign    Worried About Running Out of Food in the Last Year: Never true    Ran Out of Food in the Last Year: Never true  Transportation Needs: No Transportation Needs (05/25/2022)   PRAPARE - Administrator, Civil Service (Medical): No    Lack of Transportation (Non-Medical): No  Physical Activity: Insufficiently Active (05/25/2022)   Exercise Vital Sign    Days of Exercise per Week: 3 days    Minutes of Exercise per Session: 30 min  Stress: No Stress Concern Present (05/25/2022)   [employer] of Occupational Health - Occupational Stress Questionnaire    Feeling of Stress : Not at all  Social Connections: Unknown (05/25/2022)   Social Connection and Isolation Panel [NHANES]    Frequency of Communication with Friends and Family: Three times a week    Frequency of Social Gatherings with Friends and Family: Once a week    Attends Religious Services: Not on Marketing executive or Organizations: Yes    Attends Banker Meetings: 1 to 4 times per year    Marital Status:  Married  Catering manager Violence: Not At Risk (05/29/2022)   Humiliation, Afraid, Rape, and Kick questionnaire    Fear of Current or Ex-Partner: No    Emotionally Abused: No    Physically Abused: No    Sexually Abused: No     PHYSICAL EXAM  GENERAL EXAM/CONSTITUTIONAL: Vitals:  Vitals:   08/10/22 0848  BP: 135/84  Pulse: (!) 59  Weight: 190 lb (86.2 kg)  Height: 5\' 8"  (1.727 m)   Body mass index is 28.89 kg/m. Wt Readings from Last 3 Encounters:  08/10/22 190 lb (86.2 kg)  06/13/22 188 lb 12.8 oz (85.6 kg)  06/12/22 187 lb 9.6 oz (85.1 kg)  Patient is in no distress; well developed, nourished and groomed; neck is supple  CARDIOVASCULAR: Examination of carotid arteries is normal; no carotid bruits Regular rate and rhythm, no murmurs Examination of peripheral vascular system by observation and palpation is normal  EYES: Ophthalmoscopic exam of optic discs and posterior segments is normal; no papilledema or hemorrhages No results found.  MUSCULOSKELETAL: Gait, strength, tone, movements noted in Neurologic exam below  NEUROLOGIC: MENTAL STATUS:      No data to display         awake, alert, oriented to person, place and time recent and remote memory intact normal attention and concentration language fluent, comprehension intact, naming intact fund of knowledge appropriate  CRANIAL NERVE:  2nd - no papilledema on fundoscopic exam 2nd, 3rd, 4th, 6th - pupils equal and reactive to light, visual fields full to confrontation, extraocular muscles intact, no nystagmus 5th - facial sensation symmetric 7th - facial strength symmetric 8th - hearing intact 9th - palate elevates symmetrically, uvula midline 11th - shoulder shrug symmetric 12th - tongue protrusion midline  MOTOR:  normal bulk and tone, full strength in the BUE, BLE; EXCEPT ATROPHY AND WEAKNESS OF BILATERAL INTRINSIC HAND MUSCLES POSTURAL AND ACTION TREMOR IN BUE (R > L) MILD VOICE  TREMOR  SENSORY:  normal and symmetric to light touch, temperature, vibration  COORDINATION:  finger-nose-finger, fine finger movements normal  REFLEXES:  deep tendon reflexes TRACE and symmetric  GAIT/STATION:  narrow based gait     DIAGNOSTIC DATA (LABS, IMAGING, TESTING) - I reviewed patient records, labs, notes, testing and imaging myself where available.  Lab Results  Component Value Date   WBC 7.9 06/12/2022   HGB 14.3 06/12/2022   HCT 42.4 06/12/2022   MCV 89 06/12/2022   PLT 170 06/12/2022      Component Value Date/Time   NA 143 06/12/2022 1528   K 4.4 06/12/2022 1528   CL 104 06/12/2022 1528   CO2 24 06/12/2022 1528   GLUCOSE 86 06/12/2022 1528   GLUCOSE 115 (H) 12/14/2016 1030   BUN 12 06/12/2022 1528   CREATININE 1.18 06/12/2022 1528   CALCIUM 9.5 06/12/2022 1528   PROT 6.7 06/12/2022 1528   ALBUMIN 4.2 06/12/2022 1528   AST 22 06/12/2022 1528   ALT 22 06/12/2022 1528   ALKPHOS 70 06/12/2022 1528   BILITOT 0.9 06/12/2022 1528   GFRNONAA 74 02/25/2020 1048   GFRAA 86 02/25/2020 1048   Lab Results  Component Value Date   CHOL 163 06/12/2022   HDL 48 06/12/2022   LDLCALC 96 06/12/2022   TRIG 101 06/12/2022   CHOLHDL 3.4 06/12/2022   Lab Results  Component Value Date   HGBA1C 5.5 02/28/2021   No results found for: "VITAMINB12" Lab Results  Component Value Date   TSH 2.320 02/25/2020    01/25/18 MRI cervical spine 1. At C7-T1 there is a broad-based disc bulge. Bilateral vertebral degenerative changes. Severe bilateral foraminal stenosis. 2. Anterior cervical fusion from C3 through C7. moderate-severe left foraminal stenosis at C3-4. 3. At C2-3 there is a mild broad-based disc bulge. Moderate right and mild left facet arthropathy. Mild right foraminal stenosis. No left foraminal stenosis.   ASSESSMENT AND PLAN  72 y.o. year old male here with ESSENTIAL TREMOR.   Dx:  1. Essential tremor     PLAN:  ESSENTIAL TREMOR - continue  metoprolol 25mg  twice a day  - trial of primidone --> 25mg  daily; may increase up to 50mg  twice a day  - consider  deep brain stimulator in future  Meds ordered this encounter  Medications   primidone (MYSOLINE) 50 MG tablet    Sig: Take 0.5-1 tablets (25-50 mg total) by mouth in the morning and at bedtime.    Dispense:  30 tablet    Refill:  5   Return for return to PCP, pending if symptoms worsen or fail to improve.    Suanne Marker, MD 08/10/2022, 9:51 AM Certified in Neurology, Neurophysiology and Neuroimaging  [facility] [address], [address] [place], [place] [address] [phone]

## 2022-08-16 DIAGNOSIS — G4733 Obstructive sleep apnea (adult) (pediatric): Secondary | ICD-10-CM | POA: Diagnosis not present

## 2022-09-01 DIAGNOSIS — G4733 Obstructive sleep apnea (adult) (pediatric): Secondary | ICD-10-CM | POA: Diagnosis not present

## 2022-10-02 DIAGNOSIS — G4733 Obstructive sleep apnea (adult) (pediatric): Secondary | ICD-10-CM | POA: Diagnosis not present

## 2022-11-02 DIAGNOSIS — G4733 Obstructive sleep apnea (adult) (pediatric): Secondary | ICD-10-CM | POA: Diagnosis not present

## 2022-11-08 DIAGNOSIS — H6063 Unspecified chronic otitis externa, bilateral: Secondary | ICD-10-CM | POA: Diagnosis not present

## 2022-11-08 DIAGNOSIS — H6983 Other specified disorders of Eustachian tube, bilateral: Secondary | ICD-10-CM | POA: Diagnosis not present

## 2022-11-08 DIAGNOSIS — J301 Allergic rhinitis due to pollen: Secondary | ICD-10-CM | POA: Diagnosis not present

## 2022-11-15 DIAGNOSIS — G4733 Obstructive sleep apnea (adult) (pediatric): Secondary | ICD-10-CM | POA: Diagnosis not present

## 2022-11-23 ENCOUNTER — Ambulatory Visit (INDEPENDENT_AMBULATORY_CARE_PROVIDER_SITE_OTHER): Payer: PPO | Admitting: Family Medicine

## 2022-11-23 VITALS — BP 114/77 | HR 102 | Ht 68.0 in | Wt 187.9 lb

## 2022-11-23 DIAGNOSIS — I2510 Atherosclerotic heart disease of native coronary artery without angina pectoris: Secondary | ICD-10-CM

## 2022-11-23 DIAGNOSIS — G720 Drug-induced myopathy: Secondary | ICD-10-CM | POA: Diagnosis not present

## 2022-11-23 DIAGNOSIS — R0989 Other specified symptoms and signs involving the circulatory and respiratory systems: Secondary | ICD-10-CM

## 2022-11-23 DIAGNOSIS — J310 Chronic rhinitis: Secondary | ICD-10-CM | POA: Diagnosis not present

## 2022-11-23 DIAGNOSIS — J42 Unspecified chronic bronchitis: Secondary | ICD-10-CM

## 2022-11-23 DIAGNOSIS — J209 Acute bronchitis, unspecified: Secondary | ICD-10-CM

## 2022-11-23 DIAGNOSIS — B9689 Other specified bacterial agents as the cause of diseases classified elsewhere: Secondary | ICD-10-CM

## 2022-11-23 LAB — POC COVID19 BINAXNOW: SARS Coronavirus 2 Ag: NEGATIVE

## 2022-11-23 MED ORDER — AZITHROMYCIN 500 MG PO TABS
500.0000 mg | ORAL_TABLET | Freq: Every day | ORAL | 0 refills | Status: DC
Start: 2022-11-23 — End: 2023-03-27

## 2022-11-23 MED ORDER — EZETIMIBE 10 MG PO TABS
10.0000 mg | ORAL_TABLET | Freq: Every day | ORAL | 0 refills | Status: DC
Start: 2022-11-23 — End: 2024-01-07

## 2022-11-23 MED ORDER — DOXYCYCLINE HYCLATE 100 MG PO TABS
100.0000 mg | ORAL_TABLET | Freq: Two times a day (BID) | ORAL | 0 refills | Status: DC
Start: 2022-11-23 — End: 2023-03-27

## 2022-11-23 MED ORDER — MONTELUKAST SODIUM 10 MG PO TABS
10.0000 mg | ORAL_TABLET | Freq: Every day | ORAL | 3 refills | Status: DC
Start: 2022-11-23 — End: 2023-07-12

## 2022-11-23 MED ORDER — PREDNISONE 20 MG PO TABS
20.0000 mg | ORAL_TABLET | Freq: Every day | ORAL | 0 refills | Status: DC
Start: 2022-11-24 — End: 2023-03-27

## 2022-11-23 NOTE — Progress Notes (Unsigned)
Established patient visit   Patient: Benjamin Gutierrez   DOB: [birth year]   72 y.o. Male  MRN: [id number] Visit Date: 11/23/2022  Today's healthcare provider: Jacky Kindle, FNP  Introduced to nurse practitioner role and practice setting.  All questions answered.  Discussed provider/patient relationship and expectations.  Subjective    HPI HPI     Medical Management of Chronic Issues    Additional comments: Symptoms started around 9/19. Headaches, no fever, congestion in the nose, possible cold, previous treatment has been zyrtec and mucinex, has had a waterlike substance come out the nose for months       Last edited by Rolly Salter, CMA on 11/23/2022  4:22 PM.      Medications: Outpatient Medications Prior to Visit  Medication Sig   acetaminophen (TYLENOL) 500 MG tablet Take 500 mg by mouth every 6 (six) hours as needed.   Azelastine HCl 137 MCG/SPRAY SOLN Place 1 spray into the nose in the morning and at bedtime.   dexlansoprazole (DEXILANT) 60 MG capsule Take 1 capsule (60 mg total) by mouth daily.   fluticasone (FLONASE) 50 MCG/ACT nasal spray Place 2 sprays into both nostrils daily.   metoprolol tartrate (LOPRESSOR) 25 MG tablet Take 1 tablet (25 mg total) by mouth 2 (two) times daily.   primidone (MYSOLINE) 50 MG tablet Take 0.5-1 tablets (25-50 mg total) by mouth in the morning and at bedtime. (Patient not taking: Reported on 11/23/2022)   No facility-administered medications prior to visit.     Objective    BP 114/77 (BP Location: Left Arm, Patient Position: Sitting, Cuff Size: Large)   Pulse (!) 102   Ht 5\' 8"  (1.727 m)   Wt 187 lb 14.4 oz (85.2 kg)   SpO2 94%   BMI 28.57 kg/m   Physical Exam Vitals and nursing note reviewed.  Constitutional:      Appearance: Normal appearance. He is overweight.  HENT:     Head: Normocephalic and atraumatic.     Nose: Congestion and rhinorrhea present.     Mouth/Throat:     Mouth: Mucous membranes are moist.      Pharynx: Oropharynx is clear. No oropharyngeal exudate or posterior oropharyngeal erythema.  Eyes:     Conjunctiva/sclera: Conjunctivae normal.  Cardiovascular:     Rate and Rhythm: Normal rate and regular rhythm.     Pulses: Normal pulses.     Heart sounds: Normal heart sounds.  Pulmonary:     Effort: Pulmonary effort is normal.     Breath sounds: Rhonchi present.  Musculoskeletal:        General: Normal range of motion.     Cervical back: Normal range of motion.  Skin:    General: Skin is warm and dry.     Capillary Refill: Capillary refill takes less than 2 seconds.  Neurological:     General: No focal deficit present.     Mental Status: He is alert and oriented to person, place, and time. Mental status is at baseline.  Psychiatric:        Mood and Affect: Mood normal.        Behavior: Behavior normal.        Thought Content: Thought content normal.        Judgment: Judgment normal.     No results found for any visits on 11/23/22.  Assessment & Plan     Problem List Items Addressed This Visit       Cardiovascular and Mediastinum  Arteriosclerotic cardiovascular disease (ASCVD)    Current risk of ASCVD event is 20%; pt noted significant statin myopathy with crestor trail. Improved since stopping medication. Recommend addition of Zetia 10 mg at this time to assist; f/u as needed. Appears Repatha is not covered under current Rx formulary; recommend patient to check.      Relevant Medications   ezetimibe (ZETIA) 10 MG tablet     Respiratory   Chronic bronchitis with acute exacerbation (HCC) - Primary    Acute on chronic, symptoms worsening since 9/19 Failed conservative and allergic treatment; already seen by ENT Recommend ABX and steroids; recommend addition of singulair to assist with current nasal sprays      Relevant Medications   montelukast (SINGULAIR) 10 MG tablet   doxycycline (VIBRA-TABS) 100 MG tablet   azithromycin (ZITHROMAX) 500 MG tablet   predniSONE  (DELTASONE) 20 MG tablet   Azelastine HCl 137 MCG/SPRAY SOLN   fluticasone (FLONASE) 50 MCG/ACT nasal spray   Chronic rhinitis   Relevant Medications   montelukast (SINGULAIR) 10 MG tablet   Azelastine HCl 137 MCG/SPRAY SOLN   fluticasone (FLONASE) 50 MCG/ACT nasal spray     Musculoskeletal and Integument   Statin myopathy   Relevant Medications   ezetimibe (ZETIA) 10 MG tablet     Other   Upper respiratory symptom   Relevant Orders   POC COVID-19   Return if symptoms worsen or fail to improve.     Leilani Merl, FNP, have reviewed all documentation for this visit. The documentation on 11/24/22 for the exam, diagnosis, procedures, and orders are all accurate and complete.  Jacky Kindle, FNP  [facility] [phone] (phone) [phone] (fax)  [facility]

## 2022-11-24 ENCOUNTER — Encounter: Payer: Self-pay | Admitting: Family Medicine

## 2022-11-24 DIAGNOSIS — J209 Acute bronchitis, unspecified: Secondary | ICD-10-CM | POA: Insufficient documentation

## 2022-11-24 DIAGNOSIS — G720 Drug-induced myopathy: Secondary | ICD-10-CM | POA: Insufficient documentation

## 2022-11-24 DIAGNOSIS — I2510 Atherosclerotic heart disease of native coronary artery without angina pectoris: Secondary | ICD-10-CM | POA: Insufficient documentation

## 2022-11-24 DIAGNOSIS — J310 Chronic rhinitis: Secondary | ICD-10-CM | POA: Insufficient documentation

## 2022-11-24 DIAGNOSIS — R0989 Other specified symptoms and signs involving the circulatory and respiratory systems: Secondary | ICD-10-CM | POA: Insufficient documentation

## 2022-11-24 NOTE — Assessment & Plan Note (Signed)
Acute on chronic, symptoms worsening since 9/19 Failed conservative and allergic treatment; already seen by ENT Recommend ABX and steroids; recommend addition of singulair to assist with current nasal sprays

## 2022-11-24 NOTE — Assessment & Plan Note (Signed)
Current risk of ASCVD event is 20%; pt noted significant statin myopathy with crestor trail. Improved since stopping medication. Recommend addition of Zetia 10 mg at this time to assist; f/u as needed. Appears Repatha is not covered under current Rx formulary; recommend patient to check.

## 2022-12-02 DIAGNOSIS — G4733 Obstructive sleep apnea (adult) (pediatric): Secondary | ICD-10-CM | POA: Diagnosis not present

## 2022-12-03 ENCOUNTER — Other Ambulatory Visit: Payer: PPO

## 2022-12-13 ENCOUNTER — Other Ambulatory Visit: Payer: Self-pay | Admitting: Family Medicine

## 2022-12-13 DIAGNOSIS — R12 Heartburn: Secondary | ICD-10-CM

## 2022-12-21 DIAGNOSIS — H43813 Vitreous degeneration, bilateral: Secondary | ICD-10-CM | POA: Diagnosis not present

## 2022-12-21 DIAGNOSIS — H2513 Age-related nuclear cataract, bilateral: Secondary | ICD-10-CM | POA: Diagnosis not present

## 2022-12-21 DIAGNOSIS — H04123 Dry eye syndrome of bilateral lacrimal glands: Secondary | ICD-10-CM | POA: Diagnosis not present

## 2023-01-02 DIAGNOSIS — G4733 Obstructive sleep apnea (adult) (pediatric): Secondary | ICD-10-CM | POA: Diagnosis not present

## 2023-01-23 ENCOUNTER — Other Ambulatory Visit: Payer: Self-pay

## 2023-01-23 DIAGNOSIS — R12 Heartburn: Secondary | ICD-10-CM

## 2023-01-23 MED ORDER — DEXLANSOPRAZOLE 60 MG PO CPDR: 60.0000 mg | DELAYED_RELEASE_CAPSULE | Freq: Every day | ORAL | 1 refills | Status: DC

## 2023-02-01 DIAGNOSIS — G4733 Obstructive sleep apnea (adult) (pediatric): Secondary | ICD-10-CM | POA: Diagnosis not present

## 2023-02-05 ENCOUNTER — Encounter: Payer: Self-pay | Admitting: Urology

## 2023-02-18 DIAGNOSIS — G4733 Obstructive sleep apnea (adult) (pediatric): Secondary | ICD-10-CM | POA: Diagnosis not present

## 2023-02-26 ENCOUNTER — Telehealth: Payer: Self-pay

## 2023-02-26 NOTE — Telephone Encounter (Signed)
 Copied from CRM [id number]. Topic: Appointment Scheduling - Scheduling Inquiry for Clinic >> Feb 26, 2023  1:46 PM Turkey B wrote: Reason for CRM: pt needs AWV rescheduled. Please cb to see when he should be scheduled.

## 2023-02-27 NOTE — Telephone Encounter (Signed)
 Called patient to confirm he needed to rescheduled AWV visit and he ended up keeping it for the same day and same time.

## 2023-03-04 DIAGNOSIS — G4733 Obstructive sleep apnea (adult) (pediatric): Secondary | ICD-10-CM | POA: Diagnosis not present

## 2023-03-25 ENCOUNTER — Encounter: Payer: Self-pay | Admitting: Physician Assistant

## 2023-03-25 DIAGNOSIS — K2100 Gastro-esophageal reflux disease with esophagitis, without bleeding: Secondary | ICD-10-CM

## 2023-03-25 DIAGNOSIS — R251 Tremor, unspecified: Secondary | ICD-10-CM

## 2023-03-25 DIAGNOSIS — R000 Tachycardia, unspecified: Secondary | ICD-10-CM

## 2023-03-25 NOTE — Telephone Encounter (Signed)
Please see message requesting alternative to Dexilant due to cost/deductible. Thank you!

## 2023-03-26 MED ORDER — LANSOPRAZOLE 30 MG PO CPDR
30.0000 mg | DELAYED_RELEASE_CAPSULE | Freq: Every day | ORAL | 3 refills | Status: DC
Start: 2023-03-26 — End: 2023-07-17

## 2023-03-27 ENCOUNTER — Ambulatory Visit: Payer: PPO | Admitting: Pulmonary Disease

## 2023-03-27 ENCOUNTER — Encounter: Payer: Self-pay | Admitting: Pulmonary Disease

## 2023-03-27 VITALS — BP 132/80 | HR 61 | Temp 97.8°F | Ht 68.0 in | Wt 193.6 lb

## 2023-03-27 DIAGNOSIS — G4733 Obstructive sleep apnea (adult) (pediatric): Secondary | ICD-10-CM | POA: Diagnosis not present

## 2023-03-27 NOTE — Patient Instructions (Addendum)
 Follow-up a year from now  The download from the machine shows that the CPAP is working well, continue using it nightly  Call us  with significant concerns

## 2023-03-27 NOTE — Progress Notes (Signed)
 Benjamin Gutierrez    [id number]    08-Dec-1950  Primary Care Physician:Ostwalt, Janna, PA-C  Referring Physician: Emilio Kelly DASEN, FNP No address on file  Chief complaint:   Nonrestorative sleep, multiple awakenings Found to have mild sleep apnea  HPI:  Patient with mild sleep apnea, uses CPAP nightly  Not having any significant issues with CPAP  It took him a little bit of time to adjust to the new pressure settings however, he is doing well with no significant concerns  At some point did have some nasal congestion for which he uses Flonase , has been working well for him  He does feel rested when he wakes up in the morning  Denies any significant issues with his CPAP at present  Usually goes to bed between 10 and 11 Falls asleep in about 30 minutes 2-3 awakenings Final wake up time between 9 and 10 AM  No significant snoring with machine on No night sweats  Remembers that his parents snored  He does have some sleepiness during the day States that his wife tells him that anytime he sits not doing anything will fall asleep  Does not drink too late into the evening  Never smoker   Outpatient Encounter Medications as of 03/27/2023  Medication Sig   acetaminophen  (TYLENOL ) 500 MG tablet Take 500 mg by mouth every 6 (six) hours as needed.   Azelastine HCl 137 MCG/SPRAY SOLN Place 1 spray into the nose in the morning and at bedtime.   dexlansoprazole  (DEXILANT ) 60 MG capsule Take 1 capsule (60 mg total) by mouth daily.   ezetimibe  (ZETIA ) 10 MG tablet Take 1 tablet (10 mg total) by mouth daily.   fluticasone  (FLONASE ) 50 MCG/ACT nasal spray Place 2 sprays into both nostrils daily.   metoprolol  tartrate (LOPRESSOR ) 25 MG tablet Take 1 tablet (25 mg total) by mouth 2 (two) times daily.   montelukast  (SINGULAIR ) 10 MG tablet Take 1 tablet (10 mg total) by mouth at bedtime.   primidone  (MYSOLINE ) 50 MG tablet Take 0.5-1 tablets (25-50 mg total) by mouth in the  morning and at bedtime.   lansoprazole  (PREVACID ) 30 MG capsule Take 1 capsule (30 mg total) by mouth daily at 12 noon. (Patient not taking: Reported on 03/27/2023)   [DISCONTINUED] azithromycin  (ZITHROMAX ) 500 MG tablet Take 1 tablet (500 mg total) by mouth daily. (Patient not taking: Reported on 03/27/2023)   [DISCONTINUED] doxycycline  (VIBRA -TABS) 100 MG tablet Take 1 tablet (100 mg total) by mouth 2 (two) times daily. (Patient not taking: Reported on 03/27/2023)   [DISCONTINUED] predniSONE  (DELTASONE ) 20 MG tablet Take 1 tablet (20 mg total) by mouth daily with breakfast. (Patient not taking: Reported on 03/27/2023)   No facility-administered encounter medications on file as of 03/27/2023.    Allergies as of 03/27/2023 - Review Complete 03/27/2023  Allergen Reaction Noted   Betadine [povidone iodine] Itching 08/06/2014   Crestor  [rosuvastatin ] Other (See Comments) 08/10/2022   Fentanyl  Other (See Comments) 11/15/2016   Penicillins Rash 08/06/2014    Past Medical History:  Diagnosis Date   Acid reflux 08/06/2014   Allergy    Anxiety 08/06/2014   Arthritis    Blood in feces 08/06/2014   Brachial neuritis 01/02/2008   Cancer (HCC)    forehead   Decreased libido 08/06/2014   Dizziness and giddiness 08/06/2014   ED (erectile dysfunction) of organic origin 08/06/2014   GERD (gastroesophageal reflux disease)    H/O arthrodesis 08/06/2014  Hypertension    Leg varices 08/06/2014    Past Surgical History:  Procedure Laterality Date   CARDIAC CATHETERIZATION  1993   CERVICAL SPINE SURGERY  2009   radiculopathy from past fusion and arthritic changes   COLONOSCOPY  2013   COLONOSCOPY WITH PROPOFOL  N/A 06/07/2020   Procedure: COLONOSCOPY WITH PROPOFOL ;  Surgeon: Jinny Carmine, MD;  Location: [facility];  Service: Endoscopy;  Laterality: N/A;   ESOPHAGOGASTRODUODENOSCOPY ENDOSCOPY     EYE SURGERY  01/02/2018   right eye lid mole removed   LAMINECTOMY  2005   PAROTIDECTOMY Left  12/13/2016   Procedure: TOTAL PAROTIDECTOMY WITH FACIAL NERVE MONITORING;  Surgeon: Milissa Hamming, MD;  Location: [facility] ORS;  Service: ENT;  Laterality: Left;   SPINE SURGERY  2005     2009    2016   Neck surgery    Family History  Problem Relation Age of Onset   Esophageal cancer Mother    Arthritis Mother    Cancer Mother    Alcohol abuse Father    Prostate cancer Brother    Parkinson's disease Maternal Uncle    Dementia Paternal Grandmother     Social History   Socioeconomic History   Marital status: Married    Spouse name: Dagoberto   Number of children: 2   Years of education: 12   Highest education level: 12th grade  Occupational History   Occupation: Retired  Tobacco Use   Smoking status: Never   Smokeless tobacco: Never   Tobacco comments:    I have never been a smoker.  Vaping Use   Vaping status: Never Used  Substance and Sexual Activity   Alcohol use: Never   Drug use: No   Sexual activity: Not Currently    Birth control/protection: None  Other Topics Concern   Not on file  Social History Narrative   Not on file   Social Drivers of Health   Financial Resource Strain: Low Risk  (11/23/2022)   Overall Financial Resource Strain (CARDIA)    Difficulty of Paying Living Expenses: Not hard at all  Food Insecurity: No Food Insecurity (05/25/2022)   Hunger Vital Sign    Worried About Running Out of Food in the Last Year: Never true    Ran Out of Food in the Last Year: Never true  Transportation Needs: No Transportation Needs (11/23/2022)   PRAPARE - Administrator, Civil Service (Medical): No    Lack of Transportation (Non-Medical): No  Physical Activity: Insufficiently Active (11/23/2022)   Exercise Vital Sign    Days of Exercise per Week: 3 days    Minutes of Exercise per Session: 30 min  Stress: No Stress Concern Present (11/23/2022)   [employer] of Occupational Health - Occupational Stress Questionnaire    Feeling of Stress : Not at all   Social Connections: Socially Integrated (11/23/2022)   Social Connection and Isolation Panel [NHANES]    Frequency of Communication with Friends and Family: More than three times a week    Frequency of Social Gatherings with Friends and Family: Twice a week    Attends Religious Services: More than 4 times per year    Active Member of [employer] or Organizations: No    Attends Banker Meetings: 1 to 4 times per year    Marital Status: Married  Catering Manager Violence: Not At Risk (05/29/2022)   Humiliation, Afraid, Rape, and Kick questionnaire    Fear of Current or Ex-Partner: No    Emotionally  Abused: No    Physically Abused: No    Sexually Abused: No    Review of Systems  Respiratory:  Positive for apnea.   Psychiatric/Behavioral:  Positive for sleep disturbance.     Vitals:   03/27/23 1404  BP: 132/80  Pulse: 61  Temp: 97.8 F (36.6 C)  SpO2: 94%     Physical Exam Constitutional:      Appearance: Normal appearance.  HENT:     Head: Normocephalic.     Mouth/Throat:     Mouth: Mucous membranes are moist.     Comments: Mallampati 3, crowded oropharynx Cardiovascular:     Rate and Rhythm: Normal rate and regular rhythm.     Heart sounds: No murmur heard.    No friction rub.  Pulmonary:     Effort: No respiratory distress.     Breath sounds: No stridor. No wheezing or rhonchi.  Musculoskeletal:     Cervical back: No rigidity or tenderness.  Neurological:     Mental Status: He is alert.  Psychiatric:        Mood and Affect: Mood normal.       12/22/2021    2:00 PM  Results of the Epworth flowsheet  Sitting and reading 1  Watching TV 1  Sitting, inactive in a public place (e.g. a theatre or a meeting) 0  As a passenger in a car for an hour without a break 1  Lying down to rest in the afternoon when circumstances permit 1  Sitting and talking to someone 0  Sitting quietly after a lunch without alcohol 0  In a car, while stopped for a few minutes in  traffic 0  Total score 4     Data Reviewed: Sleep study reviewed showing mild obstructive sleep apnea  Compliance data reviewed showing excellent compliance with nightly use 100% compliance, average use of 7 hours 44 minutes AutoSet 10-18 95 percentile pressure 15.8 AHI of 3.7  Assessment:  Mild obstructive sleep apnea -Continues to tolerate CPAP well Not having any significant issues  Between the last time he was here and presently, did have some nasal stuffiness congestion for which he uses Flonase  -Continue to use Flonase  nightly  Sleep is restorative, tolerating CPAP well   Plan/Recommendations:  I have continue CPAP  No changes need made at present  Since you are doing well, we will see you back a year from now  Call us  with significant concerns  Jennet Epley MD [facility] 03/27/2023, 2:08 PM  CC: Emilio Kelly DASEN, FNP

## 2023-04-01 MED ORDER — METOPROLOL TARTRATE 25 MG PO TABS: 25.0000 mg | ORAL_TABLET | Freq: Two times a day (BID) | ORAL | 3 refills | Status: DC

## 2023-04-01 NOTE — Addendum Note (Signed)
 Addended by: Janice Seales E on: 04/01/2023 02:42 PM   Modules accepted: Orders

## 2023-04-04 DIAGNOSIS — G4733 Obstructive sleep apnea (adult) (pediatric): Secondary | ICD-10-CM | POA: Diagnosis not present

## 2023-04-15 DIAGNOSIS — D2262 Melanocytic nevi of left upper limb, including shoulder: Secondary | ICD-10-CM | POA: Diagnosis not present

## 2023-04-15 DIAGNOSIS — D2261 Melanocytic nevi of right upper limb, including shoulder: Secondary | ICD-10-CM | POA: Diagnosis not present

## 2023-04-15 DIAGNOSIS — L570 Actinic keratosis: Secondary | ICD-10-CM | POA: Diagnosis not present

## 2023-04-15 DIAGNOSIS — Z85828 Personal history of other malignant neoplasm of skin: Secondary | ICD-10-CM | POA: Diagnosis not present

## 2023-04-15 DIAGNOSIS — D2272 Melanocytic nevi of left lower limb, including hip: Secondary | ICD-10-CM | POA: Diagnosis not present

## 2023-04-15 DIAGNOSIS — D225 Melanocytic nevi of trunk: Secondary | ICD-10-CM | POA: Diagnosis not present

## 2023-05-02 DIAGNOSIS — G4733 Obstructive sleep apnea (adult) (pediatric): Secondary | ICD-10-CM | POA: Diagnosis not present

## 2023-05-20 DIAGNOSIS — G4733 Obstructive sleep apnea (adult) (pediatric): Secondary | ICD-10-CM | POA: Diagnosis not present

## 2023-06-06 ENCOUNTER — Other Ambulatory Visit: Payer: Self-pay

## 2023-06-06 DIAGNOSIS — Z125 Encounter for screening for malignant neoplasm of prostate: Secondary | ICD-10-CM

## 2023-06-07 ENCOUNTER — Other Ambulatory Visit: Payer: PPO

## 2023-06-07 LAB — PSA: Prostate Specific Ag, Serum: 1.2 ng/mL (ref 0.0–4.0)

## 2023-06-12 ENCOUNTER — Ambulatory Visit: Payer: PPO | Admitting: Urology

## 2023-06-13 ENCOUNTER — Encounter: Payer: Self-pay | Admitting: Physician Assistant

## 2023-06-13 ENCOUNTER — Ambulatory Visit: Payer: Self-pay | Admitting: Urology

## 2023-06-13 ENCOUNTER — Encounter: Payer: PPO | Admitting: Family Medicine

## 2023-06-20 ENCOUNTER — Encounter: Admitting: Physician Assistant

## 2023-06-25 ENCOUNTER — Ambulatory Visit: Admitting: Urology

## 2023-06-25 VITALS — BP 162/90 | HR 75 | Ht 68.0 in | Wt 193.8 lb

## 2023-06-25 DIAGNOSIS — Z125 Encounter for screening for malignant neoplasm of prostate: Secondary | ICD-10-CM

## 2023-06-25 DIAGNOSIS — R399 Unspecified symptoms and signs involving the genitourinary system: Secondary | ICD-10-CM

## 2023-06-25 NOTE — Patient Instructions (Addendum)
 Please stop by the lab a week prior to your next appointment   Nocturia refers to the need to wake up during the night to urinate, which can disrupt your sleep and impact your overall well-being. Fortunately, there are several strategies you can employ to help prevent or manage nocturia. It's important to consult with your healthcare provider before making any significant changes to your routine. Here are some helpful strategies to consider:  Limit Fluid Intake Before Bed: Avoid drinking large amounts of fluids in the evening, especially within a few hours of bedtime. Consume most of your daily fluid intake earlier in the day to reduce the need to urinate at night.  Monitor Your Diet: Limit your intake of caffeine and alcohol, as these substances can increase urine production and irritate the bladder.  Avoid diet, "zero calorie," and artificially sweetened drinks, especially sodas, in the afternoon or evening. Be mindful of consuming foods and drinks with high water content before bedtime, such as watermelon and herbal teas.  Time Your Medications: If you're taking medications that contribute to increased urination, consult your healthcare provider about adjusting the timing of these medications to minimize their impact during the night.  Practice Double Voiding: Before going to bed, make an effort to empty your bladder twice within a short period. This can help reduce the amount of urine left in your bladder before sleep.  Bladder Training: Gradually increase the time between bathroom visits during the day to train your bladder to hold larger volumes of urine. Over time, this can help reduce the frequency of nighttime awakenings to urinate.  Elevate Your Legs During the Day: Elevating your legs during the day can help minimize fluid retention in your lower extremities, which might reduce nighttime urination.  Pelvic Floor Exercises: Strengthening your pelvic floor muscles through Kegel  exercises can help improve bladder control and potentially reduce the urge to urinate at night.  Create a Relaxing Bedtime Routine: Stress and anxiety can exacerbate nocturia. Engage in calming activities before bed, such as reading, listening to soothing music, or practicing relaxation techniques.  Stay Active: Engage in regular physical activity, but avoid intense exercise close to bedtime, as this can increase your body's demand for fluids.  Maintain a Healthy Weight: Excess weight can compress the bladder and contribute to bladder and urinary issues. Aim to achieve and maintain a healthy weight through a balanced diet and regular exercise.  Remember that every individual is unique, and the effectiveness of these strategies may vary. It's important to work with your healthcare provider to develop a plan that suits your specific needs and addresses any underlying causes of nocturia.

## 2023-06-25 NOTE — Progress Notes (Signed)
   06/25/2023 2:17 PM   Benjamin Gutierrez [birth year] [id number]  Reason for visit: Follow up PSA screening, nocturia, BPH  HPI: 73 year old healthy male with family history of prostate cancer in his brother who has opted to continue PSA screening.  Most recent PSA 1.2 stable from 1.0 last year.  Reassurance was provided, and can continue screening yearly per patient preference.  Nocturia improved after starting CPAP, still having nocturia 1-2x per night, behavioral strategies were discussed.  Not having significant urinary problems during the day, previously trialed Flomax  with severe lightheadedness/dizziness.  RTC 1 year PSA prior, PVR  Lawerence Pressman, MD  [facility] [address] [place], [place] [address] [phone]

## 2023-07-01 ENCOUNTER — Ambulatory Visit (INDEPENDENT_AMBULATORY_CARE_PROVIDER_SITE_OTHER): Admitting: Physician Assistant

## 2023-07-01 ENCOUNTER — Encounter: Payer: Self-pay | Admitting: Physician Assistant

## 2023-07-01 VITALS — BP 132/85 | HR 64 | Resp 16 | Ht 68.0 in | Wt 193.4 lb

## 2023-07-01 DIAGNOSIS — Z0001 Encounter for general adult medical examination with abnormal findings: Secondary | ICD-10-CM | POA: Diagnosis not present

## 2023-07-01 DIAGNOSIS — E663 Overweight: Secondary | ICD-10-CM | POA: Diagnosis not present

## 2023-07-01 DIAGNOSIS — Z0000 Encounter for general adult medical examination without abnormal findings: Secondary | ICD-10-CM

## 2023-07-01 DIAGNOSIS — R5383 Other fatigue: Secondary | ICD-10-CM | POA: Diagnosis not present

## 2023-07-01 NOTE — Progress Notes (Signed)
 Complete physical exam  Patient: Benjamin Gutierrez   DOB: 01-18-1951   73 y.o. Male  MRN: [id number] Visit Date: 07/01/2023  Today's healthcare provider: Blane Bunting, PA-C   Chief Complaint  Patient presents with   Annual Exam    CPE/ medications concerns  wants blood work and pt complains of leg cramps    Subjective    Benjamin Gutierrez is a 73 y.o. male who presents today for a complete physical exam.   Discussed the use of AI scribe software for clinical note transcription with the patient, who gave verbal consent to proceed.  History of Present Illness Benjamin CRAMER "[name]" is a 73 year old male who presents for medication management and blood pressure evaluation.  He switched from Dexilant  to a different medication due to cost and currently takes metoprolol  for hand tremors, which is effective. He has not been on specific blood pressure medication but owns a home blood pressure monitor, which he used frequently in the past. He has not measured his blood pressure recently and reports no dizziness, double vision, or chest pain.  He has hyperlipidemia and was prescribed Zetia  after experiencing side effects from statins but has not been taking it due to similar symptoms. He recalls being prescribed multiple medications after a blood test last year but is unaware of the specific drugs.  He uses Flonase  for seasonal allergies, which is effective, and a CPAP machine. He experiences nasal dripping attributed to allergies and was prescribed azelastine nasal spray, which is less effective than Flonase .    Last depression screening scores    07/01/2023   10:45 AM 06/12/2022    3:39 PM 05/29/2022    2:17 PM  PHQ 2/9 Scores  PHQ - 2 Score 0 0 0  PHQ- 9 Score 0 0    Last fall risk screening    03/27/2023    2:02 PM  Fall Risk   Falls in the past year? 0   Last Audit-C alcohol use screening    06/12/2022    3:39 PM  Alcohol Use Disorder Test (AUDIT)  1. How often do you  have a drink containing alcohol? 0   A score of 3 or more in women, and 4 or more in men indicates increased risk for alcohol abuse, EXCEPT if all of the points are from question 1   Past Medical History:  Diagnosis Date   Acid reflux 08/06/2014   Allergy    Anxiety 08/06/2014   Arthritis    Blood in feces 08/06/2014   Brachial neuritis 01/02/2008   Cancer (HCC)    forehead   Decreased libido 08/06/2014   Dizziness and giddiness 08/06/2014   ED (erectile dysfunction) of organic origin 08/06/2014   GERD (gastroesophageal reflux disease)    H/O arthrodesis 08/06/2014   Hypertension    Leg varices 08/06/2014   Past Surgical History:  Procedure Laterality Date   CARDIAC CATHETERIZATION  1993   CERVICAL SPINE SURGERY  2009   radiculopathy from past fusion and arthritic changes   COLONOSCOPY  2013   COLONOSCOPY WITH PROPOFOL  N/A 06/07/2020   Procedure: COLONOSCOPY WITH PROPOFOL ;  Surgeon: Marnee Sink, MD;  Location: [facility] ENDOSCOPY;  Service: Endoscopy;  Laterality: N/A;   ESOPHAGOGASTRODUODENOSCOPY ENDOSCOPY     EYE SURGERY  01/02/2018   right eye lid mole removed   LAMINECTOMY  2005   PAROTIDECTOMY Left 12/13/2016   Procedure: TOTAL PAROTIDECTOMY WITH FACIAL NERVE MONITORING;  Surgeon: Rogers Clayman, MD;  Location: [facility] ORS;  Service: ENT;  Laterality: Left;   SPINE SURGERY  2005     2009    2016   Neck surgery   Social History   Socioeconomic History   Marital status: Married    Spouse name: Marily Shows   Number of children: 2   Years of education: 12   Highest education level: 12th grade  Occupational History   Occupation: Retired  Tobacco Use   Smoking status: Never   Smokeless tobacco: Never   Tobacco comments:    I have never been a smoker.  Vaping Use   Vaping status: Never Used  Substance and Sexual Activity   Alcohol use: Never   Drug use: No   Sexual activity: Not Currently    Birth control/protection: None  Other Topics Concern   Not on file  Social  History Narrative   Not on file   Social Drivers of Health   Financial Resource Strain: Low Risk  (11/23/2022)   Overall Financial Resource Strain (CARDIA)    Difficulty of Paying Living Expenses: Not hard at all  Food Insecurity: No Food Insecurity (05/25/2022)   Hunger Vital Sign    Worried About Running Out of Food in the Last Year: Never true    Ran Out of Food in the Last Year: Never true  Transportation Needs: No Transportation Needs (11/23/2022)   PRAPARE - Administrator, Civil Service (Medical): No    Lack of Transportation (Non-Medical): No  Physical Activity: Insufficiently Active (11/23/2022)   Exercise Vital Sign    Days of Exercise per Week: 3 days    Minutes of Exercise per Session: 30 min  Stress: No Stress Concern Present (11/23/2022)   [employer] of Occupational Health - Occupational Stress Questionnaire    Feeling of Stress : Not at all  Social Connections: Moderately Integrated (11/23/2022)   Social Connection and Isolation Panel [NHANES]    Frequency of Communication with Friends and Family: More than three times a week    Frequency of Social Gatherings with Friends and Family: Twice a week    Attends Religious Services: More than 4 times per year    Active Member of [employer] or Organizations: No    Attends Engineer, structural: Not on file    Marital Status: Married  Catering manager Violence: Not At Risk (05/29/2022)   Humiliation, Afraid, Rape, and Kick questionnaire    Fear of Current or Ex-Partner: No    Emotionally Abused: No    Physically Abused: No    Sexually Abused: No   Family Status  Relation Name Status   Mother Nathanyal Mcmahill Deceased   Father  Deceased   Brother  Alive   Daughter  Alive   Son  Alive   Mat Uncle  Deceased   PGM  Deceased  No partnership data on file   Family History  Problem Relation Age of Onset   Esophageal cancer Mother    Arthritis Mother    Cancer Mother    Alcohol abuse Father    Prostate  cancer Brother    Parkinson's disease Maternal Uncle    Dementia Paternal Grandmother    Allergies  Allergen Reactions   Betadine [Povidone Iodine] Itching   Crestor  [Rosuvastatin ] Other (See Comments)    Muscle aches   Fentanyl  Other (See Comments)    Pain patch and after passed out and admitted to the hospital   Penicillins Rash    Patient Care Team: Geronimo Diliberto,  PA-C as PCP - General (Physician Assistant) Zacarias Hermann, MD as Referring Physician (Ophthalmology) Lawerence Pressman, MD as Consulting Physician (Urology) Annell Kidney, MD as Referring Physician (Ophthalmology) Agustina Aldrich, MD as Consulting Physician (Neurosurgery) Dasher, Margette Sheldon, MD (Dermatology)   Medications: Outpatient Medications Prior to Visit  Medication Sig   acetaminophen  (TYLENOL ) 500 MG tablet Take 500 mg by mouth every 6 (six) hours as needed.   ezetimibe  (ZETIA ) 10 MG tablet Take 1 tablet (10 mg total) by mouth daily.   fluticasone  (FLONASE ) 50 MCG/ACT nasal spray Place 2 sprays into both nostrils daily.   metoprolol  tartrate (LOPRESSOR ) 25 MG tablet Take 1 tablet (25 mg total) by mouth 2 (two) times daily.   montelukast  (SINGULAIR ) 10 MG tablet Take 1 tablet (10 mg total) by mouth at bedtime.   primidone  (MYSOLINE ) 50 MG tablet Take 0.5-1 tablets (25-50 mg total) by mouth in the morning and at bedtime.   UNABLE TO FIND Apply twice a day to temples for 4-5 days   lansoprazole  (PREVACID ) 30 MG capsule Take 1 capsule (30 mg total) by mouth daily at 12 noon. (Patient not taking: Reported on 07/01/2023)   No facility-administered medications prior to visit.    Review of Systems  All other systems reviewed and are negative.  Except see HPI     Objective    Pulse 64   Resp 16   Ht 5\' 8"  (1.727 m)   Wt 193 lb 6.4 oz (87.7 kg)   SpO2 98%   BMI 29.41 kg/m      Physical Exam Vitals reviewed.  Constitutional:      General: He is not in acute distress.    Appearance: Normal  appearance. He is well-developed. He is not ill-appearing, toxic-appearing or diaphoretic.  HENT:     Head: Normocephalic and atraumatic.     Right Ear: Tympanic membrane, ear canal and external ear normal.     Left Ear: Tympanic membrane, ear canal and external ear normal.     Nose: Nose normal. No congestion or rhinorrhea.     Mouth/Throat:     Mouth: Mucous membranes are moist.     Pharynx: Oropharynx is clear. No oropharyngeal exudate.  Eyes:     General: No scleral icterus.       Right eye: No discharge.        Left eye: No discharge.     Conjunctiva/sclera: Conjunctivae normal.     Pupils: Pupils are equal, round, and reactive to light.  Neck:     Thyroid : No thyromegaly.     Vascular: No carotid bruit.  Cardiovascular:     Rate and Rhythm: Normal rate and regular rhythm.     Pulses: Normal pulses.     Heart sounds: Normal heart sounds. No murmur heard.    No friction rub. No gallop.  Pulmonary:     Effort: Pulmonary effort is normal. No respiratory distress.     Breath sounds: Normal breath sounds. No wheezing or rales.  Abdominal:     General: Abdomen is flat. Bowel sounds are normal. There is no distension.     Palpations: Abdomen is soft. There is no mass.     Tenderness: There is no abdominal tenderness. There is no right CVA tenderness, left CVA tenderness, guarding or rebound.     Hernia: No hernia is present.  Musculoskeletal:        General: No swelling, tenderness, deformity or signs of injury. Normal range of motion.  Cervical back: Normal range of motion and neck supple. No rigidity or tenderness.     Right lower leg: No edema.     Left lower leg: No edema.  Lymphadenopathy:     Cervical: No cervical adenopathy.  Skin:    General: Skin is warm and dry.     Coloration: Skin is not jaundiced or pale.     Findings: No bruising, erythema, lesion or rash.  Neurological:     Mental Status: He is alert and oriented to person, place, and time. Mental status is  at baseline.     Gait: Gait normal.  Psychiatric:        Mood and Affect: Mood normal.        Behavior: Behavior normal.        Thought Content: Thought content normal.        Judgment: Judgment normal.      No results found for any visits on 07/01/23.  Assessment & Plan    Routine Health Maintenance and Physical Exam  Exercise Activities and Dietary recommendations  Goals      DIET - EAT MORE FRUITS AND VEGETABLES     DIET - INCREASE WATER INTAKE     Recommend to drink at least 6-8 8oz glasses of water per day.        Immunization History  Administered Date(s) Administered   Fluad Quad(high Dose 65+) 12/11/2021   H1N1 01/02/2008   Influenza, High Dose Seasonal PF 11/14/2017, 10/21/2019, 11/30/2020   Influenza-Unspecified 11/22/2015, 12/04/2016, 11/30/2020   Moderna Sars-Covid-2 Vaccination 04/03/2019, 05/02/2019, 01/07/2020   PFIZER Comirnaty(Gray Top)Covid-19 Tri-Sucrose Vaccine 03/20/2021   Pneumococcal Conjugate-13 05/28/2014   Pneumococcal Polysaccharide-23 11/22/2015   Td 11/22/2004   Tdap 10/10/2010   Zoster Recombinant(Shingrix) 09/12/2017, 01/14/2018   Zoster, Live 09/01/2010    Health Maintenance  Topic Date Due   DTaP/Tdap/Td vaccine (3 - Td or Tdap) 10/09/2020   COVID-19 Vaccine (5 - 2024-25 season) 10/21/2022   Medicare Annual Wellness Visit  05/29/2023   Flu Shot  09/20/2023   Colon Cancer Screening  06/08/2027   Pneumonia Vaccine  Completed   Hepatitis C Screening  Completed   Zoster (Shingles) Vaccine  Completed   HPV Vaccine  Aged Out   Meningitis B Vaccine  Aged Out    Discussed health benefits of physical activity, and encouraged him to engage in regular exercise appropriate for his age and condition.  Assessment & Plan Hypertension Chronic and unstable Elevated blood pressure. Metoprolol  considered but may be insufficient. Advised dietary sodium reduction. - Monitor blood pressure at home twice daily, recording systolic, diastolic, and  heart rate. - Reduce dietary sodium intake. - Reassess blood pressure in two weeks.  Hyperlipidemia Chronic Intolerance to statins and Zetia . Discussed potential injectable therapy if oral medications are not tolerated. - Order blood work to assess lipid profile. - Consider injectable lipid-lowering therapy if oral medications are not tolerated. Will follow-up  Tremor Essential Hand tremors managed with metoprolol . No changes in medication as it is the safest option. Will follow-up  Seasonal Allergies Post-nasal drainage managed with Flonase . Advised saline spray before Flonase  for improved efficacy. - Use saline spray before Flonase  application.  Overweight (BMI 25.0-29.9) (Primary) - CBC with Differential/Platelet - Comprehensive metabolic panel with GFR - Lipid panel - Hemoglobin A1c - TSH  Other fatigue - CBC with Differential/Platelet - Comprehensive metabolic panel with GFR - Lipid panel - Hemoglobin A1c - TSH  Annual physical exam UTD on dental/eye Things to do to keep  yourself healthy  - Exercise at least 30-45 minutes a day, 3-4 days a week.  - Eat a low-fat diet with lots of fruits and vegetables, up to 7-9 servings per day.  - Seatbelts can save your life. Wear them always.  - Smoke detectors on every level of your home, check batteries every year.  - Eye Doctor - have an eye exam every 1-2 years  - Safe sex - if you may be exposed to STDs, use a condom.  - Alcohol -  If you drink, do it moderately, less than 2 drinks per day.  - Health Care Power of Attorney. Choose someone to speak for you if you are not able.  - Depression is common in our stressful world.If you're feeling down or losing interest in things you normally enjoy, please come in for a visit.  - Violence - If anyone is threatening or hurting you, please call immediately.  Wellness Visit Annual wellness visit was due in April but rescheduled. - Schedule annual wellness visit.   No  follow-ups on file.    The patient was advised to call back or seek an in-person evaluation if the symptoms worsen or if the condition fails to improve as anticipated.  I discussed the assessment and treatment plan with the patient. The patient was provided an opportunity to ask questions and all were answered. The patient agreed with the plan and demonstrated an understanding of the instructions.  I, Rodrecus Belsky, PA-C have reviewed all documentation for this visit. The documentation on 07/01/2023  for the exam, diagnosis, procedures, and orders are all accurate and complete.  Blane Bunting, [facility], MMS [facility] [phone] (phone) [phone] (fax)  [facility]

## 2023-07-02 LAB — COMPREHENSIVE METABOLIC PANEL WITH GFR
ALT: 27 IU/L (ref 0–44)
AST: 29 IU/L (ref 0–40)
Albumin: 4.2 g/dL (ref 3.8–4.8)
Alkaline Phosphatase: 58 IU/L (ref 44–121)
BUN/Creatinine Ratio: 16 (ref 10–24)
BUN: 16 mg/dL (ref 8–27)
Bilirubin Total: 0.9 mg/dL (ref 0.0–1.2)
CO2: 26 mmol/L (ref 20–29)
Calcium: 9.3 mg/dL (ref 8.6–10.2)
Chloride: 105 mmol/L (ref 96–106)
Creatinine, Ser: 1.01 mg/dL (ref 0.76–1.27)
Globulin, Total: 1.9 g/dL (ref 1.5–4.5)
Glucose: 88 mg/dL (ref 70–99)
Potassium: 4.9 mmol/L (ref 3.5–5.2)
Sodium: 143 mmol/L (ref 134–144)
Total Protein: 6.1 g/dL (ref 6.0–8.5)
eGFR: 78 mL/min/{1.73_m2} (ref >59)

## 2023-07-02 LAB — CBC WITH DIFFERENTIAL/PLATELET
Basophils Absolute: 0.1 10*3/uL (ref 0.0–0.2)
Basos: 1 %
EOS (ABSOLUTE): 0.1 10*3/uL (ref 0.0–0.4)
Eos: 1 %
Hematocrit: 42.5 % (ref 37.5–51.0)
Hemoglobin: 14 g/dL (ref 13.0–17.7)
Immature Grans (Abs): 0 10*3/uL (ref 0.0–0.1)
Immature Granulocytes: 0 %
Lymphocytes Absolute: 2.1 10*3/uL (ref 0.7–3.1)
Lymphs: 33 %
MCH: 30.3 pg (ref 26.6–33.0)
MCHC: 32.9 g/dL (ref 31.5–35.7)
MCV: 92 fL (ref 79–97)
Monocytes Absolute: 0.6 10*3/uL (ref 0.1–0.9)
Monocytes: 9 %
Neutrophils Absolute: 3.4 10*3/uL (ref 1.4–7.0)
Neutrophils: 56 %
Platelets: 164 10*3/uL (ref 150–450)
RBC: 4.62 x10E6/uL (ref 4.14–5.80)
RDW: 12.9 % (ref 11.6–15.4)
WBC: 6.2 10*3/uL (ref 3.4–10.8)

## 2023-07-02 LAB — LIPID PANEL
Chol/HDL Ratio: 2.8 ratio (ref 0.0–5.0)
Cholesterol, Total: 136 mg/dL (ref 100–199)
HDL: 49 mg/dL (ref >39)
LDL Chol Calc (NIH): 73 mg/dL (ref 0–99)
Triglycerides: 72 mg/dL (ref 0–149)
VLDL Cholesterol Cal: 14 mg/dL (ref 5–40)

## 2023-07-02 LAB — HEMOGLOBIN A1C
Est. average glucose Bld gHb Est-mCnc: 117 mg/dL
Hgb A1c MFr Bld: 5.7 % — ABNORMAL HIGH (ref 4.8–5.6)

## 2023-07-02 LAB — TSH: TSH: 1.61 u[IU]/mL (ref 0.450–4.500)

## 2023-07-03 ENCOUNTER — Ambulatory Visit: Payer: Self-pay | Admitting: Physician Assistant

## 2023-07-03 ENCOUNTER — Telehealth: Payer: Self-pay

## 2023-07-03 NOTE — Telephone Encounter (Signed)
 Benjamin Gutierrez

## 2023-07-03 NOTE — Telephone Encounter (Deleted)
 Benjamin Gutierrez

## 2023-07-12 ENCOUNTER — Telehealth: Payer: Self-pay | Admitting: Physician Assistant

## 2023-07-12 DIAGNOSIS — J310 Chronic rhinitis: Secondary | ICD-10-CM

## 2023-07-12 MED ORDER — MONTELUKAST SODIUM 10 MG PO TABS: 10.0000 mg | ORAL_TABLET | Freq: Every day | ORAL | 1 refills | Status: DC

## 2023-07-12 NOTE — Telephone Encounter (Signed)
Medication request has been sent to the pharmacy 

## 2023-07-12 NOTE — Telephone Encounter (Signed)
 [facility] faxed refill request for the following medications:   montelukast  (SINGULAIR ) 10 MG tablet    90 day supply   Please advise.

## 2023-07-16 ENCOUNTER — Ambulatory Visit: Admitting: Physician Assistant

## 2023-07-16 VITALS — BP 136/76 | HR 74 | Resp 16 | Ht 68.0 in | Wt 193.0 lb

## 2023-07-16 DIAGNOSIS — I10 Essential (primary) hypertension: Secondary | ICD-10-CM | POA: Diagnosis not present

## 2023-07-16 DIAGNOSIS — R7303 Prediabetes: Secondary | ICD-10-CM

## 2023-07-16 DIAGNOSIS — G720 Drug-induced myopathy: Secondary | ICD-10-CM

## 2023-07-16 DIAGNOSIS — E663 Overweight: Secondary | ICD-10-CM | POA: Diagnosis not present

## 2023-07-16 DIAGNOSIS — K2100 Gastro-esophageal reflux disease with esophagitis, without bleeding: Secondary | ICD-10-CM

## 2023-07-16 MED ORDER — HYDROCHLOROTHIAZIDE 12.5 MG PO CAPS: 12.5000 mg | ORAL_CAPSULE | Freq: Every day | ORAL | 1 refills | Status: DC

## 2023-07-16 MED ORDER — VALSARTAN 40 MG PO TABS: 40.0000 mg | ORAL_TABLET | Freq: Every day | ORAL | 3 refills | Status: AC

## 2023-07-16 NOTE — Progress Notes (Signed)
 Established patient visit  Patient: Benjamin Gutierrez   DOB: 03/23/1950   73 y.o. Male  MRN: [id number] Visit Date: 07/16/2023  Today's healthcare provider: Blane Bunting, PA-C   Chief Complaint  Patient presents with   Hypertension   Subjective       Discussed the use of AI scribe software for clinical note transcription with the patient, who gave verbal consent to proceed.  History of Present Illness Benjamin Gutierrez "[name]" is a 73 year old male with hypertension who presents for blood pressure management.  He has been monitoring his blood pressure for two weeks. Initially high, his readings decreased after increasing metoprolol  to two tablets daily. Previously, one tablet was sufficient for his tremors. Since the dose adjustment, blood pressure readings have gradually reduced.  He uses a pulse oximeter to monitor oxygen levels, which are lower than expected. He questions the device's accuracy, as it was purchased online for his wife. He plans to compare readings with his wife's to verify accuracy.  He previously stopped cholesterol medication due to severe cramps, which resolved after discontinuation. He is open to resuming cholesterol medication but remains cautious due to past side effects.  Recent blood work shows a slightly elevated hemoglobin A1c, indicating a prediabetic range. His wife prepares meals with many vegetables, which he enjoys.  He has a history of exposure to [employer] and carbon monoxide at work, affecting his breathing. Retired for over 20 years, he no longer experiences these exposures. No current shortness of breath, visual problems, or swelling. He underwent heart catheterization 30 years ago, showing a 20% blockage. He recalls a cardiologist mentioning a slight electrical issue with his heart but was not advised to worry. No episodes of rapid heart beating.       07/01/2023   10:45 AM 06/12/2022    3:39 PM 05/29/2022    2:17 PM  Depression screen PHQ 2/9   Decreased Interest 0 0 0  Down, Depressed, Hopeless 0 0 0  PHQ - 2 Score 0 0 0  Altered sleeping 0 0   Tired, decreased energy 0 0   Change in appetite 0 0   Feeling bad or failure about yourself  0 0   Trouble concentrating 0 0   Moving slowly or fidgety/restless 0 0   Suicidal thoughts 0 0   PHQ-9 Score 0 0   Difficult doing work/chores Not difficult at all Not difficult at all       07/01/2023   10:45 AM  GAD 7 : Generalized Anxiety Score  Nervous, Anxious, on Edge 0  Control/stop worrying 0  Worry too much - different things 0  Trouble relaxing 0  Restless 0  Afraid - awful might happen 0  Anxiety Difficulty Not difficult at all    Medications: Outpatient Medications Prior to Visit  Medication Sig   acetaminophen  (TYLENOL ) 500 MG tablet Take 500 mg by mouth every 6 (six) hours as needed.   ezetimibe  (ZETIA ) 10 MG tablet Take 1 tablet (10 mg total) by mouth daily.   fluticasone  (FLONASE ) 50 MCG/ACT nasal spray Place 2 sprays into both nostrils daily.   lansoprazole  (PREVACID ) 30 MG capsule Take 1 capsule (30 mg total) by mouth daily at 12 noon. (Patient not taking: Reported on 07/01/2023)   metoprolol  tartrate (LOPRESSOR ) 25 MG tablet Take 1 tablet (25 mg total) by mouth 2 (two) times daily.   montelukast  (SINGULAIR ) 10 MG tablet Take 1 tablet (10 mg total) by mouth at bedtime.   primidone  (  MYSOLINE ) 50 MG tablet Take 0.5-1 tablets (25-50 mg total) by mouth in the morning and at bedtime.   UNABLE TO FIND Apply twice a day to temples for 4-5 days   No facility-administered medications prior to visit.    Review of Systems All negative Except see HPI       Objective    BP 136/76 (BP Location: Right Arm, Patient Position: Sitting)   Pulse 74   Resp 16   Ht 5\' 8"  (1.727 m)   Wt 193 lb (87.5 kg)   SpO2 95%   BMI 29.35 kg/m     Physical Exam Vitals reviewed.  Constitutional:      General: He is not in acute distress.    Appearance: Normal appearance. He is  not diaphoretic.  HENT:     Head: Normocephalic and atraumatic.  Eyes:     General: No scleral icterus.    Conjunctiva/sclera: Conjunctivae normal.  Cardiovascular:     Rate and Rhythm: Normal rate and regular rhythm.     Pulses: Normal pulses.     Heart sounds: Normal heart sounds. No murmur heard. Pulmonary:     Effort: Pulmonary effort is normal. No respiratory distress.     Breath sounds: Normal breath sounds. No wheezing or rhonchi.  Musculoskeletal:     Cervical back: Neck supple.     Right lower leg: No edema.     Left lower leg: No edema.  Lymphadenopathy:     Cervical: No cervical adenopathy.  Skin:    General: Skin is warm and dry.     Findings: No rash.  Neurological:     Mental Status: He is alert and oriented to person, place, and time. Mental status is at baseline.  Psychiatric:        Mood and Affect: Mood normal.        Behavior: Behavior normal.      No results found for any visits on 07/16/23.      Assessment & Plan Hypertension Chronic Blood pressure improved with increased metoprolol . Considering additional medication due to persistent elevation. Discussed hydrochlorothiazide  or valsartan. Informed consent obtained regarding diuretic effects and increased water intake. - Continue metoprolol  as prescribed. - Initiate new antihypertensive medication at low dose, instruct to cut tablet in half, see below. - Monitor blood pressure twice daily and document readings. - Ensure blood pressure remains between 90/60 mmHg and 120/80 mmHg. - Increase water intake due to potential diuretic effect. - Contact provider if experiencing adverse effects or unusual symptoms. - Follow up in two weeks for reassessment. GFR 78, last cmp wnl  Prediabetes Chronic Hemoglobin A1c in prediabetic range. Last A1c 5.7 Advised dietary modifications to manage blood glucose levels. - Adopt a low carbohydrate diet. - Refer to American Diabetes Association website for dietary  guidance. - Continue monitoring blood glucose levels. Will follow-up  Primary hypertension (Primary)  - valsartan (DIOVAN) 40 MG tablet; Take 1 tablet (40 mg total) by mouth daily.  Dispense: 90 tablet; Refill: 3  Gastroesophageal reflux disease with esophagitis without hemorrhage Stop taking prevacid  30mg  on 07/01/23 but med was last refilled on 05/18/23 for 3 month. Needs to recheck if pt is taking this medication! Elevate the head of the bed 6-8 inches, avoid recumbency for 3 hours after eating, avoid food as a delayed gastric emptying, weight loss   Will follow-up  Overweight (BMI 25.0-29.9) Chronic and stable Body mass index is 29.35 kg/m. Weight loss of 5% of pt's current weight via healthy diet  and daily exercise encouraged. Lab results were reviewed.  Statin myopathy Pt is not taking any cholesterol medication  His LP stays wnl including ldl 73 from 07/01/23 Continue low cholesterol diet and daily exercise Lp reviewed Will follow-up   No orders of the defined types were placed in this encounter.   Return in about 2 weeks (around 07/30/2023).   The patient was advised to call back or seek an in-person evaluation if the symptoms worsen or if the condition fails to improve as anticipated.  I discussed the assessment and treatment plan with the patient. The patient was provided an opportunity to ask questions and all were answered. The patient agreed with the plan and demonstrated an understanding of the instructions.  I, Kymani Laursen, PA-C have reviewed all documentation for this visit. The documentation on 07/16/2023  for the exam, diagnosis, procedures, and orders are all accurate and complete.  Blane Bunting, [facility], MMS [facility] [phone] (phone) [phone] (fax)  [facility]

## 2023-07-17 ENCOUNTER — Encounter: Payer: Self-pay | Admitting: Physician Assistant

## 2023-07-17 DIAGNOSIS — R7303 Prediabetes: Secondary | ICD-10-CM | POA: Insufficient documentation

## 2023-07-30 ENCOUNTER — Encounter: Payer: Self-pay | Admitting: Physician Assistant

## 2023-07-30 ENCOUNTER — Ambulatory Visit: Admitting: Physician Assistant

## 2023-07-30 VITALS — BP 100/58 | HR 64 | Resp 16

## 2023-07-30 DIAGNOSIS — R7303 Prediabetes: Secondary | ICD-10-CM | POA: Diagnosis not present

## 2023-07-30 DIAGNOSIS — G720 Drug-induced myopathy: Secondary | ICD-10-CM

## 2023-07-30 DIAGNOSIS — I10 Essential (primary) hypertension: Secondary | ICD-10-CM | POA: Diagnosis not present

## 2023-07-30 DIAGNOSIS — T148XXA Other injury of unspecified body region, initial encounter: Secondary | ICD-10-CM | POA: Diagnosis not present

## 2023-07-30 DIAGNOSIS — E782 Mixed hyperlipidemia: Secondary | ICD-10-CM | POA: Diagnosis not present

## 2023-07-30 DIAGNOSIS — T466X5A Adverse effect of antihyperlipidemic and antiarteriosclerotic drugs, initial encounter: Secondary | ICD-10-CM | POA: Diagnosis not present

## 2023-07-30 HISTORY — DX: Mixed hyperlipidemia: E78.2

## 2023-07-30 NOTE — Progress Notes (Signed)
 Established patient visit  Patient: Benjamin Gutierrez   DOB: 1950/10/31   73 y.o. Male  MRN: [id number] Visit Date: 07/30/2023  Today's healthcare provider: Blane Bunting, PA-C   Chief Complaint  Patient presents with   Follow-up    HTN F/u no other com   Subjective     HPI     Follow-up    Additional comments: HTN F/u no other com      Last edited by Estill Hemming, CMA on 07/30/2023  1:13 PM.       Discussed the use of AI scribe software for clinical note transcription with the patient, who gave verbal consent to proceed.  History of Present Illness Benjamin Gutierrez "Benjamin Gutierrez" is a 73 year old male with hypertension and prediabetes who presents for medication management and follow-up.  He takes metoprolol  twice daily, valsartan  40 mg, and hydrochlorothiazide  12.5 mg. He switched from Dexilant  to Prevacid  due to cost. He manages prediabetes with diet alone. He has experienced severe cramps and myopathy with statins and is not on cholesterol-lowering medication.  He experiences numbness in his hands, particularly in one finger, and reduced grip strength. He previously used gabapentin and found physical therapy beneficial.  He has a slight issue with urination, described as slow, with improvement noted when using a diuretic, as he is not waking up frequently at night.  He denies double vision, blurry vision, dizziness, headache, chest pain, shortness of breath, or palpitations.       07/01/2023   10:45 AM 06/12/2022    3:39 PM 05/29/2022    2:17 PM  Depression screen PHQ 2/9  Decreased Interest 0 0 0  Down, Depressed, Hopeless 0 0 0  PHQ - 2 Score 0 0 0  Altered sleeping 0 0   Tired, decreased energy 0 0   Change in appetite 0 0   Feeling bad or failure about yourself  0 0   Trouble concentrating 0 0   Moving slowly or fidgety/restless 0 0   Suicidal thoughts 0 0   PHQ-9 Score 0 0   Difficult doing work/chores Not difficult at all Not difficult at all        07/01/2023   10:45 AM  GAD 7 : Generalized Anxiety Score  Nervous, Anxious, on Edge 0  Control/stop worrying 0  Worry too much - different things 0  Trouble relaxing 0  Restless 0  Afraid - awful might happen 0  Anxiety Difficulty Not difficult at all    Medications: Outpatient Medications Prior to Visit  Medication Sig   acetaminophen  (TYLENOL ) 500 MG tablet Take 500 mg by mouth every 6 (six) hours as needed.   ezetimibe  (ZETIA ) 10 MG tablet Take 1 tablet (10 mg total) by mouth daily.   fluticasone  (FLONASE ) 50 MCG/ACT nasal spray Place 2 sprays into both nostrils daily.   hydrochlorothiazide  (MICROZIDE ) 12.5 MG capsule Take 12.5 mg by mouth daily.   metoprolol  tartrate (LOPRESSOR ) 25 MG tablet Take 1 tablet (25 mg total) by mouth 2 (two) times daily.   montelukast  (SINGULAIR ) 10 MG tablet Take 1 tablet (10 mg total) by mouth at bedtime.   primidone  (MYSOLINE ) 50 MG tablet Take 0.5-1 tablets (25-50 mg total) by mouth in the morning and at bedtime.   UNABLE TO FIND Apply twice a day to temples for 4-5 days   valsartan  (DIOVAN ) 40 MG tablet Take 1 tablet (40 mg total) by mouth daily.   No facility-administered medications prior to visit.  Review of Systems All negative Except see HPI       Objective    BP (!) 100/58 (BP Location: Right Arm, Patient Position: Sitting)   Pulse 64   Resp 16   SpO2 97%     Physical Exam Vitals reviewed.  Constitutional:      General: He is not in acute distress.    Appearance: Normal appearance. He is not diaphoretic.  HENT:     Head: Normocephalic and atraumatic.  Eyes:     General: No scleral icterus.    Conjunctiva/sclera: Conjunctivae normal.  Cardiovascular:     Rate and Rhythm: Normal rate and regular rhythm.     Pulses: Normal pulses.     Heart sounds: Normal heart sounds. No murmur heard. Pulmonary:     Effort: Pulmonary effort is normal. No respiratory distress.     Breath sounds: Normal breath sounds. No wheezing or  rhonchi.  Musculoskeletal:     Cervical back: Neck supple.     Right lower leg: No edema.     Left lower leg: No edema.  Lymphadenopathy:     Cervical: No cervical adenopathy.  Skin:    General: Skin is warm and dry.     Findings: No rash.  Neurological:     Mental Status: He is alert and oriented to person, place, and time. Mental status is at baseline.  Psychiatric:        Mood and Affect: Mood normal.        Behavior: Behavior normal.      No results found for any visits on 07/30/23.      Assessment and Plan Assessment & Plan Nerve compression in hand Numbness and weakness likely due to ulnar nerve compression. Gabapentin not tolerated. Physical therapy beneficial. - Consider physical therapy. - Consider neurology referral if symptoms worsen.  Hypertension Chronic Hypertension controlled with metoprolol , valsartan , and hydrochlorothiazide . Blood pressure <120/80 mmHg. - Continue metoprolol , valsartan , and hydrochlorothiazide . Bring all his medication to the next appointment to reassure compliance - Monitor blood pressure regularly. - Report symptoms of hypotension. - Prescribe 90 tablets for each medication.  Prediabetes Prediabetes managed with dietary modifications. Emphasized reducing bread and adding salads and fruits. - Continue dietary modifications and exercise. - Monitor blood glucose levels. - Reduce bread intake. - Incorporate salads and fruits with each meal. Will follow-up  Hyperlipidemia Chronic Previous myopathy with statins. Zetia  not taken. Concerns about rhabdomyolysis with rosuvastatin . - Discontinue Zetia . - Monitor cholesterol levels at next visit.   No orders of the defined types were placed in this encounter.   No follow-ups on file.   The patient was advised to call back or seek an in-person evaluation if the symptoms worsen or if the condition fails to improve as anticipated.  I discussed the assessment and treatment plan with the  patient. The patient was provided an opportunity to ask questions and all were answered. The patient agreed with the plan and demonstrated an understanding of the instructions.  I, Chestina Komatsu, PA-C have reviewed all documentation for this visit. The documentation on 07/30/2023  for the exam, diagnosis, procedures, and orders are all accurate and complete.  Blane Bunting, [facility], MMS [facility] [phone] (phone) [phone] (fax)  [facility]

## 2023-08-20 DIAGNOSIS — G4733 Obstructive sleep apnea (adult) (pediatric): Secondary | ICD-10-CM | POA: Diagnosis not present

## 2023-09-05 ENCOUNTER — Other Ambulatory Visit: Payer: Self-pay | Admitting: Physician Assistant

## 2023-09-17 ENCOUNTER — Ambulatory Visit (INDEPENDENT_AMBULATORY_CARE_PROVIDER_SITE_OTHER): Payer: PPO

## 2023-09-17 VITALS — BP 120/70 | Ht 68.0 in | Wt 193.2 lb

## 2023-09-17 DIAGNOSIS — Z0000 Encounter for general adult medical examination without abnormal findings: Secondary | ICD-10-CM

## 2023-09-17 NOTE — Progress Notes (Signed)
 Subjective:   Benjamin Gutierrez is a 73 y.o. who presents for a Medicare Wellness preventive visit.  As a reminder, Annual Wellness Visits don't include a physical exam, and some assessments may be limited, especially if this visit is performed virtually. We may recommend an in-person follow-up visit with your provider if needed.  Visit Complete: In person  Persons Participating in Visit: Patient.  AWV Questionnaire: No: Patient Medicare AWV questionnaire was not completed prior to this visit.  Cardiac Risk Factors include: advanced age (>68men, >42 women);dyslipidemia;hypertension;male gender     Objective:    Today's Vitals   09/17/23 1533  BP: 120/70  Weight: 193 lb 3.2 oz (87.6 kg)  Height: 5' 8 (1.727 m)   Body mass index is 29.38 kg/m.     09/17/2023    3:45 PM 05/29/2022    2:22 PM 05/23/2021   11:02 AM 06/07/2020    7:54 AM 05/17/2020    1:44 PM 10/18/2017   10:41 AM 12/13/2016    1:31 PM  Advanced Directives  Does Patient Have a Medical Advance Directive? No Yes No Yes Yes Yes  No   Type of Aeronautical engineer of [place];Living will Healthcare Power of [place];Living will Healthcare Power of [place];Living will   Copy of Healthcare Power of Attorney in Chart?    No - copy requested No - copy requested No - copy requested    Would patient like information on creating a medical advance directive? No - Patient declined  No - Patient declined    No - Patient declined      Data saved with a previous flowsheet row definition    Current Medications (verified) Outpatient Encounter Medications as of 09/17/2023  Medication Sig   acetaminophen  (TYLENOL ) 500 MG tablet Take 500 mg by mouth every 6 (six) hours as needed.   fluticasone  (FLONASE ) 50 MCG/ACT nasal spray Place 2 sprays into both nostrils daily.   hydrochlorothiazide  (MICROZIDE ) 12.5 MG capsule TAKE 1 CAPSULE BY MOUTH EVERY DAY   lansoprazole  (PREVACID ) 30 MG capsule Take 30 mg by mouth daily at  12 noon.   metoprolol  tartrate (LOPRESSOR ) 25 MG tablet Take 1 tablet (25 mg total) by mouth 2 (two) times daily.   montelukast  (SINGULAIR ) 10 MG tablet Take 1 tablet (10 mg total) by mouth at bedtime. (Patient taking differently: Take 10 mg by mouth at bedtime. TAKES OCCASIONALLY)   valsartan  (DIOVAN ) 40 MG tablet Take 1 tablet (40 mg total) by mouth daily.   ezetimibe  (ZETIA ) 10 MG tablet Take 1 tablet (10 mg total) by mouth daily. (Patient not taking: Reported on 09/17/2023)   primidone  (MYSOLINE ) 50 MG tablet Take 0.5-1 tablets (25-50 mg total) by mouth in the morning and at bedtime. (Patient not taking: Reported on 09/17/2023)   UNABLE TO FIND Apply twice a day to temples for 4-5 days   No facility-administered encounter medications on file as of 09/17/2023.    Allergies (verified) Betadine [povidone iodine], Crestor  [rosuvastatin ], Fentanyl , and Penicillins   History: Past Medical History:  Diagnosis Date   Acid reflux 08/06/2014   Allergy    Anxiety 08/06/2014   Arthritis    Blood in feces 08/06/2014   Brachial neuritis 01/02/2008   Cancer (HCC)    forehead   Decreased libido 08/06/2014   Dizziness and giddiness 08/06/2014   ED (erectile dysfunction) of organic origin 08/06/2014   GERD (gastroesophageal reflux disease)    H/O arthrodesis 08/06/2014   Hypertension  Leg varices 08/06/2014   Mixed hyperlipidemia 07/30/2023   Past Surgical History:  Procedure Laterality Date   CARDIAC CATHETERIZATION  1993   CERVICAL SPINE SURGERY  2009   radiculopathy from past fusion and arthritic changes   COLONOSCOPY  2013   COLONOSCOPY WITH PROPOFOL  N/A 06/07/2020   Procedure: COLONOSCOPY WITH PROPOFOL ;  Surgeon: Jinny Carmine, MD;  [facility] ENDOSCOPY;  Service: Endoscopy;  Laterality: N/A;   ESOPHAGOGASTRODUODENOSCOPY ENDOSCOPY     EYE SURGERY  01/02/2018   right eye lid mole removed   LAMINECTOMY  2005   PAROTIDECTOMY Left 12/13/2016   Procedure: TOTAL PAROTIDECTOMY WITH  FACIAL NERVE MONITORING;  Surgeon: Milissa Hamming, MD;  Location: [facility] ORS;  Service: ENT;  Laterality: Left;   SPINE SURGERY  2005     2009    2016   Neck surgery   Family History  Problem Relation Age of Onset   Esophageal cancer Mother    Arthritis Mother    Cancer Mother    Alcohol abuse Father    Prostate cancer Brother    Parkinson's disease Maternal Uncle    Dementia Paternal Grandmother    Social History   Socioeconomic History   Marital status: Married    Spouse name: Dagoberto   Number of children: 2   Years of education: 12   Highest education level: GED or equivalent  Occupational History   Occupation: Retired  Tobacco Use   Smoking status: Never   Smokeless tobacco: Never   Tobacco comments:    I have never been a smoker.  Vaping Use   Vaping status: Never Used  Substance and Sexual Activity   Alcohol use: Never   Drug use: No   Sexual activity: Not Currently    Birth control/protection: None  Other Topics Concern   Not on file  Social History Narrative   Not on file   Social Drivers of Health   Financial Resource Strain: Low Risk  (09/17/2023)   Overall Financial Resource Strain (CARDIA)    Difficulty of Paying Living Expenses: Not hard at all  Food Insecurity: No Food Insecurity (09/17/2023)   Hunger Vital Sign    Worried About Running Out of Food in the Last Year: Never true    Ran Out of Food in the Last Year: Never true  Transportation Needs: No Transportation Needs (09/17/2023)   PRAPARE - Administrator, Civil Service (Medical): No    Lack of Transportation (Non-Medical): No  Physical Activity: Sufficiently Active (09/17/2023)   Exercise Vital Sign    Days of Exercise per Week: 3 days    Minutes of Exercise per Session: 60 min  Stress: No Stress Concern Present (09/17/2023)   [employer] of Occupational Health - Occupational Stress Questionnaire    Feeling of Stress: Not at all  Social Connections: Moderately Integrated  (09/17/2023)   Social Connection and Isolation Panel    Frequency of Communication with Friends and Family: More than three times a week    Frequency of Social Gatherings with Friends and Family: Twice a week    Attends Religious Services: More than 4 times per year    Active Member of [employer] or Organizations: No    Attends Banker Meetings: Never    Marital Status: Married    Tobacco Counseling Counseling given: Not Answered Tobacco comments: I have never been a smoker.    Clinical Intake:  Pre-visit preparation completed: Yes  Pain : No/denies pain     BMI -  recorded: 29.38 Nutritional Status: BMI 25 -29 Overweight Nutritional Risks: None Diabetes: No  Lab Results  Component Value Date   HGBA1C 5.7 (H) 07/01/2023   HGBA1C 5.5 02/28/2021     How often do you need to have someone help you when you read instructions, pamphlets, or other written materials from your doctor or pharmacy?: 1 - Never  Interpreter Needed?: No  Information entered by :: JHONNIE DAS, LPN   Activities of Daily Living     09/17/2023    3:48 PM 09/13/2023   11:06 AM  In your present state of health, do you have any difficulty performing the following activities:  Hearing? 0 0  Vision? 1 0  Difficulty concentrating or making decisions? 0 0  Walking or climbing stairs? 0 0  Dressing or bathing? 0 0  Doing errands, shopping? 0 0  Preparing Food and eating ? N N  Using the Toilet? N N  In the past six months, have you accidently leaked urine? N N  Do you have problems with loss of bowel control? N N  Managing your Medications? N N  Managing your Finances? N N  Housekeeping or managing your Housekeeping? N N    Patient Care Team: Ostwalt, Janna, PA-C as PCP - General (Physician Assistant) Ashley Greig HERO, MD as Referring Physician (Ophthalmology) Francisca Redell BROCKS, MD as Consulting Physician (Urology) Mittie Gaskin, MD as Referring Physician (Ophthalmology) Louis Shove, MD as Consulting Physician (Neurosurgery) Dasher, Alm LABOR, MD (Dermatology)  I have updated your Care Teams any recent Medical Services you may have received from other providers in the past year.     Assessment:   This is a routine wellness examination for Benjamin Gutierrez.  Hearing/Vision screen Hearing Screening - Comments:: WEARS AIDS, BOTH EARS Vision Screening - Comments:: READERS, COMPUTER WORK- DR.BRASINGTON- HAS APPT IN OCTOBER   Goals Addressed             This Visit's Progress    DIET - REDUCE SALT INTAKE TO 2 GRAMS PER DAY OR LESS         Depression Screen     09/17/2023    3:43 PM 07/01/2023   10:45 AM 06/12/2022    3:39 PM 05/29/2022    2:17 PM 05/23/2021   11:01 AM 02/28/2021    9:37 AM 05/17/2020    1:42 PM  PHQ 2/9 Scores  PHQ - 2 Score 0 0 0 0 0 0 0  PHQ- 9 Score 0 0 0   0     Fall Risk     09/17/2023    3:46 PM 09/13/2023   11:06 AM 03/27/2023    2:02 PM 06/12/2022    3:39 PM 05/25/2022    5:00 PM  Fall Risk   Falls in the past year? 1 1 0 0 0  Number falls in past yr: 0 0  0   Injury with Fall? 0 1  0   Risk for fall due to : History of fall(s)   No Fall Risks   Follow up Falls evaluation completed;Falls prevention discussed        MEDICARE RISK AT HOME:  Medicare Risk at Home Any stairs in or around the home?: Yes If so, are there any without handrails?: Yes Home free of loose throw rugs in walkways, pet beds, electrical cords, etc?: Yes Adequate lighting in your home to reduce risk of falls?: Yes Life alert?: No Use of a cane, walker or w/c?: No Grab bars in the  bathroom?: No Shower chair or bench in shower?: No Elevated toilet seat or a handicapped toilet?: Yes  TIMED UP AND GO:  Was the test performed?  Yes  Length of time to ambulate 10 feet: 4 sec Gait steady and fast without use of assistive device  Cognitive Function: 6CIT completed        09/17/2023    3:51 PM 05/29/2022    2:22 PM 10/18/2017   10:45 AM  6CIT Screen  What Year? 0  points 0 points 0 points  What month? 0 points 0 points 0 points  What time? 0 points 0 points 0 points  Count back from 20 0 points 0 points 0 points  Months in reverse 0 points 0 points 0 points  Repeat phrase 0 points 0 points 0 points  Total Score 0 points 0 points 0 points    Immunizations Immunization History  Administered Date(s) Administered   Fluad Quad(high Dose 65+) 12/11/2021   H1N1 01/02/2008   Influenza, High Dose Seasonal PF 11/14/2017, 10/21/2019, 11/30/2020   Influenza-Unspecified 11/22/2015, 12/04/2016, 11/30/2020   Moderna Sars-Covid-2 Vaccination 04/03/2019, 05/02/2019, 01/07/2020   PFIZER Comirnaty(Gray Top)Covid-19 Tri-Sucrose Vaccine 03/20/2021   Pneumococcal Conjugate-13 05/28/2014   Pneumococcal Polysaccharide-23 11/22/2015   Td 11/22/2004   Tdap 10/10/2010   Zoster Recombinant(Shingrix) 09/12/2017, 01/14/2018   Zoster, Live 09/01/2010    Screening Tests Health Maintenance  Topic Date Due   DTaP/Tdap/Td (3 - Td or Tdap) 10/09/2020   COVID-19 Vaccine (5 - 2024-25 season) 10/21/2022   INFLUENZA VACCINE  09/20/2023   Medicare Annual Wellness (AWV)  09/16/2024   Colonoscopy  06/08/2027   Pneumococcal Vaccine: 50+ Years  Completed   Hepatitis C Screening  Completed   Zoster Vaccines- Shingrix  Completed   Hepatitis B Vaccines  Aged Out   HPV VACCINES  Aged Out   Meningococcal B Vaccine  Aged Out    Health Maintenance  Health Maintenance Due  Topic Date Due   DTaP/Tdap/Td (3 - Td or Tdap) 10/09/2020   COVID-19 Vaccine (5 - 2024-25 season) 10/21/2022   Health Maintenance Items Addressed: UP TO DATE ON COLONOSCOPY;WANTS NO MORE COVIDS; DECLINES PNA NOW, NEEDS TDAP; UP TO DATE ON SHINGRIX  Additional Screening:  Vision Screening: Recommended annual ophthalmology exams for early detection of glaucoma and other disorders of the eye. Would you like a referral to an eye doctor? No    Dental Screening: Recommended annual dental exams for proper oral  hygiene  Community Resource Referral / Chronic Care Management: CRR required this visit?  No   CCM required this visit?  No   Plan:    I have personally reviewed and noted the following in the patient's chart:   Medical and social history Use of alcohol, tobacco or illicit drugs  Current medications and supplements including opioid prescriptions. Patient is not currently taking opioid prescriptions. Functional ability and status Nutritional status Physical activity Advanced directives List of other physicians Hospitalizations, surgeries, and ER visits in previous 12 months Vitals Screenings to include cognitive, depression, and falls Referrals and appointments  In addition, I have reviewed and discussed with patient certain preventive protocols, quality metrics, and best practice recommendations. A written personalized care plan for preventive services as well as general preventive health recommendations were provided to patient.   Jhonnie GORMAN Das, LPN   [id number]   After Visit Summary: (In Person-Declined) Patient declined AVS at this time.  Notes: Nothing significant to report at this time.

## 2023-09-17 NOTE — Patient Instructions (Addendum)
 Benjamin Gutierrez , Thank you for taking time out of your busy schedule to complete your Annual Wellness Visit with me. I enjoyed our conversation and look forward to speaking with you again next year. I, as well as your care team,  appreciate your ongoing commitment to your health goals. Please review the following plan we discussed and let me know if I can assist you in the future.   Follow up Visits: Next Medicare AWV with our clinical staff:   09/22/24 @ 3:10 PM IN PERSON Have you seen your provider in the last 6 months (3 months if uncontrolled diabetes)? Yes   Clinician Recommendations:  Aim for 30 minutes of exercise or brisk walking, 6-8 glasses of water, and 5 servings of fruits and vegetables each day. TAKE CARE!      This is a list of the screening recommended for you and due dates:  Health Maintenance  Topic Date Due   DTaP/Tdap/Td vaccine (3 - Td or Tdap) 10/09/2020   COVID-19 Vaccine (5 - 2024-25 season) 10/21/2022   Flu Shot  09/20/2023   Medicare Annual Wellness Visit  09/16/2024   Colon Cancer Screening  06/08/2027   Pneumococcal Vaccine for age over 49  Completed   Hepatitis C Screening  Completed   Zoster (Shingles) Vaccine  Completed   Hepatitis B Vaccine  Aged Out   HPV Vaccine  Aged Out   Meningitis B Vaccine  Aged Out    Advanced directives: (ACP Link)Information on Advanced Care Planning can be found at [place]  Secretary of [facility] Advance Health Care Directives Advance Health Care Directives. [email or web address]  Advance Care Planning is important because it:  [x]  Makes sure you receive the medical care that is consistent with your values, goals, and preferences  [x]  It provides guidance to your family and loved ones and reduces their decisional burden about whether or not they are making the right decisions based on your wishes.  Follow the link provided in your after visit summary or read over the paperwork we have mailed to you to help you started getting your Advance  Directives in place. If you need assistance in completing these, please reach out to us  so that we can help you!

## 2023-09-20 ENCOUNTER — Other Ambulatory Visit: Payer: Self-pay | Admitting: Physician Assistant

## 2023-09-20 DIAGNOSIS — R000 Tachycardia, unspecified: Secondary | ICD-10-CM

## 2023-09-20 DIAGNOSIS — R251 Tremor, unspecified: Secondary | ICD-10-CM

## 2023-10-24 ENCOUNTER — Encounter: Payer: Self-pay | Admitting: Urology

## 2023-10-30 ENCOUNTER — Ambulatory Visit: Admitting: Physician Assistant

## 2023-10-30 ENCOUNTER — Encounter: Payer: Self-pay | Admitting: Physician Assistant

## 2023-10-30 VITALS — BP 110/76 | HR 64 | Temp 98.8°F | Resp 20 | Ht 68.0 in | Wt 189.0 lb

## 2023-10-30 DIAGNOSIS — T466X5D Adverse effect of antihyperlipidemic and antiarteriosclerotic drugs, subsequent encounter: Secondary | ICD-10-CM | POA: Diagnosis not present

## 2023-10-30 DIAGNOSIS — M542 Cervicalgia: Secondary | ICD-10-CM | POA: Diagnosis not present

## 2023-10-30 DIAGNOSIS — E782 Mixed hyperlipidemia: Secondary | ICD-10-CM

## 2023-10-30 DIAGNOSIS — I10 Essential (primary) hypertension: Secondary | ICD-10-CM | POA: Diagnosis not present

## 2023-10-30 DIAGNOSIS — J3089 Other allergic rhinitis: Secondary | ICD-10-CM

## 2023-10-30 DIAGNOSIS — G4489 Other headache syndrome: Secondary | ICD-10-CM | POA: Diagnosis not present

## 2023-10-30 DIAGNOSIS — G720 Drug-induced myopathy: Secondary | ICD-10-CM

## 2023-10-30 MED ORDER — GABAPENTIN 300 MG PO CAPS: ORAL_CAPSULE | ORAL | 0 refills | Status: DC

## 2023-10-30 MED ORDER — LEVOCETIRIZINE DIHYDROCHLORIDE 5 MG PO TABS: 5.0000 mg | ORAL_TABLET | Freq: Every evening | ORAL | 2 refills | Status: DC

## 2023-10-30 NOTE — Progress Notes (Signed)
 Established patient visit  Patient: Benjamin Gutierrez   DOB: 08-02-1950   73 y.o. Male  MRN: [id number] Visit Date: 10/30/2023  Today's healthcare provider: Jolynn Spencer, PA-C   Chief Complaint  Patient presents with   Hypertension   Headache    Headache, dizzy, cough, nausea x 1 week otc: mucinex help temporary, started to have stomach upset that stopped taking it.    Neck Pain    L neck pain, numbness x 1 week has gotten worse with numbness in bilateral arms/hands worse on L side. Discuss possibly another doctor regarding flare up.   Subjective     HPI     Headache    Additional comments: Headache, dizzy, cough, nausea x 1 week otc: mucinex help temporary, started to have stomach upset that stopped taking it.         Neck Pain    Additional comments: L neck pain, numbness x 1 week has gotten worse with numbness in bilateral arms/hands worse on L side. Discuss possibly another doctor regarding flare up.      Last edited by Benjamin Gutierrez, CMA on 10/30/2023  3:07 PM.       Discussed the use of AI scribe software for clinical note transcription with the patient, who gave verbal consent to proceed.  History of Present Illness Benjamin Gutierrez is a 73 year old male with hypertension who presents with headaches and sinus congestion.  He has experienced headaches and sinus congestion for four to five days, likely due to a cold from his wife. The headache is diffuse with ear pain, possibly related to sinus issues. He has no significant nasal drainage but is coughing up phlegm. Eating is difficult without feeling sick. Symptoms, particularly nasal congestion, are improving.  He manages hypertension with metoprolol , valsartan , and hydrochlorothiazide , with good blood pressure control. He experiences dizziness with his current symptoms but no shortness of breath, chest pain, or rapid heartbeat.  He has chronic neck pain and arm weakness from previous neck surgeries, with  intermittent symptoms managed with Tylenol . Muscle cramps in his ribs are relieved with mustard.  He takes montelukast  for allergies and has seasonal pollen issues but is not using antihistamines like Zyrtec.       09/17/2023    3:43 PM 07/01/2023   10:45 AM 06/12/2022    3:39 PM  Depression screen PHQ 2/9  Decreased Interest 0 0 0  Down, Depressed, Hopeless 0 0 0  PHQ - 2 Score 0 0 0  Altered sleeping 0 0 0  Tired, decreased energy 0 0 0  Change in appetite 0 0 0  Feeling bad or failure about yourself  0 0 0  Trouble concentrating 0 0 0  Moving slowly or fidgety/restless 0 0 0  Suicidal thoughts 0 0 0  PHQ-9 Score 0 0 0  Difficult doing work/chores Not difficult at all Not difficult at all Not difficult at all      07/01/2023   10:45 AM  GAD 7 : Generalized Anxiety Score  Nervous, Anxious, on Edge 0  Control/stop worrying 0  Worry too much - different things 0  Trouble relaxing 0  Restless 0  Afraid - awful might happen 0  Anxiety Difficulty Not difficult at all    Medications: Outpatient Medications Prior to Visit  Medication Sig   acetaminophen  (TYLENOL ) 500 MG tablet Take 500 mg by mouth every 6 (six) hours as needed.   ezetimibe  (ZETIA ) 10 MG tablet Take 1 tablet (10  mg total) by mouth daily.   fluticasone  (FLONASE ) 50 MCG/ACT nasal spray Place 2 sprays into both nostrils daily.   hydrochlorothiazide  (MICROZIDE ) 12.5 MG capsule TAKE 1 CAPSULE BY MOUTH EVERY DAY   lansoprazole  (PREVACID ) 30 MG capsule Take 30 mg by mouth daily at 12 noon.   metoprolol  tartrate (LOPRESSOR ) 25 MG tablet TAKE 1 TABLET BY MOUTH TWICE A DAY   montelukast  (SINGULAIR ) 10 MG tablet Take 1 tablet (10 mg total) by mouth at bedtime.   primidone  (MYSOLINE ) 50 MG tablet Take 0.5-1 tablets (25-50 mg total) by mouth in the morning and at bedtime.   valsartan  (DIOVAN ) 40 MG tablet Take 1 tablet (40 mg total) by mouth daily.   [DISCONTINUED] UNABLE TO FIND Apply twice a day to temples for 4-5 days    No facility-administered medications prior to visit.    Review of Systems  All other systems reviewed and are negative.  All negative Except see HPI       Objective    BP 110/76   Pulse 64   Temp 98.8 F (37.1 C) (Oral)   Resp 20   Ht 5' 8 (1.727 m)   Wt 189 lb (85.7 kg)   SpO2 97%   BMI 28.74 kg/m     Physical Exam Vitals reviewed.  Constitutional:      General: He is not in acute distress.    Appearance: Normal appearance. He is not diaphoretic.  HENT:     Head: Normocephalic and atraumatic.  Eyes:     General: No scleral icterus.    Conjunctiva/sclera: Conjunctivae normal.  Cardiovascular:     Rate and Rhythm: Normal rate and regular rhythm.     Pulses: Normal pulses.     Heart sounds: Normal heart sounds. No murmur heard. Pulmonary:     Effort: Pulmonary effort is normal. No respiratory distress.     Breath sounds: Normal breath sounds. No wheezing or rhonchi.  Musculoskeletal:     Cervical back: Neck supple.     Right lower leg: No edema.     Left lower leg: No edema.  Lymphadenopathy:     Cervical: No cervical adenopathy.  Skin:    General: Skin is warm and dry.     Findings: No rash.  Neurological:     Mental Status: He is alert and oriented to person, place, and time. Mental status is at baseline.  Psychiatric:        Mood and Affect: Mood normal.        Behavior: Behavior normal.      No results found for any visits on 10/30/23.      Assessment & Plan Allergic rhinitis/Headache/Acute upper respiratory infection with sinus symptoms Symptoms likely viral, self-limiting, improving. - Nasal saline spray followed by Flonase  twice daily for one week, then once daily as symptoms improve. - Consider Zyrtec if covered by insurance to reduce post-nasal drainage and cough. - Continue montelukast  for allergy management.  Neck pain/Cervical radiculopathy and cervicalgia status post cervical spine surgery Chronic symptoms with worsening neck  pain, weakness, and tingling due to nerve impingement. Previous MRI showed no surgical options. Possible arthritis and nerve pinching exacerbation. - Refer to orthopedic surgeon for evaluation. - Consider neurosurgeon referral. - Prescribe gabapentin  300 mg daily for nerve pain, caution for dizziness. - Recommend physical therapy at [facility]. - Advise hot/cold patches, Voltaren gel, stretching exercises. - Suggest special foam pillow or collar for neck support.  Essential hypertension Chronic and stable Blood pressure generally  well-managed, recent pain episodes may elevate readings. - Continue current antihypertensive regimen with valsartan  40mg , metoprolol  25 mg, hydrochlorothiazide  12.5 mg. - Encourage adequate hydration. Will follow-up  Mixed hyperlipidemia, statin intolerant Chronic Intolerance to statins due to dizziness and muscle cramps. Continue lifestyle modifications Will follow-up  Drug-induced myopathy Likely related to previous statin use, managed with non-pharmacological interventions. - Advise mustard for quick relief of muscle cramps. - Recommend hot/cold patches for muscle pain management.   No orders of the defined types were placed in this encounter.   No follow-ups on file.   The patient was advised to call back or seek an in-person evaluation if the symptoms worsen or if the condition fails to improve as anticipated.  I discussed the assessment and treatment plan with the patient. The patient was provided an opportunity to ask questions and all were answered. The patient agreed with the plan and demonstrated an understanding of the instructions.  I, Robie Oats, PA-C have reviewed all documentation for this visit. The documentation on 10/30/2023  for the exam, diagnosis, procedures, and orders are all accurate and complete.  Benjamin Gutierrez, [facility], MMS [facility] [phone] (phone) [phone] (fax)  [facility]

## 2023-11-02 ENCOUNTER — Other Ambulatory Visit: Payer: Self-pay | Admitting: Physician Assistant

## 2023-11-08 ENCOUNTER — Ambulatory Visit
Admission: RE | Admit: 2023-11-08 | Discharge: 2023-11-08 | Disposition: A | Discharge status: Home / Self Care | Source: Ambulatory Visit

## 2023-11-08 ENCOUNTER — Other Ambulatory Visit: Payer: Self-pay

## 2023-11-08 ENCOUNTER — Ambulatory Visit: Admission: RE | Admit: 2023-11-08 | Discharge: 2023-11-08 | Disposition: A | Discharge status: Home / Self Care

## 2023-11-08 DIAGNOSIS — M542 Cervicalgia: Secondary | ICD-10-CM | POA: Insufficient documentation

## 2023-11-08 DIAGNOSIS — M47812 Spondylosis without myelopathy or radiculopathy, cervical region: Secondary | ICD-10-CM | POA: Diagnosis not present

## 2023-11-08 DIAGNOSIS — Z981 Arthrodesis status: Secondary | ICD-10-CM | POA: Diagnosis not present

## 2023-11-08 DIAGNOSIS — R200 Anesthesia of skin: Secondary | ICD-10-CM | POA: Diagnosis not present

## 2023-11-14 ENCOUNTER — Ambulatory Visit (INDEPENDENT_AMBULATORY_CARE_PROVIDER_SITE_OTHER)

## 2023-11-14 DIAGNOSIS — M47812 Spondylosis without myelopathy or radiculopathy, cervical region: Secondary | ICD-10-CM

## 2023-11-14 DIAGNOSIS — M542 Cervicalgia: Secondary | ICD-10-CM

## 2023-11-14 NOTE — Progress Notes (Signed)
 Orthopaedic Surgery New Patient Visit   History of Present Illness: The patient is a 73 y.o. male seen in clinic for neck pain.  Patient has an extensive history of neck related pathology including 3 reported surgeries spanning more than 20-year period.  He recalls his initial surgery was rather emergent due to compression on his spinal cord and cervical nerves.  Over the years, patient had on and off radicular symptoms including pain, weakness and numbness.  This resulted in several other procedures.  He was most recently seen by his neurosurgeon in 2024 for neck pain and radicular symptoms.  He was told at this time that there was no other treatment modalities that could be offered given his situation.  Patient reports the pain in his neck is intermittent but has been more persistent over the last 12 to 18 months.  He denies any new injury.  He did undergo a course of physical therapy about 2 to 3 years ago with good relief.  His symptoms are worse with rotating his neck as well as sleeping on his left side.  He has noticed decreased strength and weakness in the left upper extremity as well as numbness in bilateral hands.  He will take anti-inflammatories as needed.  He also notes ongoing tremors for which he is seeing a neurologist.  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     Past Medical, Social and Family History: Past Medical History:  Diagnosis Date   Acid reflux 08/06/2014   Allergy    Anxiety 08/06/2014   Arthritis    Blood in feces 08/06/2014   Brachial neuritis 01/02/2008   Cancer (HCC)    forehead   Decreased libido 08/06/2014   Dizziness and giddiness 08/06/2014   ED (erectile dysfunction) of organic origin 08/06/2014   GERD (gastroesophageal reflux disease)    H/O arthrodesis 08/06/2014   Hypertension    Leg varices 08/06/2014   Mixed hyperlipidemia 07/30/2023   Past Surgical History:  Procedure Laterality Date   CARDIAC CATHETERIZATION  1993   CERVICAL SPINE  SURGERY  2009   radiculopathy from past fusion and arthritic changes   COLONOSCOPY  2013   COLONOSCOPY WITH PROPOFOL  N/A 06/07/2020   Procedure: COLONOSCOPY WITH PROPOFOL ;  Surgeon: Jinny Carmine, MD;  Location: [facility] ENDOSCOPY;  Service: Endoscopy;  Laterality: N/A;   ESOPHAGOGASTRODUODENOSCOPY ENDOSCOPY     EYE SURGERY  01/02/2018   right eye lid mole removed   LAMINECTOMY  2005   PAROTIDECTOMY Left 12/13/2016   Procedure: TOTAL PAROTIDECTOMY WITH FACIAL NERVE MONITORING;  Surgeon: Milissa Hamming, MD;  Location: [facility] ORS;  Service: ENT;  Laterality: Left;   SPINE SURGERY  2005     2009    2016   Neck surgery   Allergies  Allergen Reactions   Betadine [Povidone Iodine] Itching   Crestor  [Rosuvastatin ] Other (See Comments)    Muscle aches   Fentanyl  Other (See Comments)    Pain patch and after passed out and admitted to the hospital   Penicillins Rash   Current Outpatient Medications on File Prior to Visit  Medication Sig Dispense Refill   acetaminophen  (TYLENOL ) 500 MG tablet Take 500 mg by mouth every 6 (six) hours as needed.     ezetimibe  (ZETIA ) 10 MG tablet Take 1 tablet (10 mg total) by mouth daily. 30 tablet 0   fluticasone  (FLONASE ) 50 MCG/ACT nasal spray Place 2 sprays into both nostrils daily.     gabapentin  (NEURONTIN ) 300 MG capsule Take 1 tablet  by mouth as needed for pain 15 capsule 0   hydrochlorothiazide  (MICROZIDE ) 12.5 MG capsule TAKE 1 CAPSULE BY MOUTH EVERY DAY 30 capsule 1   lansoprazole  (PREVACID ) 30 MG capsule Take 30 mg by mouth daily at 12 noon.     levocetirizine (XYZAL ) 5 MG tablet Take 1 tablet (5 mg total) by mouth every evening. 30 tablet 2   metoprolol  tartrate (LOPRESSOR ) 25 MG tablet TAKE 1 TABLET BY MOUTH TWICE A DAY 90 tablet 3   montelukast  (SINGULAIR ) 10 MG tablet Take 1 tablet (10 mg total) by mouth at bedtime. 90 tablet 1   primidone  (MYSOLINE ) 50 MG tablet Take 0.5-1 tablets (25-50 mg total) by mouth in the morning and at bedtime. 30 tablet 5    valsartan  (DIOVAN ) 40 MG tablet Take 1 tablet (40 mg total) by mouth daily. 90 tablet 3   No current facility-administered medications on file prior to visit.   Social History   Tobacco Use   Smoking status: Never   Smokeless tobacco: Never   Tobacco comments:    I have never been a smoker.  Vaping Use   Vaping status: Never Used  Substance Use Topics   Alcohol use: Never   Drug use: No      I have reviewed past medical, surgical, social and family history, medications and allergies as documented in the EMR.  Review of Systems - A ROS was performed including pertinent positives and negatives as documented in the HPI.     Physical Exam:  General/Constitutional: NAD Vascular: No edema, swelling or tenderness, except as noted in detailed exam Integumentary: No impressive skin lesions present, except as noted in detailed exam Neuro/Psych: Normal mood and affect, oriented to person, place and time Musculoskeletal: Normal, except as noted in detailed exam and in HPI   Focused examination:  On examination of the cervical spine, skin is intact anteriorly and posteriorly.  Patient does have kyphotic posture.  Well-healed anterior based scar noted about the neck.  Muscle atrophy noted about bilateral hands, specifically within the 1st webspace.  Tenderness to palpation about the left paraspinal musculature.  Nontender about the midline spine or right paraspinal musculature.  Range of motion slightly limited in flexion and extension as well as rotation.  Very limited range of motion with sidebending bilaterally.   MSK (spine):  -Strength exam      Left  Right Grip strength                4-/5  4-/5 Interosseus   4-/5   4-/5 Wrist extension  4+/5  5/5 Wrist flexion   5/5  5/5 Elbow flexion   5/5  5/5 Deltoid    5/5  5/5   -Sensory exam   Sensation intact to light touch in C5-T1 nerve distributions of bilateral upper extremities. However, diminished over bilateral C8  distribution.  Positive tinel's bilateral elbows   -Spurling: Positive for pain on left side -Hoffman sign: negative    Vascular/Lymphatic: 2+ radial pulse, fingers warm and well-perfused         XR spine imaging: X-rays of the cervical spine including AP and lateral views were obtained on 11/08/2023 at outpatient facility and were available for my review.  Per my interpretation, there are no fractures or dislocation.  Notable kyphosis throughout the cervical spine.  Evidence of prior anterior based hardware with plate and screw fixation spanning C3-C5 and C6-C7 without obvious loosening of screws.  Bony fusion noted spanning this region of the mid  to lower cervical spine with evidence of degenerative changes.    Assessment:  Cervical spine pain and radicular symptoms in setting of prior ACDF at multiple segments Possible bilateral cubital tunnel syndrome  Plan:  Patient was seen and examined in office today. We reviewed patient's history, examination, and imaging in detail. Based on information available for this encounter, patient does have cervical spine related pain as well as radicular symptoms that are somewhat new in nature.  We discussed the complexity of his situation and appropriateness of following up with cervical spine specialist for further evaluation and treatment recommendations.  We have recommended referral to our neurosurgical partners at this time. He may follow up with our clinic for any future orthopedic concerns.   All questions, concerns and comments were addressed to the best of my ability.  Follow-up: PRN   Arlyss GEANNIE Schneider, DO Orthopedic Surgery & Sports Medicine [facility]

## 2023-11-21 ENCOUNTER — Other Ambulatory Visit: Payer: Self-pay | Admitting: Physician Assistant

## 2023-11-21 DIAGNOSIS — M542 Cervicalgia: Secondary | ICD-10-CM

## 2023-12-04 NOTE — Progress Notes (Unsigned)
 Referring Physician:  Gust Molly, DO [address] [place],  [place] [address]  Primary Physician:  Dineen Channel, PA-C  History of Present Illness: 12/12/2023 Benjamin Gutierrez has a history of right BBB, HTN, ASCVD, chronic bronchitis, GERD, benign essential tremor, prediabetes, mixed hyperlipidemia.   History of cervical surgery x 3. Has seen [facility] in the past and had surgeries with Dr. Malcolm.   He has constant left sided neck pain x 2 years. No arm pain. He has numbness in right small and ring finger x years. He feels like he has weakness in both hands. He has chronic weakness in left arm since his surgeries. Pain is worse with riding lawnmower. Pain is better with heat and medications.   He recently was started on celebrex . He takes neurontin  prn.   He is in PT for his neck and lower back- first visit yesterday.   Tobacco use: Does not smoke.   Bowel/Bladder Dysfunction: none  Conservative measures:  Physical therapy:  has participated in the past but none within the last year Multimodal medical therapy including regular antiinflammatories: tylenol , gabapentin  Injections:  no epidural steroid injections  Past Surgery:  3 prior neck surgeries, 2005, 2009 and 2016-with Dr Benjamin Gutierrez has no symptoms of cervical myelopathy.  The symptoms are causing a significant impact on the patient's life.   Review of Systems:  A 10 point review of systems is negative, except for the pertinent positives and negatives detailed in the HPI.  Past Medical History: Past Medical History:  Diagnosis Date   Acid reflux 08/06/2014   Allergy    Anxiety 08/06/2014   Arthritis    Blood in feces 08/06/2014   Brachial neuritis 01/02/2008   Cancer (HCC)    forehead   Decreased libido 08/06/2014   Dizziness and giddiness 08/06/2014   ED (erectile dysfunction) of organic origin 08/06/2014   GERD (gastroesophageal reflux disease)    H/O  arthrodesis 08/06/2014   Hypertension    Leg varices 08/06/2014   Mixed hyperlipidemia 07/30/2023   Oxygen deficiency    CPAP   Sleep apnea Jan.  2024    Past Surgical History: Past Surgical History:  Procedure Laterality Date   CARDIAC CATHETERIZATION  1993   CERVICAL SPINE SURGERY  2009   radiculopathy from past fusion and arthritic changes   COLONOSCOPY  2013   COLONOSCOPY WITH PROPOFOL  N/A 06/07/2020   Procedure: COLONOSCOPY WITH PROPOFOL ;  Surgeon: Jinny Carmine, MD;  Location: [facility] ENDOSCOPY;  Service: Endoscopy;  Laterality: N/A;   ESOPHAGOGASTRODUODENOSCOPY ENDOSCOPY     EYE SURGERY  01/02/2018   right eye lid mole removed   LAMINECTOMY  2005   PAROTIDECTOMY Left 12/13/2016   Procedure: TOTAL PAROTIDECTOMY WITH FACIAL NERVE MONITORING;  Surgeon: Milissa Hamming, MD;  Location: [facility] ORS;  Service: ENT;  Laterality: Left;   SPINE SURGERY  2005     2009    2016   Neck surgery    Allergies: Allergies as of 12/12/2023 - Review Complete 12/09/2023  Allergen Reaction Noted   Betadine [povidone iodine] Itching 08/06/2014   Crestor  [rosuvastatin ] Other (See Comments) 08/10/2022   Fentanyl  Other (See Comments) 11/15/2016   Penicillins Rash 08/06/2014    Medications: Outpatient Encounter Medications as of 12/12/2023  Medication Sig   acetaminophen  (TYLENOL ) 500 MG tablet Take 500 mg by mouth every 6 (six) hours as needed.   celecoxib  (CELEBREX ) 200 MG capsule Take 1 capsule (200 mg total) by mouth 2 (two)  times daily.   ezetimibe  (ZETIA ) 10 MG tablet Take 1 tablet (10 mg total) by mouth daily.   fluticasone  (FLONASE ) 50 MCG/ACT nasal spray Place 2 sprays into both nostrils daily.   gabapentin  (NEURONTIN ) 300 MG capsule Take 1 tablet by mouth as needed for pain   hydrochlorothiazide  (MICROZIDE ) 12.5 MG capsule Take 1 capsule (12.5 mg total) by mouth daily.   lansoprazole  (PREVACID ) 30 MG capsule Take 30 mg by mouth daily at 12 noon.   levocetirizine (XYZAL ) 5 MG tablet  TAKE 1 TABLET BY MOUTH EVERY DAY IN THE EVENING (Patient taking differently: Take 5 mg by mouth daily as needed for allergies.)   metoprolol  tartrate (LOPRESSOR ) 25 MG tablet TAKE 1 TABLET BY MOUTH TWICE A DAY   valsartan  (DIOVAN ) 40 MG tablet Take 1 tablet (40 mg total) by mouth daily.   [DISCONTINUED] hydrochlorothiazide  (MICROZIDE ) 12.5 MG capsule TAKE 1 CAPSULE BY MOUTH EVERY DAY (Patient not taking: Reported on 12/09/2023)   [DISCONTINUED] montelukast  (SINGULAIR ) 10 MG tablet Take 1 tablet (10 mg total) by mouth at bedtime. (Patient not taking: Reported on 12/09/2023)   [DISCONTINUED] primidone  (MYSOLINE ) 50 MG tablet Take 0.5-1 tablets (25-50 mg total) by mouth in the morning and at bedtime.   No facility-administered encounter medications on file as of 12/12/2023.    Social History: Social History   Tobacco Use   Smoking status: Never   Smokeless tobacco: Never   Tobacco comments:    I have never been a smoker.  Vaping Use   Vaping status: Never Used  Substance Use Topics   Alcohol use: Never   Drug use: No    Family Medical History: Family History  Problem Relation Age of Onset   Esophageal cancer Mother    Arthritis Mother    Cancer Mother    Alcohol abuse Father    Prostate cancer Brother    Cancer Brother    Parkinson's disease Maternal Uncle    Dementia Paternal Grandmother     Physical Examination: Vitals:   12/12/23 1310  BP: 130/78    General: Patient is well developed, well nourished, calm, collected, and in no apparent distress. Attention to examination is appropriate.  Respiratory: Patient is breathing without any difficulty.   NEUROLOGICAL:     Awake, alert, oriented to person, place, and time.  Speech is clear and fluent. Fund of knowledge is appropriate.   Cranial Nerves: Pupils equal round and reactive to light.  Facial tone is symmetric.    Well healed cervical incisions.   No posterior cervical tenderness. Mild tenderness in left  trapezial region.   No abnormal lesions on exposed skin.   Strength: Side Biceps Triceps Deltoid Interossei Grip Wrist Ext. Wrist Flex.  R 5 5 5 5 5 5 5   L 5 5 5 5 5 4 4    Side Iliopsoas Quads Hamstring PF DF EHL  R 5 5 5 5 5 5   L 5 5 5 5 5 5    Reflexes are 2+ and symmetric at the biceps, brachioradialis, patella and achilles.   Hoffman's is absent.  Clonus is not present.   Bilateral upper and lower extremity sensation is intact to light touch.     Gait is normal.     Medical Decision Making  Imaging: Cervical xrays dated 11/08/23:  FINDINGS:   BONES: No acute fracture. Status post C3-C5 ACDF and C7-T1 ACDF. No aggressive appearing osseous lesion. Alignment is normal.   DISCS AND DEGENERATIVE CHANGES: Osseous fusion at C3-C7.  SOFT TISSUES: No prevertebral soft tissue swelling. The visualized lungs appear clear.   IMPRESSION: 1. No acute abnormalities. 2. Postsurgical changes, as above.   Electronically signed by: Pinkie Pebbles MD 11/14/2023 08:45 PM EDT RP Workstation: [id number]   Cervical MRI dated 04/26/22:       I have personally reviewed the images and agree with the above interpretation.  Assessment and Plan: Benjamin Gutierrez has a history of cervical surgery x 3. Has seen [facility] in the past and had surgeries with Dr. Malcolm.   He has constant left sided neck pain x 2 years. No arm pain. He has numbness in right small and ring finger x years. He feels like he has weakness in both hands. He has chronic weakness in left arm since his surgeries.   He is s/p ACDF C3-C5 and C7-T1, he is fused C3-C7. 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   Current left sided neck pain appears more myofascial in nature. He has some minimal weakness in left arm that is chronic.   Treatment options discussed with patient and following plan made:    - Agree with PT for cervical spine.  - Continue on celebrex  from other providers.  - If his pain gets worse or does not improve, consider further imaging such as cervical CT.  - Follow up with me in 6-8 weeks (after PT).  - Can also discuss possible EMG to evaluate numbness/tingling in right hand if it is bothersome.   I spent a total of 35 minutes in face-to-face and non-face-to-face activities related to this patient's care today including review of outside records, review of imaging, review of symptoms, physical exam, discussion of differential diagnosis, discussion of treatment options, and documentation.   Thank you for involving me in the care of this patient.   Glade Boys PA-C Dept. of Neurosurgery

## 2023-12-09 ENCOUNTER — Ambulatory Visit (INDEPENDENT_AMBULATORY_CARE_PROVIDER_SITE_OTHER): Admitting: Physician Assistant

## 2023-12-09 ENCOUNTER — Encounter: Payer: Self-pay | Admitting: Physician Assistant

## 2023-12-09 VITALS — BP 148/72 | HR 49 | Temp 97.7°F | Ht 68.0 in | Wt 191.8 lb

## 2023-12-09 DIAGNOSIS — M545 Low back pain, unspecified: Secondary | ICD-10-CM | POA: Diagnosis not present

## 2023-12-09 DIAGNOSIS — I10 Essential (primary) hypertension: Secondary | ICD-10-CM | POA: Diagnosis not present

## 2023-12-09 DIAGNOSIS — M542 Cervicalgia: Secondary | ICD-10-CM | POA: Diagnosis not present

## 2023-12-09 MED ORDER — CELECOXIB 200 MG PO CAPS: 200.0000 mg | ORAL_CAPSULE | Freq: Two times a day (BID) | ORAL | 1 refills | Status: AC

## 2023-12-09 MED ORDER — HYDROCHLOROTHIAZIDE 12.5 MG PO CAPS
12.5000 mg | ORAL_CAPSULE | Freq: Every day | ORAL | 1 refills | Status: AC
Start: 2023-12-09 — End: ?

## 2023-12-09 NOTE — Progress Notes (Signed)
 Established patient visit  Patient: Benjamin Gutierrez   DOB: 1950/02/27   73 y.o. Male  MRN: [id number] Visit Date: 12/09/2023  Today's healthcare provider: Jolynn Spencer, PA-C   Chief Complaint  Patient presents with   Hypertension    Patient presents with htn, has been monitoring bP for past few days and sees readings of 140s/ 80s and as high as 170-180 diastolic. Patient states he has been having back and neck pain and thought that may contribute to it. Has headache when blood pressure reads higher     Subjective     HPI     Hypertension    Additional comments: Patient presents with htn, has been monitoring bP for past few days and sees readings of 140s/ 80s and as high as 170-180 diastolic. Patient states he has been having back and neck pain and thought that may contribute to it. Has headache when blood pressure reads higher        Last edited by Cherry Chiquita HERO, CMA on 12/09/2023 11:03 AM.       Discussed the use of AI scribe software for clinical note transcription with the patient, who gave verbal consent to proceed.  History of Present Illness Benjamin Gutierrez is a 73 year old male with hypertension who presents with elevated blood pressure and back pain.  Blood pressure readings have been as high as 176 mmHg, often in the morning, possibly related to stress, pain, or caffeine. Anxiety occurs with high readings. Current medications include metoprolol  25 mg twice daily and valsartan  40 mg, though he takes only half of the valsartan  tablet. Hydrochlorothiazide  12.5 mg has not been taken for a month due to prescription issues.  Lower back pain has been present for a few weeks, described as a muscle strain. He uses ibuprofen, naproxen , and lidocaine  patches for relief. The pain is bothersome but not severe. He is scheduled to see a neurosurgeon for neck pain and uses gabapentin  for management.  He uses a CPAP machine for sleep apnea, which is beneficial. Occasional  shortness of breath and dizziness are noted but not persistent. Zetia  is taken for lipid management.       12/09/2023   11:06 AM 09/17/2023    3:43 PM 07/01/2023   10:45 AM  Depression screen PHQ 2/9  Decreased Interest 0 0 0  Down, Depressed, Hopeless 0 0 0  PHQ - 2 Score 0 0 0  Altered sleeping 0 0 0  Tired, decreased energy 0 0 0  Change in appetite 0 0 0  Feeling bad or failure about yourself  0 0 0  Trouble concentrating 0 0 0  Moving slowly or fidgety/restless 0 0 0  Suicidal thoughts 0 0 0  PHQ-9 Score 0 0 0  Difficult doing work/chores  Not difficult at all Not difficult at all      12/09/2023   11:06 AM 07/01/2023   10:45 AM  GAD 7 : Generalized Anxiety Score  Nervous, Anxious, on Edge 0 0  Control/stop worrying 0 0  Worry too much - different things 0 0  Trouble relaxing 0 0  Restless 0 0  Easily annoyed or irritable 0   Afraid - awful might happen 0 0  Total GAD 7 Score 0   Anxiety Difficulty  Not difficult at all    Medications: Outpatient Medications Prior to Visit  Medication Sig   acetaminophen  (TYLENOL ) 500 MG tablet Take 500 mg by mouth every 6 (six) hours as needed.  ezetimibe  (ZETIA ) 10 MG tablet Take 1 tablet (10 mg total) by mouth daily.   fluticasone  (FLONASE ) 50 MCG/ACT nasal spray Place 2 sprays into both nostrils daily.   gabapentin  (NEURONTIN ) 300 MG capsule Take 1 tablet by mouth as needed for pain   lansoprazole  (PREVACID ) 30 MG capsule Take 30 mg by mouth daily at 12 noon.   levocetirizine (XYZAL ) 5 MG tablet TAKE 1 TABLET BY MOUTH EVERY DAY IN THE EVENING (Patient taking differently: Take 5 mg by mouth daily as needed for allergies.)   metoprolol  tartrate (LOPRESSOR ) 25 MG tablet TAKE 1 TABLET BY MOUTH TWICE A DAY   valsartan  (DIOVAN ) 40 MG tablet Take 1 tablet (40 mg total) by mouth daily.   montelukast  (SINGULAIR ) 10 MG tablet Take 1 tablet (10 mg total) by mouth at bedtime. (Patient not taking: Reported on 12/09/2023)   primidone   (MYSOLINE ) 50 MG tablet Take 0.5-1 tablets (25-50 mg total) by mouth in the morning and at bedtime.   [DISCONTINUED] hydrochlorothiazide  (MICROZIDE ) 12.5 MG capsule TAKE 1 CAPSULE BY MOUTH EVERY DAY (Patient not taking: Reported on 12/09/2023)   No facility-administered medications prior to visit.    Review of Systems All negative Except see HPI       Objective    BP (!) 148/72 (BP Location: Left Arm, Patient Position: Sitting, Cuff Size: Normal)   Pulse (!) 49   Temp 97.7 F (36.5 C) (Oral)   Ht 5' 8 (1.727 m)   Wt 191 lb 12.8 oz (87 kg)   SpO2 97%   BMI 29.16 kg/m     Physical Exam Vitals reviewed.  Constitutional:      General: He is not in acute distress.    Appearance: Normal appearance. He is not diaphoretic.  HENT:     Head: Normocephalic and atraumatic.  Eyes:     General: No scleral icterus.    Conjunctiva/sclera: Conjunctivae normal.  Cardiovascular:     Rate and Rhythm: Normal rate and regular rhythm.     Pulses: Normal pulses.     Heart sounds: Normal heart sounds. No murmur heard. Pulmonary:     Effort: Pulmonary effort is normal. No respiratory distress.     Breath sounds: Normal breath sounds. No wheezing or rhonchi.  Musculoskeletal:     Cervical back: Neck supple.     Right lower leg: No edema.     Left lower leg: No edema.  Lymphadenopathy:     Cervical: No cervical adenopathy.  Skin:    General: Skin is warm and dry.     Findings: No rash.  Neurological:     Mental Status: He is alert and oriented to person, place, and time. Mental status is at baseline.  Psychiatric:        Mood and Affect: Mood normal.        Behavior: Behavior normal.      No results found for any visits on 12/09/23.       Assessment & Plan Hypertension Chronic and unstable Blood pressure elevated, possibly due to pain, stress, caffeine, ibuprofen, and anxiety. Current medications: metoprolol , valsartan , gabapentin , Zetia , and possibly NSAIDs (ibuprofen or  naproxen ). - Take full tablet of valsartan  in the evening. - Resume hydrochlorothiazide  12.5 mg in the morning. - Monitor blood pressure regularly. Drink plenty of water/green tea - Bring blood pressure cuff for calibration next appointment. Will follow-up  Chronic low back pain Recent low back pain likely due to muscle strain. NSAIDs may elevate blood pressure. - Use Tylenol   for pain management. - Consider Celebrex  if Tylenol  insufficient, monitor for adverse effects. - Apply heat, ice, massage. - Use lidocaine  patches as needed. - Consult orthopedics if pain persists or worsens. - Consider physical therapy.  Chronic neck pain Managed with gabapentin  and physical therapy. Pain not severe. Neurosurgeon evaluation scheduled. - Continue gabapentin . - Attend neurosurgeon appointment. - Consider physical therapy.  Obstructive sleep apnea Managed with consistent CPAP use. - Continue CPAP every night.  Hyperlipidemia Chronic and stable Managed with Zetia . - Continue Zetia . Continue with lifestyle modifications Will follow-up    No orders of the defined types were placed in this encounter.   No follow-ups on file.   The patient was advised to call back or seek an in-person evaluation if the symptoms worsen or if the condition fails to improve as anticipated.  I discussed the assessment and treatment plan with the patient. The patient was provided an opportunity to ask questions and all were answered. The patient agreed with the plan and demonstrated an understanding of the instructions.  I, Jandiel Magallanes, PA-C have reviewed all documentation for this visit. The documentation on 12/09/2023  for the exam, diagnosis, procedures, and orders are all accurate and complete.  Jolynn Spencer, [facility], MMS [facility] [phone] (phone) [phone] (fax)  [facility]

## 2023-12-11 DIAGNOSIS — M545 Low back pain, unspecified: Secondary | ICD-10-CM | POA: Diagnosis not present

## 2023-12-11 DIAGNOSIS — M542 Cervicalgia: Secondary | ICD-10-CM | POA: Diagnosis not present

## 2023-12-11 DIAGNOSIS — R293 Abnormal posture: Secondary | ICD-10-CM | POA: Diagnosis not present

## 2023-12-12 ENCOUNTER — Other Ambulatory Visit: Payer: Self-pay | Admitting: Family Medicine

## 2023-12-12 ENCOUNTER — Ambulatory Visit: Admitting: Orthopedic Surgery

## 2023-12-12 ENCOUNTER — Inpatient Hospital Stay
Admission: RE | Admit: 2023-12-12 | Discharge: 2023-12-12 | Disposition: A | Discharge status: Home / Self Care | Payer: Self-pay | Source: Ambulatory Visit | Attending: Orthopedic Surgery | Admitting: Orthopedic Surgery

## 2023-12-12 ENCOUNTER — Encounter: Payer: Self-pay | Admitting: Orthopedic Surgery

## 2023-12-12 VITALS — BP 130/78 | Ht 68.0 in | Wt 189.5 lb

## 2023-12-12 DIAGNOSIS — M47812 Spondylosis without myelopathy or radiculopathy, cervical region: Secondary | ICD-10-CM | POA: Diagnosis not present

## 2023-12-12 DIAGNOSIS — M542 Cervicalgia: Secondary | ICD-10-CM

## 2023-12-12 DIAGNOSIS — M9971 Connective tissue and disc stenosis of intervertebral foramina of cervical region: Secondary | ICD-10-CM

## 2023-12-12 DIAGNOSIS — Z981 Arthrodesis status: Secondary | ICD-10-CM

## 2023-12-12 DIAGNOSIS — Z049 Encounter for examination and observation for unspecified reason: Secondary | ICD-10-CM

## 2023-12-12 NOTE — Patient Instructions (Signed)
 It was so nice to see you today. Thank you so much for coming in.    Overall, your imaging looks okay. You are fused from C3-C7. Fusion at C7-T1 looks okay as well. You have some mild wear and tear above the fusion (C2-C3) and below the fusion (T1-T2). I think a lot of your pain is more muscular in nature.   I agree with PT for your neck. I think it will help with your pain.   I want to see you back in 6 weeks to check on your progress. If things are no better or worse, we will discuss further imaging (likely a CT scan).   Please do not hesitate to call if you have any questions or concerns. You can also message me in MyChart.   Glade Boys PA-C [phone]     The physicians and staff at [facility] are committed to providing excellent care. You may receive a survey asking for feedback about your experience at our office. We value you your feedback and appreciate you taking the time to to fill it out. The [facility] leadership team is also available to discuss your experience in person, feel free to contact us  [phone].

## 2023-12-16 DIAGNOSIS — D2272 Melanocytic nevi of left lower limb, including hip: Secondary | ICD-10-CM | POA: Diagnosis not present

## 2023-12-16 DIAGNOSIS — D044 Carcinoma in situ of skin of scalp and neck: Secondary | ICD-10-CM | POA: Diagnosis not present

## 2023-12-16 DIAGNOSIS — L570 Actinic keratosis: Secondary | ICD-10-CM | POA: Diagnosis not present

## 2023-12-16 DIAGNOSIS — D485 Neoplasm of uncertain behavior of skin: Secondary | ICD-10-CM | POA: Diagnosis not present

## 2023-12-16 DIAGNOSIS — L905 Scar conditions and fibrosis of skin: Secondary | ICD-10-CM | POA: Diagnosis not present

## 2023-12-16 DIAGNOSIS — D225 Melanocytic nevi of trunk: Secondary | ICD-10-CM | POA: Diagnosis not present

## 2023-12-16 DIAGNOSIS — C44329 Squamous cell carcinoma of skin of other parts of face: Secondary | ICD-10-CM | POA: Diagnosis not present

## 2023-12-16 DIAGNOSIS — D2261 Melanocytic nevi of right upper limb, including shoulder: Secondary | ICD-10-CM | POA: Diagnosis not present

## 2023-12-16 DIAGNOSIS — D2262 Melanocytic nevi of left upper limb, including shoulder: Secondary | ICD-10-CM | POA: Diagnosis not present

## 2023-12-16 DIAGNOSIS — Z85828 Personal history of other malignant neoplasm of skin: Secondary | ICD-10-CM | POA: Diagnosis not present

## 2023-12-18 DIAGNOSIS — M545 Low back pain, unspecified: Secondary | ICD-10-CM | POA: Diagnosis not present

## 2023-12-18 DIAGNOSIS — R293 Abnormal posture: Secondary | ICD-10-CM | POA: Diagnosis not present

## 2023-12-18 DIAGNOSIS — M542 Cervicalgia: Secondary | ICD-10-CM | POA: Diagnosis not present

## 2023-12-20 DIAGNOSIS — M545 Low back pain, unspecified: Secondary | ICD-10-CM | POA: Diagnosis not present

## 2023-12-20 DIAGNOSIS — R293 Abnormal posture: Secondary | ICD-10-CM | POA: Diagnosis not present

## 2023-12-20 DIAGNOSIS — M542 Cervicalgia: Secondary | ICD-10-CM | POA: Diagnosis not present

## 2023-12-23 ENCOUNTER — Encounter: Payer: Self-pay | Admitting: Radiology

## 2023-12-23 DIAGNOSIS — M542 Cervicalgia: Secondary | ICD-10-CM | POA: Diagnosis not present

## 2023-12-23 DIAGNOSIS — M545 Low back pain, unspecified: Secondary | ICD-10-CM | POA: Diagnosis not present

## 2023-12-23 DIAGNOSIS — R293 Abnormal posture: Secondary | ICD-10-CM | POA: Diagnosis not present

## 2023-12-24 DIAGNOSIS — C44329 Squamous cell carcinoma of skin of other parts of face: Secondary | ICD-10-CM | POA: Diagnosis not present

## 2023-12-25 DIAGNOSIS — M545 Low back pain, unspecified: Secondary | ICD-10-CM | POA: Diagnosis not present

## 2023-12-25 DIAGNOSIS — M542 Cervicalgia: Secondary | ICD-10-CM | POA: Diagnosis not present

## 2023-12-25 DIAGNOSIS — R293 Abnormal posture: Secondary | ICD-10-CM | POA: Diagnosis not present

## 2023-12-26 DIAGNOSIS — H25013 Cortical age-related cataract, bilateral: Secondary | ICD-10-CM | POA: Diagnosis not present

## 2023-12-26 DIAGNOSIS — H43813 Vitreous degeneration, bilateral: Secondary | ICD-10-CM | POA: Diagnosis not present

## 2023-12-26 DIAGNOSIS — H04123 Dry eye syndrome of bilateral lacrimal glands: Secondary | ICD-10-CM | POA: Diagnosis not present

## 2023-12-26 DIAGNOSIS — H2513 Age-related nuclear cataract, bilateral: Secondary | ICD-10-CM | POA: Diagnosis not present

## 2023-12-30 ENCOUNTER — Ambulatory Visit: Admitting: Physician Assistant

## 2023-12-31 ENCOUNTER — Ambulatory Visit: Admitting: Physician Assistant

## 2023-12-31 DIAGNOSIS — D044 Carcinoma in situ of skin of scalp and neck: Secondary | ICD-10-CM | POA: Diagnosis not present

## 2024-01-03 DIAGNOSIS — M545 Low back pain, unspecified: Secondary | ICD-10-CM | POA: Diagnosis not present

## 2024-01-03 DIAGNOSIS — R293 Abnormal posture: Secondary | ICD-10-CM | POA: Diagnosis not present

## 2024-01-03 DIAGNOSIS — M542 Cervicalgia: Secondary | ICD-10-CM | POA: Diagnosis not present

## 2024-01-07 ENCOUNTER — Ambulatory Visit (INDEPENDENT_AMBULATORY_CARE_PROVIDER_SITE_OTHER): Admitting: Physician Assistant

## 2024-01-07 ENCOUNTER — Encounter: Payer: Self-pay | Admitting: Physician Assistant

## 2024-01-07 VITALS — BP 120/77 | HR 58 | Resp 14 | Ht 68.0 in | Wt 190.8 lb

## 2024-01-07 DIAGNOSIS — G8929 Other chronic pain: Secondary | ICD-10-CM | POA: Diagnosis not present

## 2024-01-07 DIAGNOSIS — I10 Essential (primary) hypertension: Secondary | ICD-10-CM | POA: Diagnosis not present

## 2024-01-07 DIAGNOSIS — K2100 Gastro-esophageal reflux disease with esophagitis, without bleeding: Secondary | ICD-10-CM

## 2024-01-07 DIAGNOSIS — M545 Low back pain, unspecified: Secondary | ICD-10-CM | POA: Diagnosis not present

## 2024-01-07 NOTE — Progress Notes (Unsigned)
 Established patient visit  Patient: Benjamin Gutierrez   DOB: 16-Apr-1950   73 y.o. Male  MRN: [id number] Visit Date: 01/07/2024  Today's healthcare provider: Jolynn Spencer, PA-C   Chief Complaint  Patient presents with   Follow-up    PT results still doing PT.  Reports helping a little but still having some issues. Saw a neurosurgeon in [place] an has another appt in [place] to get a second opinion. Like to discuss Celebrex , if he needs to continue on medication.   Subjective     HPI     Follow-up    Additional comments: PT results still doing PT.  Reports helping a little but still having some issues. Saw a neurosurgeon in [place] an has another appt in [place] to get a second opinion. Like to discuss Celebrex , if he needs to continue on medication.      Last edited by Wilfred Hargis RAMAN, CMA on 01/07/2024  9:57 AM.       Discussed the use of AI scribe software for clinical note transcription with the patient, who gave verbal consent to proceed.  History of Present Illness        12/09/2023   11:06 AM 09/17/2023    3:43 PM 07/01/2023   10:45 AM  Depression screen PHQ 2/9  Decreased Interest 0 0 0  Down, Depressed, Hopeless 0 0 0  PHQ - 2 Score 0 0 0  Altered sleeping 0 0 0  Tired, decreased energy 0 0 0  Change in appetite 0 0 0  Feeling bad or failure about yourself  0 0 0  Trouble concentrating 0 0 0  Moving slowly or fidgety/restless 0 0 0  Suicidal thoughts 0 0 0  PHQ-9 Score 0  0  0   Difficult doing work/chores  Not difficult at all Not difficult at all     Data saved with a previous flowsheet row definition      12/09/2023   11:06 AM 07/01/2023   10:45 AM  GAD 7 : Generalized Anxiety Score  Nervous, Anxious, on Edge 0 0  Control/stop worrying 0 0  Worry too much - different things 0 0  Trouble relaxing 0 0  Restless 0 0  Easily annoyed or irritable 0   Afraid - awful might happen 0 0  Total GAD 7 Score 0   Anxiety Difficulty  Not difficult at all     Medications: Outpatient Medications Prior to Visit  Medication Sig   acetaminophen  (TYLENOL ) 500 MG tablet Take 500 mg by mouth every 6 (six) hours as needed.   celecoxib  (CELEBREX ) 200 MG capsule Take 1 capsule (200 mg total) by mouth 2 (two) times daily.   fluticasone  (FLONASE ) 50 MCG/ACT nasal spray Place 2 sprays into both nostrils daily.   hydrochlorothiazide  (MICROZIDE ) 12.5 MG capsule Take 1 capsule (12.5 mg total) by mouth daily.   lansoprazole  (PREVACID ) 30 MG capsule Take 30 mg by mouth daily at 12 noon.   levocetirizine (XYZAL ) 5 MG tablet TAKE 1 TABLET BY MOUTH EVERY DAY IN THE EVENING (Patient taking differently: Take 5 mg by mouth daily as needed for allergies.)   metoprolol  tartrate (LOPRESSOR ) 25 MG tablet TAKE 1 TABLET BY MOUTH TWICE A DAY   valsartan  (DIOVAN ) 40 MG tablet Take 1 tablet (40 mg total) by mouth daily.   [DISCONTINUED] ezetimibe  (ZETIA ) 10 MG tablet Take 1 tablet (10 mg total) by mouth daily.   [DISCONTINUED] gabapentin  (NEURONTIN ) 300 MG capsule Take 1 tablet by mouth as  needed for pain   No facility-administered medications prior to visit.    Review of Systems All negative Except see HPI   {Insert previous labs (optional):23779} {See past labs  Heme  Chem  Endocrine  Serology  Results Review (optional):1}   Objective    BP 120/77   Pulse (!) 58   Resp 14   Ht 5' 8 (1.727 m)   Wt 190 lb 12.8 oz (86.5 kg)   SpO2 97%   BMI 29.01 kg/m  {Insert last BP/Wt (optional):23777}{See vitals history (optional):1}   Physical Exam   No results found for any visits on 01/07/24.      Assessment and Plan Assessment & Plan     No orders of the defined types were placed in this encounter.   No follow-ups on file.   The patient was advised to call back or seek an in-person evaluation if the symptoms worsen or if the condition fails to improve as anticipated.  I discussed the assessment and treatment plan with the patient. The patient was  provided an opportunity to ask questions and all were answered. The patient agreed with the plan and demonstrated an understanding of the instructions.  I, Powell Halbert, PA-C have reviewed all documentation for this visit. The documentation on 01/07/2024  for the exam, diagnosis, procedures, and orders are all accurate and complete.  Jolynn Spencer, [facility], MMS [facility] [phone] (phone) [phone] (fax)  [facility]

## 2024-01-08 DIAGNOSIS — G8929 Other chronic pain: Secondary | ICD-10-CM | POA: Insufficient documentation

## 2024-01-10 ENCOUNTER — Ambulatory Visit: Admitting: Physician Assistant

## 2024-01-14 DIAGNOSIS — M545 Low back pain, unspecified: Secondary | ICD-10-CM | POA: Diagnosis not present

## 2024-01-14 DIAGNOSIS — R293 Abnormal posture: Secondary | ICD-10-CM | POA: Diagnosis not present

## 2024-01-14 DIAGNOSIS — M542 Cervicalgia: Secondary | ICD-10-CM | POA: Diagnosis not present

## 2024-01-17 DIAGNOSIS — M542 Cervicalgia: Secondary | ICD-10-CM | POA: Diagnosis not present

## 2024-01-17 DIAGNOSIS — R293 Abnormal posture: Secondary | ICD-10-CM | POA: Diagnosis not present

## 2024-01-17 DIAGNOSIS — M545 Low back pain, unspecified: Secondary | ICD-10-CM | POA: Diagnosis not present

## 2024-01-21 DIAGNOSIS — M545 Low back pain, unspecified: Secondary | ICD-10-CM | POA: Diagnosis not present

## 2024-01-21 DIAGNOSIS — M542 Cervicalgia: Secondary | ICD-10-CM | POA: Diagnosis not present

## 2024-01-21 DIAGNOSIS — R293 Abnormal posture: Secondary | ICD-10-CM | POA: Diagnosis not present

## 2024-01-24 DIAGNOSIS — M545 Low back pain, unspecified: Secondary | ICD-10-CM | POA: Diagnosis not present

## 2024-01-24 DIAGNOSIS — R293 Abnormal posture: Secondary | ICD-10-CM | POA: Diagnosis not present

## 2024-01-24 DIAGNOSIS — M542 Cervicalgia: Secondary | ICD-10-CM | POA: Diagnosis not present

## 2024-01-24 NOTE — Progress Notes (Unsigned)
 Referring Physician:  Ostwalt, Janna, PA-C [address] [place],  [place] [address]  Primary Physician:  Gutierrez, Janna, PA-C  History of Present Illness: [name] has a history of right BBB, HTN, ASCVD, chronic bronchitis, GERD, benign essential tremor, prediabetes, mixed hyperlipidemia.   History of cervical surgery x 3. Has seen [facility] in the past and had surgeries with Dr. Malcolm.   Last seen by me on 12/12/23 for constant left sided neck pain. No arm pain. He has chronic weakness in left arm since his surgeries.   He is s/p ACDF C3-C5 and C7-T1, he is fused C3-C7. 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   He has been in PT for his cervical spine. He is here for follow up.     Get cervical CT? If no better?***  Get EMG if numbness/tingling in right hand is worse***   He has constant left sided neck pain x 2 years. No arm pain. He has numbness in right small and ring finger x years. He feels like he has weakness in both hands. He has chronic weakness in left arm since his surgeries. Pain is worse with riding lawnmower. Pain is better with heat and medications.   He recently was started on celebrex . He takes neurontin  prn.   He is in PT for his neck and lower back- first visit yesterday.   Tobacco use: Does not smoke.   Bowel/Bladder Dysfunction: none  Conservative measures:  Physical therapy:  PT initial eval at [facility] on 12/11/23 for neck and lower back *** Multimodal medical therapy including regular antiinflammatories: tylenol , gabapentin  Injections:  no epidural steroid injections  Past Surgery:  3 prior neck surgeries, 2005, 2009 and 2016-with Dr Benjamin Gutierrez JONELLE Benjamin Gutierrez has no symptoms of cervical myelopathy.  The symptoms are causing a significant impact on the patient's life.   Review of Systems:  A 10 point  review of systems is negative, except for the pertinent positives and negatives detailed in the HPI.  Past Medical History: Past Medical History:  Diagnosis Date   Acid reflux 08/06/2014   Allergy    Anxiety 08/06/2014   Arthritis    Blood in feces 08/06/2014   Brachial neuritis 01/02/2008   Cancer (HCC)    forehead   Decreased libido 08/06/2014   Dizziness and giddiness 08/06/2014   ED (erectile dysfunction) of organic origin 08/06/2014   GERD (gastroesophageal reflux disease)    H/O arthrodesis 08/06/2014   Hypertension    Leg varices 08/06/2014   Mixed hyperlipidemia 07/30/2023   Oxygen deficiency    CPAP   Sleep apnea Jan.  2024    Past Surgical History: Past Surgical History:  Procedure Laterality Date   CARDIAC CATHETERIZATION  1993   CERVICAL SPINE SURGERY  2009   radiculopathy from past fusion and arthritic changes   COLONOSCOPY  2013   COLONOSCOPY WITH PROPOFOL  N/A 06/07/2020   Procedure: COLONOSCOPY WITH PROPOFOL ;  Surgeon: Jinny Carmine, MD;  Location: [facility] ENDOSCOPY;  Service: Endoscopy;  Laterality: N/A;   ESOPHAGOGASTRODUODENOSCOPY ENDOSCOPY     EYE SURGERY  01/02/2018   right eye lid mole removed   LAMINECTOMY  2005   PAROTIDECTOMY Left 12/13/2016   Procedure: TOTAL PAROTIDECTOMY WITH FACIAL NERVE MONITORING;  Surgeon: Milissa Hamming, MD;  Location: [facility] ORS;  Service: ENT;  Laterality: Left;   SPINE SURGERY  2005  2009    2016   Neck surgery    Allergies: Allergies as of 01/30/2024 - Review Complete 01/07/2024  Allergen Reaction Noted   Betadine [povidone iodine] Itching 08/06/2014   Crestor  [rosuvastatin ] Other (See Comments) 08/10/2022   Fentanyl  Other (See Comments) 11/15/2016   Penicillins Rash 08/06/2014    Medications: Outpatient Encounter Medications as of 01/30/2024  Medication Sig   acetaminophen  (TYLENOL ) 500 MG tablet Take 500 mg by mouth every 6 (six) hours as needed.   celecoxib  (CELEBREX ) 200 MG capsule Take 1 capsule (200  mg total) by mouth 2 (two) times daily.   fluticasone  (FLONASE ) 50 MCG/ACT nasal spray Place 2 sprays into both nostrils daily.   hydrochlorothiazide  (MICROZIDE ) 12.5 MG capsule Take 1 capsule (12.5 mg total) by mouth daily.   lansoprazole  (PREVACID ) 30 MG capsule Take 30 mg by mouth daily at 12 noon.   levocetirizine (XYZAL ) 5 MG tablet TAKE 1 TABLET BY MOUTH EVERY DAY IN THE EVENING (Patient taking differently: Take 5 mg by mouth daily as needed for allergies.)   metoprolol  tartrate (LOPRESSOR ) 25 MG tablet TAKE 1 TABLET BY MOUTH TWICE A DAY   valsartan  (DIOVAN ) 40 MG tablet Take 1 tablet (40 mg total) by mouth daily.   No facility-administered encounter medications on file as of 01/30/2024.    Social History: Social History   Tobacco Use   Smoking status: Never   Smokeless tobacco: Never   Tobacco comments:    I have never been a smoker.  Vaping Use   Vaping status: Never Used  Substance Use Topics   Alcohol use: Never   Drug use: No    Family Medical History: Family History  Problem Relation Age of Onset   Esophageal cancer Mother    Arthritis Mother    Cancer Mother    Alcohol abuse Father    Prostate cancer Brother    Cancer Brother    Parkinson's disease Maternal Uncle    Dementia Paternal Grandmother     Physical Examination: There were no vitals filed for this visit.    Awake, alert, oriented to person, place, and time.  Speech is clear and fluent. Fund of knowledge is appropriate.   Cranial Nerves: Pupils equal round and reactive to light.  Facial tone is symmetric.    Well healed cervical incisions.   No posterior cervical tenderness. Mild tenderness in left trapezial region.   No abnormal lesions on exposed skin.   Strength: Side Biceps Triceps Deltoid Interossei Grip Wrist Ext. Wrist Flex.  R 5 5 5 5 5 5 5   L 5 5 5 5 5 4 4    Side Iliopsoas Quads Hamstring PF DF EHL  R 5 5 5 5 5 5   L 5 5 5 5 5 5    Reflexes are 2+ and symmetric at the biceps,  brachioradialis, patella and achilles.   Hoffman's is absent.  Clonus is not present.   Bilateral upper and lower extremity sensation is intact to light touch.     Gait is normal.     Medical Decision Making  Imaging: none  Assessment and Plan: Mr. Kitner has a history of cervical surgery x 3. Has seen [facility] in the past and had surgeries with Dr. Malcolm.   He has constant left sided neck pain x 2 years. No arm pain. He has numbness in right small and ring finger x years. He feels like he has weakness in both hands. He has chronic weakness in left arm since his  surgeries.   He is s/p ACDF C3-C5 and C7-T1, he is fused C3-C7. 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   Current left sided neck pain appears more myofascial in nature. He has some minimal weakness in left arm that is chronic.   Treatment options discussed with patient and following plan made:   - Agree with PT for cervical spine.  - Continue on celebrex  from other providers.  - If his pain gets worse or does not improve, consider further imaging such as cervical CT.  - Follow up with me in 6-8 weeks (after PT).  - Can also discuss possible EMG to evaluate numbness/tingling in right hand if it is bothersome.   I spent a total of *** minutes in face-to-face and non-face-to-face activities related to this patient's care today including review of outside records, review of imaging, review of symptoms, physical exam, discussion of differential diagnosis, discussion of treatment options, and documentation.   Glade Boys PA-C Dept. of Neurosurgery

## 2024-01-30 ENCOUNTER — Encounter: Payer: Self-pay | Admitting: Orthopedic Surgery

## 2024-01-30 ENCOUNTER — Ambulatory Visit: Admitting: Orthopedic Surgery

## 2024-01-30 VITALS — BP 132/78 | Ht 68.0 in | Wt 191.0 lb

## 2024-01-30 DIAGNOSIS — M47812 Spondylosis without myelopathy or radiculopathy, cervical region: Secondary | ICD-10-CM | POA: Diagnosis not present

## 2024-01-30 DIAGNOSIS — Z981 Arthrodesis status: Secondary | ICD-10-CM

## 2024-01-30 DIAGNOSIS — M4802 Spinal stenosis, cervical region: Secondary | ICD-10-CM

## 2024-01-30 DIAGNOSIS — M5021 Other cervical disc displacement,  high cervical region: Secondary | ICD-10-CM | POA: Diagnosis not present

## 2024-02-01 ENCOUNTER — Other Ambulatory Visit: Payer: Self-pay | Admitting: Physician Assistant

## 2024-02-01 DIAGNOSIS — K2100 Gastro-esophageal reflux disease with esophagitis, without bleeding: Secondary | ICD-10-CM

## 2024-02-04 ENCOUNTER — Other Ambulatory Visit: Payer: Self-pay | Admitting: Physician Assistant

## 2024-02-04 DIAGNOSIS — M542 Cervicalgia: Secondary | ICD-10-CM

## 2024-02-27 ENCOUNTER — Ambulatory Visit: Admitting: Pulmonary Disease

## 2024-03-02 ENCOUNTER — Ambulatory Visit: Admitting: Physician Assistant

## 2024-03-02 ENCOUNTER — Encounter: Payer: Self-pay | Admitting: Physician Assistant

## 2024-03-02 VITALS — BP 122/72 | HR 50 | Ht 68.0 in | Wt 195.1 lb

## 2024-03-02 DIAGNOSIS — R7303 Prediabetes: Secondary | ICD-10-CM

## 2024-03-02 DIAGNOSIS — I10 Essential (primary) hypertension: Secondary | ICD-10-CM | POA: Diagnosis not present

## 2024-03-02 DIAGNOSIS — K2100 Gastro-esophageal reflux disease with esophagitis, without bleeding: Secondary | ICD-10-CM | POA: Diagnosis not present

## 2024-03-02 DIAGNOSIS — E782 Mixed hyperlipidemia: Secondary | ICD-10-CM | POA: Diagnosis not present

## 2024-03-02 DIAGNOSIS — G8929 Other chronic pain: Secondary | ICD-10-CM | POA: Diagnosis not present

## 2024-03-02 DIAGNOSIS — M25512 Pain in left shoulder: Secondary | ICD-10-CM | POA: Diagnosis not present

## 2024-03-02 NOTE — Progress Notes (Signed)
 " Established patient visit  Patient: Benjamin Gutierrez   DOB: 08/06/1950   74 y.o. Male  MRN: [id number] Visit Date: 03/02/2024  Today's healthcare provider: Jolynn Spencer, PA-C   Chief Complaint  Patient presents with   Medical Management of Chronic Issues    2 month f/u   Subjective     HPI     Medical Management of Chronic Issues    Additional comments: 2 month f/u      Last edited by Wilfred Hargis RAMAN, CMA on 03/02/2024  8:16 AM.       Discussed the use of AI scribe software for clinical note transcription with the patient, who gave verbal consent to proceed.  History of Present Illness Benjamin Gutierrez is a 74 year old male who presents for follow-up on lower back pain and CPAP-related nasal issues.  He has chronic lower back pain and has been in physical therapy twice weekly since mid-November with partial improvement. He missed one recent session while out of town. A neurosurgeon advised monitoring and deferring surgery unless symptoms worsen.  He takes metoprolol  25 mg, valsartan  40 mg, and hydrochlorothiazide  12.5 mg daily for blood pressure, and takes Prevacid  daily for acid reflux.  He has used CPAP for sleep apnea for about a year and has had persistent nasal drainage since starting CPAP, which he attributes to the machine. He reports occasional shortness of breath that is unchanged from baseline. He denies dizziness, visual changes, palpitations, or chest pain. He previously had muscle cramps with cholesterol medications and is not taking any cholesterol-lowering therapy.       12/09/2023   11:06 AM 09/17/2023    3:43 PM 07/01/2023   10:45 AM  Depression screen PHQ 2/9  Decreased Interest 0 0 0  Down, Depressed, Hopeless 0 0 0  PHQ - 2 Score 0 0 0  Altered sleeping 0 0 0  Tired, decreased energy 0 0 0  Change in appetite 0 0 0  Feeling bad or failure about yourself  0 0 0  Trouble concentrating 0 0 0  Moving slowly or fidgety/restless 0 0 0   Suicidal thoughts 0 0 0  PHQ-9 Score 0  0  0   Difficult doing work/chores  Not difficult at all Not difficult at all     Data saved with a previous flowsheet row definition      12/09/2023   11:06 AM 07/01/2023   10:45 AM  GAD 7 : Generalized Anxiety Score  Nervous, Anxious, on Edge 0 0  Control/stop worrying 0 0  Worry too much - different things 0 0  Trouble relaxing 0 0  Restless 0 0  Easily annoyed or irritable 0   Afraid - awful might happen 0 0  Total GAD 7 Score 0   Anxiety Difficulty  Not difficult at all    Medications: Show/hide medication list[1]  Review of Systems All negative Except see HPI       Objective    BP 122/72   Pulse (!) 50   Ht 5' 8 (1.727 m)   Wt 195 lb 1.6 oz (88.5 kg)   SpO2 98%   BMI 29.66 kg/m     Physical Exam Vitals reviewed.  Constitutional:      General: He is not in acute distress.    Appearance: Normal appearance. He is not diaphoretic.  HENT:     Head: Normocephalic and atraumatic.  Eyes:     General: No scleral icterus.  Conjunctiva/sclera: Conjunctivae normal.  Cardiovascular:     Rate and Rhythm: Normal rate and regular rhythm.     Pulses: Normal pulses.     Heart sounds: Normal heart sounds. No murmur heard. Pulmonary:     Effort: Pulmonary effort is normal. No respiratory distress.     Breath sounds: Normal breath sounds. No wheezing or rhonchi.  Musculoskeletal:     Cervical back: Neck supple.     Right lower leg: No edema.     Left lower leg: No edema.  Lymphadenopathy:     Cervical: No cervical adenopathy.  Skin:    General: Skin is warm and dry.     Findings: No rash.  Neurological:     Mental Status: He is alert and oriented to person, place, and time. Mental status is at baseline.  Psychiatric:        Mood and Affect: Mood normal.        Behavior: Behavior normal.      No results found for any visits on 03/02/24.      Assessment & Plan Chronic shoulder pain and cervical  radiculopathy Improvement with physical therapy. Neurosurgeon advised waiting for worsening symptoms before surgery. - Continue physical therapy as needed. - Follow up with neurosurgeon if symptoms worsen.  Primary hypertension Chronic and stable  well-controlled with current medication regimen. - Continue current antihypertensive medications: metoprolol , valsartan , and hydrochlorothiazide . Continue lifestyle modifications Will follow-up  Prediabetes Previous lab results indicated prediabetes. - Ordered blood work to monitor glucose levels. Advise low-carb diet and regular exercise Will follow-up  Mixed hyperlipidemia Chronic  previous contraindication to statins. Discussion of alternative treatments pending lab results. - Ordered blood work to assess lipid levels. - Will discuss alternative cholesterol management options after lab results. Advise low-cholesterol diet, regular exercise Will follow-up  Gastroesophageal reflux disease Chronic  managed with Prevacid . Advised to avoid Celebrex  due to GI bleeding risk. - Continue Prevacid  for acid reflux. - Avoid Celebrex ; use Tylenol  for pain management. - Take medications with food to prevent GI issues. Will follow-up  Primary hypertension (Primary)  - Comprehensive metabolic panel with GFR - Hemoglobin A1c - Lipid Panel With LDL/HDL Ratio  Prediabetes  - Comprehensive metabolic panel with GFR - Hemoglobin A1c - Lipid Panel With LDL/HDL Ratio  Mixed hyperlipidemia  - Comprehensive metabolic panel with GFR - Hemoglobin A1c - Lipid Panel With LDL/HDL Ratio   No orders of the defined types were placed in this encounter.   No follow-ups on file.   The patient was advised to call back or seek an in-person evaluation if the symptoms worsen or if the condition fails to improve as anticipated.  I discussed the assessment and treatment plan with the patient. The patient was provided an opportunity to ask questions and  all were answered. The patient agreed with the plan and demonstrated an understanding of the instructions.  I, Keionte Swicegood, PA-C have reviewed all documentation for this visit. The documentation on 03/02/2024  for the exam, diagnosis, procedures, and orders are all accurate and complete.  Jolynn Spencer, [facility], MMS [facility] [phone] (phone) [phone] (fax)  [facility]     [1]  Outpatient Medications Prior to Visit  Medication Sig   acetaminophen  (TYLENOL ) 500 MG tablet Take 500 mg by mouth every 6 (six) hours as needed.   celecoxib  (CELEBREX ) 200 MG capsule TAKE 1 CAPSULE BY MOUTH TWICE A DAY   fluticasone  (FLONASE ) 50 MCG/ACT nasal spray Place 2 sprays into both nostrils  daily.   hydrochlorothiazide  (MICROZIDE ) 12.5 MG capsule Take 1 capsule (12.5 mg total) by mouth daily.   lansoprazole  (PREVACID ) 30 MG capsule TAKE 1 CAPSULE (30 MG TOTAL) BY MOUTH DAILY AT 12 NOON.   levocetirizine (XYZAL ) 5 MG tablet TAKE 1 TABLET BY MOUTH EVERY DAY IN THE EVENING (Patient taking differently: Take 5 mg by mouth daily as needed for allergies.)   metoprolol  tartrate (LOPRESSOR ) 25 MG tablet TAKE 1 TABLET BY MOUTH TWICE A DAY   valsartan  (DIOVAN ) 40 MG tablet Take 1 tablet (40 mg total) by mouth daily.   No facility-administered medications prior to visit.   "

## 2024-03-03 LAB — COMPREHENSIVE METABOLIC PANEL WITH GFR
ALT: 22 IU/L (ref 0–44)
AST: 21 IU/L (ref 0–40)
Albumin: 4.4 g/dL (ref 3.8–4.8)
Alkaline Phosphatase: 63 IU/L (ref 47–123)
BUN/Creatinine Ratio: 15 (ref 10–24)
BUN: 19 mg/dL (ref 8–27)
Bilirubin Total: 0.8 mg/dL (ref 0.0–1.2)
CO2: 27 mmol/L (ref 20–29)
Calcium: 9.7 mg/dL (ref 8.6–10.2)
Chloride: 103 mmol/L (ref 96–106)
Creatinine, Ser: 1.25 mg/dL (ref 0.76–1.27)
Globulin, Total: 2.3 g/dL (ref 1.5–4.5)
Glucose: 103 mg/dL — ABNORMAL HIGH (ref 70–99)
Potassium: 4.7 mmol/L (ref 3.5–5.2)
Sodium: 143 mmol/L (ref 134–144)
Total Protein: 6.7 g/dL (ref 6.0–8.5)
eGFR: 60 mL/min/1.73

## 2024-03-03 LAB — LIPID PANEL WITH LDL/HDL RATIO
Cholesterol, Total: 166 mg/dL (ref 100–199)
HDL: 47 mg/dL
LDL Chol Calc (NIH): 102 mg/dL — ABNORMAL HIGH (ref 0–99)
LDL/HDL Ratio: 2.2 ratio (ref 0.0–3.6)
Triglycerides: 89 mg/dL (ref 0–149)
VLDL Cholesterol Cal: 17 mg/dL (ref 5–40)

## 2024-03-03 LAB — HEMOGLOBIN A1C
Est. average glucose Bld gHb Est-mCnc: 120 mg/dL
Hgb A1c MFr Bld: 5.8 % — ABNORMAL HIGH (ref 4.8–5.6)

## 2024-03-04 ENCOUNTER — Other Ambulatory Visit: Payer: Self-pay | Admitting: Physician Assistant

## 2024-03-04 DIAGNOSIS — M542 Cervicalgia: Secondary | ICD-10-CM

## 2024-03-06 ENCOUNTER — Ambulatory Visit: Payer: Self-pay | Admitting: Physician Assistant

## 2024-03-06 DIAGNOSIS — E782 Mixed hyperlipidemia: Secondary | ICD-10-CM

## 2024-03-09 ENCOUNTER — Other Ambulatory Visit: Payer: Self-pay

## 2024-03-09 MED ORDER — EZETIMIBE 10 MG PO TABS: 10.0000 mg | ORAL_TABLET | Freq: Every day | ORAL | 3 refills | Status: AC

## 2024-03-09 NOTE — Progress Notes (Signed)
 Please, order if pt agrees: Zetia  10mg  once a day, #90 refill 0

## 2024-03-19 ENCOUNTER — Other Ambulatory Visit: Payer: Self-pay | Admitting: Physician Assistant

## 2024-03-19 ENCOUNTER — Encounter: Payer: Self-pay | Admitting: Physician Assistant

## 2024-03-19 DIAGNOSIS — R251 Tremor, unspecified: Secondary | ICD-10-CM

## 2024-03-19 DIAGNOSIS — R000 Tachycardia, unspecified: Secondary | ICD-10-CM

## 2024-03-20 MED ORDER — METOPROLOL TARTRATE 25 MG PO TABS: 25.0000 mg | ORAL_TABLET | Freq: Two times a day (BID) | ORAL | 1 refills | Status: AC

## 2024-05-12 ENCOUNTER — Ambulatory Visit: Admitting: Pulmonary Disease

## 2024-06-01 ENCOUNTER — Ambulatory Visit: Admitting: Physician Assistant

## 2024-06-24 ENCOUNTER — Ambulatory Visit: Admitting: Urology

## 2024-06-25 ENCOUNTER — Ambulatory Visit: Admitting: Urology

## 2024-09-22 ENCOUNTER — Ambulatory Visit
